# Patient Record
Sex: Male | Born: 1960 | Race: White | Hispanic: No | Marital: Married | Smoking: Current every day smoker
Health system: Southern US, Community
[De-identification: ages and names within clinical notes are randomized; demographics above are authoritative.]

## PROBLEM LIST (undated history)

## (undated) DIAGNOSIS — D649 Anemia, unspecified: Secondary | ICD-10-CM

## (undated) DIAGNOSIS — N62 Hypertrophy of breast: Secondary | ICD-10-CM

## (undated) DIAGNOSIS — R188 Other ascites: Secondary | ICD-10-CM

## (undated) DIAGNOSIS — B029 Zoster without complications: Secondary | ICD-10-CM

## (undated) DIAGNOSIS — C8198 Hodgkin lymphoma, unspecified, lymph nodes of multiple sites: Secondary | ICD-10-CM

## (undated) DIAGNOSIS — R918 Other nonspecific abnormal finding of lung field: Secondary | ICD-10-CM

## (undated) DIAGNOSIS — R945 Abnormal results of liver function studies: Secondary | ICD-10-CM

## (undated) DIAGNOSIS — I509 Heart failure, unspecified: Secondary | ICD-10-CM

## (undated) DIAGNOSIS — K920 Hematemesis: Secondary | ICD-10-CM

## (undated) DIAGNOSIS — N179 Acute kidney failure, unspecified: Secondary | ICD-10-CM

## (undated) DIAGNOSIS — R221 Localized swelling, mass and lump, neck: Secondary | ICD-10-CM

## (undated) DIAGNOSIS — R160 Hepatomegaly, not elsewhere classified: Secondary | ICD-10-CM

## (undated) DIAGNOSIS — K5730 Diverticulosis of large intestine without perforation or abscess without bleeding: Secondary | ICD-10-CM

## (undated) DIAGNOSIS — F1010 Alcohol abuse, uncomplicated: Secondary | ICD-10-CM

## (undated) DIAGNOSIS — M47812 Spondylosis without myelopathy or radiculopathy, cervical region: Secondary | ICD-10-CM

## (undated) HISTORY — PX: TONSILLECTOMY: SUR1361

---

## 2014-03-30 DIAGNOSIS — B029 Zoster without complications: Secondary | ICD-10-CM

## 2014-03-30 HISTORY — DX: Zoster without complications: B02.9

## 2014-04-20 ENCOUNTER — Ambulatory Visit (INDEPENDENT_AMBULATORY_CARE_PROVIDER_SITE_OTHER): Payer: BC Managed Care – PPO | Admitting: Physician Assistant

## 2014-04-20 VITALS — BP 128/76 | HR 93 | Temp 98.5°F | Resp 18 | Ht 73.0 in | Wt 189.8 lb

## 2014-04-20 DIAGNOSIS — B029 Zoster without complications: Secondary | ICD-10-CM

## 2014-04-20 MED ORDER — VALACYCLOVIR HCL 1 G PO TABS: 1000.0000 mg | ORAL_TABLET | Freq: Three times a day (TID) | ORAL | Status: DC

## 2014-04-20 NOTE — Progress Notes (Signed)
   Subjective:    Patient ID: Paul Morton, male    DOB: Aug 21, 1961, 53 y.o.   MRN: [id number]  HPI   Paul Morton is a very pleasant 53 yr old male here with concern for shingles.  Reports he had a "funny feeling" at the back of his neck 3 days ago - had been working outside over the weekend, thought perhaps his scraped something or had been bitten.  Two days ago he noted a splotchy red rash.  Yesterday blisters began to develop.  The area occ has burning pain but is not extremely painful.  He has not taken anything for pain.  He denies itching.  Rash does not cross the midline.  He denies fever or chills.  Feels under the weather, but no specific symptoms.     Review of Systems  Constitutional: Negative for fever and chills.  Respiratory: Negative.   Cardiovascular: Negative.   Gastrointestinal: Negative.   Skin: Positive for color change and rash.       Objective:   Physical Exam  Vitals reviewed. Constitutional: He is oriented to person, place, and time. He appears well-developed and well-nourished. No distress.  HENT:  Head: Normocephalic and atraumatic.  Eyes: Conjunctivae are normal. No scleral icterus.  Cardiovascular: Normal rate, regular rhythm and normal heart sounds.   Pulmonary/Chest: Effort normal and breath sounds normal. He has no wheezes. He has no rales.  Abdominal: Soft. Bowel sounds are normal. There is no tenderness. There is no rebound.  Neurological: He is alert and oriented to person, place, and time.  Skin: Skin is warm and dry. Rash noted. Rash is vesicular.     Noted area with numerous vesicular lesions on an erythematous base; does not cross the midline; appears c/w with zoster involving C3, C4, T1-2  Psychiatric: He has a normal mood and affect. His behavior is normal.       Assessment & Plan:  Shingles - Plan: valACYclovir (VALTREX) 1000 MG tablet   Paul Morton is a very pleasant 53 yr old male here with shingles.  Will start Valtrex 1g TID.  At this  point, he has good relief of pain with Aleve only - will continue with otc meds.  If becoming, more painful, can prescribe something stronger.  We discussed that the fluid within the vesicles is infectious - he will keep the rash covered until it is crusted over.    Pt to call or RTC if worsening or not improving  E. Natividad Brood MHS, PA-C [facility] 4/22/20159:52 PM

## 2014-04-20 NOTE — Patient Instructions (Signed)
Begin taking the Valtrex as directed.  Be sure to finish the full course.  This is an antiviral medicine that will hopefully shorten the duration of the rash  Continue using Aleve and/or Tylenol for pain relief.  If you need something stronger, please let me know  As we discussed, the fluid inside the blisters is infectious to someone who has never been exposed to chicken pox.  Once the blisters are crusted/scabbed over, they are no longer contagious  The rash will typically be crusted over in 7-10 days.  It may take a little longer than than for it to fully resolve  If you are worsening, not improving, or have other concerns, please let us know   Shingles Shingles (herpes zoster) is an infection that is caused by the same virus that causes chickenpox (varicella). The infection causes a painful skin rash and fluid-filled blisters, which eventually break open, crust over, and heal. It may occur in any area of the body, but it usually affects only one side of the body or face. The pain of shingles usually lasts about 1 month. However, some people with shingles may develop long-term (chronic) pain in the affected area of the body. Shingles often occurs many years after the person had chickenpox. It is more common:  In people older than 50 years.  In people with weakened immune systems, such as those with HIV, AIDS, or cancer.  In people taking medicines that weaken the immune system, such as transplant medicines.  In people under great stress. CAUSES  Shingles is caused by the varicella zoster virus (VZV), which also causes chickenpox. After a person is infected with the virus, it can remain in the person's body for years in an inactive state (dormant). To cause shingles, the virus reactivates and breaks out as an infection in a nerve root. The virus can be spread from person to person (contagious) through contact with open blisters of the shingles rash. It will only spread to people who have  not had chickenpox. When these people are exposed to the virus, they may develop chickenpox. They will not develop shingles. Once the blisters scab over, the person is no longer contagious and cannot spread the virus to others. SYMPTOMS  Shingles shows up in stages. The initial symptoms may be pain, itching, and tingling in an area of the skin. This pain is usually described as burning, stabbing, or throbbing.In a few days or weeks, a painful red rash will appear in the area where the pain, itching, and tingling were felt. The rash is usually on one side of the body in a band or belt-like pattern. Then, the rash usually turns into fluid-filled blisters. They will scab over and dry up in approximately 2 3 weeks. Flu-like symptoms may also occur with the initial symptoms, the rash, or the blisters. These may include:  Fever.  Chills.  Headache.  Upset stomach. DIAGNOSIS  Your caregiver will perform a skin exam to diagnose shingles. Skin scrapings or fluid samples may also be taken from the blisters. This sample will be examined under a microscope or sent to a lab for further testing. TREATMENT  There is no specific cure for shingles. Your caregiver will likely prescribe medicines to help you manage the pain, recover faster, and avoid long-term problems. This may include antiviral drugs, anti-inflammatory drugs, and pain medicines. HOME CARE INSTRUCTIONS   Take a cool bath or apply cool compresses to the area of the rash or blisters as directed. This may help  with the pain and itching.   Only take over-the-counter or prescription medicines as directed by your caregiver.   Rest as directed by your caregiver.  Keep your rash and blisters clean with mild soap and cool water or as directed by your caregiver.  Do not pick your blisters or scratch your rash. Apply an anti-itch cream or numbing creams to the affected area as directed by your caregiver.  Keep your shingles rash covered with a  loose bandage (dressing).  Avoid skin contact with:  Babies.   Pregnant women.   Children with eczema.   Elderly people with transplants.   People with chronic illnesses, such as leukemia or AIDS.   Wear loose-fitting clothing to help ease the pain of material rubbing against the rash.  Keep all follow-up appointments with your caregiver.If the area involved is on your face, you may receive a referral for follow-up to a specialist, such as an eye doctor (ophthalmologist) or an ear, nose, and throat (ENT) doctor. Keeping all follow-up appointments will help you avoid eye complications, chronic pain, or disability.  SEEK IMMEDIATE MEDICAL CARE IF:   You have facial pain, pain around the eye area, or loss of feeling on one side of your face.  You have ear pain or ringing in your ear.  You have loss of taste.  Your pain is not relieved with prescribed medicines.   Your redness or swelling spreads.   You have more pain and swelling.  Your condition is worsening or has changed.   You have a feveror persistent symptoms for more than 2 3 days.  You have a fever and your symptoms suddenly get worse. MAKE SURE YOU:  Understand these instructions.  Will watch your condition.  Will get help right away if you are not doing well or get worse. Document Released: 12/16/2005 Document Revised: 09/09/2012 Document Reviewed: 07/30/2012 [facility] Patient Information 2014 [place].

## 2015-05-31 DIAGNOSIS — R221 Localized swelling, mass and lump, neck: Secondary | ICD-10-CM

## 2015-05-31 HISTORY — DX: Localized swelling, mass and lump, neck: R22.1

## 2015-08-31 DIAGNOSIS — R7989 Other specified abnormal findings of blood chemistry: Secondary | ICD-10-CM

## 2015-08-31 DIAGNOSIS — I509 Heart failure, unspecified: Secondary | ICD-10-CM

## 2015-08-31 DIAGNOSIS — K5730 Diverticulosis of large intestine without perforation or abscess without bleeding: Secondary | ICD-10-CM

## 2015-08-31 DIAGNOSIS — N179 Acute kidney failure, unspecified: Secondary | ICD-10-CM

## 2015-08-31 DIAGNOSIS — R188 Other ascites: Secondary | ICD-10-CM

## 2015-08-31 DIAGNOSIS — D649 Anemia, unspecified: Secondary | ICD-10-CM

## 2015-08-31 DIAGNOSIS — N62 Hypertrophy of breast: Secondary | ICD-10-CM

## 2015-08-31 DIAGNOSIS — R160 Hepatomegaly, not elsewhere classified: Secondary | ICD-10-CM

## 2015-08-31 DIAGNOSIS — F1010 Alcohol abuse, uncomplicated: Secondary | ICD-10-CM

## 2015-08-31 DIAGNOSIS — M47812 Spondylosis without myelopathy or radiculopathy, cervical region: Secondary | ICD-10-CM

## 2015-08-31 DIAGNOSIS — R918 Other nonspecific abnormal finding of lung field: Secondary | ICD-10-CM

## 2015-08-31 HISTORY — DX: Acute kidney failure, unspecified: N17.9

## 2015-08-31 HISTORY — DX: Hypertrophy of breast: N62

## 2015-08-31 HISTORY — DX: Spondylosis without myelopathy or radiculopathy, cervical region: M47.812

## 2015-08-31 HISTORY — DX: Hepatomegaly, not elsewhere classified: R16.0

## 2015-08-31 HISTORY — DX: Other ascites: R18.8

## 2015-08-31 HISTORY — DX: Anemia, unspecified: D64.9

## 2015-08-31 HISTORY — DX: Alcohol abuse, uncomplicated: F10.10

## 2015-08-31 HISTORY — DX: Other nonspecific abnormal finding of lung field: R91.8

## 2015-08-31 HISTORY — DX: Other specified abnormal findings of blood chemistry: R79.89

## 2015-08-31 HISTORY — DX: Heart failure, unspecified: I50.9

## 2015-08-31 HISTORY — DX: Diverticulosis of large intestine without perforation or abscess without bleeding: K57.30

## 2015-09-08 ENCOUNTER — Other Ambulatory Visit: Payer: Self-pay | Admitting: Hematology & Oncology

## 2015-09-08 ENCOUNTER — Ambulatory Visit (INDEPENDENT_AMBULATORY_CARE_PROVIDER_SITE_OTHER): Payer: BLUE CROSS/BLUE SHIELD | Admitting: Emergency Medicine

## 2015-09-08 ENCOUNTER — Ambulatory Visit (HOSPITAL_COMMUNITY)
Admission: RE | Admit: 2015-09-08 | Discharge: 2015-09-08 | Disposition: A | Discharge status: Home / Self Care | Payer: BLUE CROSS/BLUE SHIELD | Source: Ambulatory Visit | Attending: Emergency Medicine | Admitting: Emergency Medicine

## 2015-09-08 ENCOUNTER — Encounter (HOSPITAL_COMMUNITY): Payer: Self-pay

## 2015-09-08 VITALS — BP 116/82 | HR 127 | Temp 99.1°F | Resp 18 | Ht 72.0 in | Wt 199.6 lb

## 2015-09-08 DIAGNOSIS — R221 Localized swelling, mass and lump, neck: Secondary | ICD-10-CM

## 2015-09-08 DIAGNOSIS — R222 Localized swelling, mass and lump, trunk: Secondary | ICD-10-CM

## 2015-09-08 DIAGNOSIS — S335XXA Sprain of ligaments of lumbar spine, initial encounter: Secondary | ICD-10-CM

## 2015-09-08 DIAGNOSIS — J9859 Other diseases of mediastinum, not elsewhere classified: Secondary | ICD-10-CM

## 2015-09-08 LAB — COMPREHENSIVE METABOLIC PANEL
ALBUMIN: 3.2 g/dL — AB (ref 3.6–5.1)
ALT: 337 U/L — ABNORMAL HIGH (ref 9–46)
AST: 302 U/L — ABNORMAL HIGH (ref 10–35)
Alkaline Phosphatase: 528 U/L — ABNORMAL HIGH (ref 40–115)
BUN: 17 mg/dL (ref 7–25)
CHLORIDE: 102 mmol/L (ref 98–110)
CO2: 25 mmol/L (ref 20–31)
Calcium: 9.3 mg/dL (ref 8.6–10.3)
Creat: 1 mg/dL (ref 0.70–1.33)
Glucose, Bld: 102 mg/dL — ABNORMAL HIGH (ref 65–99)
Potassium: 5.2 mmol/L (ref 3.5–5.3)
SODIUM: 136 mmol/L (ref 135–146)
Total Bilirubin: 1.3 mg/dL — ABNORMAL HIGH (ref 0.2–1.2)
Total Protein: 6.6 g/dL (ref 6.1–8.1)

## 2015-09-08 LAB — POCT CBC
GRANULOCYTE PERCENT: 83.8 % — AB (ref 37–80)
HEMATOCRIT: 32.3 % — AB (ref 43.5–53.7)
Hemoglobin: 10.1 g/dL — AB (ref 14.1–18.1)
Lymph, poc: 1.7 (ref 0.6–3.4)
MCH, POC: 24.3 pg — AB (ref 27–31.2)
MCHC: 31.2 g/dL — AB (ref 31.8–35.4)
MCV: 78 fL — AB (ref 80–97)
MID (cbc): 0.8 (ref 0–0.9)
MPV: 6.5 fL (ref 0–99.8)
POC GRANULOCYTE: 12.5 — AB (ref 2–6.9)
POC LYMPH PERCENT: 11.1 %L (ref 10–50)
POC MID %: 5.1 %M (ref 0–12)
Platelet Count, POC: 716 10*3/uL — AB (ref 142–424)
RBC: 4.14 M/uL — AB (ref 4.69–6.13)
RDW, POC: 17.5 %
WBC: 14.9 10*3/uL — AB (ref 4.6–10.2)

## 2015-09-08 LAB — TSH: TSH: 3.279 u[IU]/mL (ref 0.350–4.500)

## 2015-09-08 MED ORDER — IOHEXOL 300 MG/ML  SOLN
75.0000 mL | Freq: Once | INTRAMUSCULAR | Status: AC | PRN
  Administered 2015-09-08: 75 mL via INTRAVENOUS

## 2015-09-08 MED ORDER — CYCLOBENZAPRINE HCL 10 MG PO TABS: 10.0000 mg | ORAL_TABLET | Freq: Three times a day (TID) | ORAL | Status: AC | PRN

## 2015-09-08 MED ORDER — EPINEPHRINE 0.3 MG/0.3ML IJ SOAJ: 0.3000 mg | Freq: Once | INTRAMUSCULAR | Status: AC

## 2015-09-08 MED ORDER — TRAMADOL HCL 50 MG PO TABS: 50.0000 mg | ORAL_TABLET | Freq: Three times a day (TID) | ORAL | Status: AC | PRN

## 2015-09-08 MED ORDER — NAPROXEN SODIUM 550 MG PO TABS: 550.0000 mg | ORAL_TABLET | Freq: Two times a day (BID) | ORAL | Status: AC

## 2015-09-08 NOTE — Progress Notes (Signed)
Subjective:  Patient ID: Paul Morton, male    DOB: 03-Jul-1961  Age: 54 y.o. MRN: [id number]  CC: Back Pain and Mass   HPI [name] presents  with low back pain over the last month. He has no numbness tingling or weakness. No radiation of pain. He has no history of acute injury or overuse. No history of back pain previously. He has no incontinency or stool.  "Oh by the way "I have a neck mass. This been present for the last 3 months. Is not tender or painful. Seems to be enlarged. No fever chills no weight loss.  History [name] has a past medical history of Allergy.   He has no past surgical history on file.   His  family history is not on file.  He   reports that he has been smoking Cigarettes.  He has been smoking about 1.00 pack per day. He does not have any smokeless tobacco history on file. He reports that he drinks alcohol. He reports that he does not use illicit drugs.  Outpatient Prescriptions Prior to Visit  Medication Sig Dispense Refill  . Naproxen Sodium (ALEVE PO) Take by mouth.    . valACYclovir (VALTREX) 1000 MG tablet Take 1 tablet (1,000 mg total) by mouth 3 (three) times daily. (Patient not taking: Reported on 09/08/2015) 21 tablet 0   No facility-administered medications prior to visit.    Social History   Social History  . Marital Status: Married    Spouse Name: N/A  . Number of Children: N/A  . Years of Education: N/A   Social History Main Topics  . Smoking status: Current Every Day Smoker -- 1.00 packs/day    Types: Cigarettes  . Smokeless tobacco: None  . Alcohol Use: Yes  . Drug Use: No  . Sexual Activity: Not Asked   Other Topics Concern  . None   Social History Narrative     Review of Systems  Constitutional: Negative for fever, chills and appetite change.  HENT: Negative for congestion, ear pain, postnasal drip, sinus pressure and sore throat.   Eyes: Negative for pain and redness.  Respiratory: Negative for cough,  shortness of breath and wheezing.   Cardiovascular: Negative for leg swelling.  Gastrointestinal: Negative for nausea, vomiting, abdominal pain, diarrhea, constipation and blood in stool.  Endocrine: Negative for polyuria.  Genitourinary: Negative for dysuria, urgency, frequency and flank pain.  Musculoskeletal: Positive for back pain. Negative for gait problem.  Skin: Negative for rash.  Neurological: Negative for weakness and headaches.  Psychiatric/Behavioral: Negative for confusion and decreased concentration. The patient is not nervous/anxious.     Objective:  BP 116/82 mmHg  Pulse 127  Temp(Src) 99.1 F (37.3 C) (Oral)  Resp 18  Ht 6' (1.829 m)  Wt 199 lb 9.6 oz (90.538 kg)  BMI 27.06 kg/m2  SpO2 98%  Physical Exam  Constitutional: He is oriented to person, place, and time. He appears well-developed and well-nourished.  HENT:  Head: Normocephalic and atraumatic.  Eyes: Conjunctivae are normal. Pupils are equal, round, and reactive to light.  Neck: Normal range of motion and phonation normal. Neck supple. Carotid bruit is not present. No thyroid mass and no thyromegaly present.  He has a grapefruit size firm mass that is not fixed to the underlying tissues were fixed to the skin in his left supraclavicular fossa and lateral base of the neck. It is not pulsatile. Is not tender. Multilobular in character. Seems to arise from the supraclavicular  fossa  Pulmonary/Chest: Effort normal.  Musculoskeletal: He exhibits no edema.  Neurological: He is alert and oriented to person, place, and time.  Skin: Skin is dry.  Psychiatric: He has a normal mood and affect. His behavior is normal. Thought content normal.      Assessment & Plan:   Ryder was seen today for back pain and mass.  Diagnoses and all orders for this visit:  Neck mass -     CT Soft Tissue Neck W Contrast; Future -     POCT CBC -     Comprehensive metabolic panel -     TSH  Sprain of lumbar region, initial  encounter  Other orders -     EPINEPHrine 0.3 mg/0.3 mL IJ SOAJ injection; Inject 0.3 mLs (0.3 mg total) into the muscle once. -     naproxen sodium (ANAPROX DS) 550 MG tablet; Take 1 tablet (550 mg total) by mouth 2 (two) times daily with a meal. -     cyclobenzaprine (FLEXERIL) 10 MG tablet; Take 1 tablet (10 mg total) by mouth 3 (three) times daily as needed for muscle spasms. -     traMADol (ULTRAM) 50 MG tablet; Take 1 tablet (50 mg total) by mouth every 8 (eight) hours as needed.   I am having [name] start on EPINEPHrine, naproxen sodium, cyclobenzaprine, and traMADol. I am also having him maintain his Naproxen Sodium (ALEVE PO) and valACYclovir.  Meds ordered this encounter  Medications  . EPINEPHrine 0.3 mg/0.3 mL IJ SOAJ injection    Sig: Inject 0.3 mLs (0.3 mg total) into the muscle once.    Dispense:  2 Device    Refill:  2  . naproxen sodium (ANAPROX DS) 550 MG tablet    Sig: Take 1 tablet (550 mg total) by mouth 2 (two) times daily with a meal.    Dispense:  40 tablet    Refill:  0  . cyclobenzaprine (FLEXERIL) 10 MG tablet    Sig: Take 1 tablet (10 mg total) by mouth 3 (three) times daily as needed for muscle spasms.    Dispense:  30 tablet    Refill:  0  . traMADol (ULTRAM) 50 MG tablet    Sig: Take 1 tablet (50 mg total) by mouth every 8 (eight) hours as needed.    Dispense:  40 tablet    Refill:  0   I discussed the case with Dr. Burney Gauze who said that he would communicate with the patient directly and see him Monday in the office. I did talk with the patient prior to talking with the oncologist and informed him of the results of the testing and that we be doing a referral he was amenable but concerned about the diagnosis.  Appropriate red flag conditions were discussed with the patient as well as actions that should be taken.  Patient expressed his understanding.  Follow-up: Return if symptoms worsen or fail to improve.  Roselee Culver,  MD   Results for orders placed or performed in visit on 09/08/15  POCT CBC  Result Value Ref Range   WBC 14.9 (A) 4.6 - 10.2 K/uL   Lymph, poc 1.7 0.6 - 3.4   POC LYMPH PERCENT 11.1 10 - 50 %L   MID (cbc) 0.8 0 - 0.9   POC MID % 5.1 0 - 12 %M   POC Granulocyte 12.5 (A) 2 - 6.9   Granulocyte percent 83.8 (A) 37 - 80 %G   RBC 4.14 (A)  4.69 - 6.13 M/uL   Hemoglobin 10.1 (A) 14.1 - 18.1 g/dL   HCT, POC 32.3 (A) 43.5 - 53.7 %   MCV 78.0 (A) 80 - 97 fL   MCH, POC 24.3 (A) 27 - 31.2 pg   MCHC 31.2 (A) 31.8 - 35.4 g/dL   RDW, POC 17.5 %   Platelet Count, POC 716 (A) 142 - 424 K/uL   MPV 6.5 0 - 99.8 fL

## 2015-09-08 NOTE — Patient Instructions (Addendum)
Go for your CT of your neck today at 11:30. Arrive at 11:15am. CT is at [place] through the ER entrance and register as an outpatient.   Lumbosacral Strain Lumbosacral strain is a strain of any of the parts that make up your lumbosacral vertebrae. Your lumbosacral vertebrae are the bones that make up the lower third of your backbone. Your lumbosacral vertebrae are held together by muscles and tough, fibrous tissue (ligaments).  CAUSES  A sudden blow to your back can cause lumbosacral strain. Also, anything that causes an excessive stretch of the muscles in the low back can cause this strain. This is typically seen when people exert themselves strenuously, fall, lift heavy objects, bend, or crouch repeatedly. RISK FACTORS  Physically demanding work.  Participation in pushing or pulling sports or sports that require a sudden twist of the back (tennis, golf, baseball).  Weight lifting.  Excessive lower back curvature.  Forward-tilted pelvis.  Weak back or abdominal muscles or both.  Tight hamstrings. SIGNS AND SYMPTOMS  Lumbosacral strain may cause pain in the area of your injury or pain that moves (radiates) down your leg.  DIAGNOSIS Your health care provider can often diagnose lumbosacral strain through a physical exam. In some cases, you may need tests such as X-ray exams.  TREATMENT  Treatment for your lower back injury depends on many factors that your clinician will have to evaluate. However, most treatment will include the use of anti-inflammatory medicines. HOME CARE INSTRUCTIONS   Avoid hard physical activities (tennis, racquetball, waterskiing) if you are not in proper physical condition for it. This may aggravate or create problems.  If you have a back problem, avoid sports requiring sudden body movements. Swimming and walking are generally safer activities.  Maintain good posture.  Maintain a healthy weight.  For acute conditions, you may put ice on the  injured area.  Put ice in a plastic bag.  Place a towel between your skin and the bag.  Leave the ice on for 20 minutes, 2-3 times a day.  When the low back starts healing, stretching and strengthening exercises may be recommended. SEEK MEDICAL CARE IF:  Your back pain is getting worse.  You experience severe back pain not relieved with medicines. SEEK IMMEDIATE MEDICAL CARE IF:   You have numbness, tingling, weakness, or problems with the use of your arms or legs.  There is a change in bowel or bladder control.  You have increasing pain in any area of the body, including your belly (abdomen).  You notice shortness of breath, dizziness, or feel faint.  You feel sick to your stomach (nauseous), are throwing up (vomiting), or become sweaty.  You notice discoloration of your toes or legs, or your feet get very cold. MAKE SURE YOU:   Understand these instructions.  Will watch your condition.  Will get help right away if you are not doing well or get worse. Document Released: 09/25/2005 Document Revised: 12/21/2013 Document Reviewed: 08/04/2013 [facility] Patient Information 2015 [place], [place]. This information is not intended to replace advice given to you by your health care provider. Make sure you discuss any questions you have with your health care provider.

## 2015-09-11 ENCOUNTER — Ambulatory Visit: Payer: BLUE CROSS/BLUE SHIELD

## 2015-09-12 ENCOUNTER — Other Ambulatory Visit (HOSPITAL_BASED_OUTPATIENT_CLINIC_OR_DEPARTMENT_OTHER): Payer: BLUE CROSS/BLUE SHIELD

## 2015-09-12 ENCOUNTER — Telehealth: Payer: Self-pay | Admitting: *Deleted

## 2015-09-12 ENCOUNTER — Ambulatory Visit (HOSPITAL_BASED_OUTPATIENT_CLINIC_OR_DEPARTMENT_OTHER): Payer: BLUE CROSS/BLUE SHIELD | Admitting: Family

## 2015-09-12 ENCOUNTER — Other Ambulatory Visit: Payer: Self-pay | Admitting: *Deleted

## 2015-09-12 ENCOUNTER — Encounter: Payer: Self-pay | Admitting: Family

## 2015-09-12 ENCOUNTER — Ambulatory Visit: Payer: BLUE CROSS/BLUE SHIELD

## 2015-09-12 VITALS — BP 115/77 | HR 121 | Temp 97.9°F | Resp 18 | Ht 72.0 in | Wt 200.0 lb

## 2015-09-12 DIAGNOSIS — R221 Localized swelling, mass and lump, neck: Secondary | ICD-10-CM | POA: Diagnosis not present

## 2015-09-12 DIAGNOSIS — R7989 Other specified abnormal findings of blood chemistry: Secondary | ICD-10-CM

## 2015-09-12 DIAGNOSIS — R591 Generalized enlarged lymph nodes: Secondary | ICD-10-CM

## 2015-09-12 DIAGNOSIS — R590 Localized enlarged lymph nodes: Secondary | ICD-10-CM

## 2015-09-12 DIAGNOSIS — C8590 Non-Hodgkin lymphoma, unspecified, unspecified site: Secondary | ICD-10-CM

## 2015-09-12 LAB — CBC WITH DIFFERENTIAL (CANCER CENTER ONLY)
BASO#: 0 10*3/uL (ref 0.0–0.2)
BASO%: 0.2 % (ref 0.0–2.0)
EOS%: 0.6 % (ref 0.0–7.0)
Eosinophils Absolute: 0.1 10*3/uL (ref 0.0–0.5)
HCT: 30.6 % — ABNORMAL LOW (ref 38.7–49.9)
HEMOGLOBIN: 9.9 g/dL — AB (ref 13.0–17.1)
LYMPH#: 1 10*3/uL (ref 0.9–3.3)
LYMPH%: 7.8 % — AB (ref 14.0–48.0)
MCH: 25.4 pg — ABNORMAL LOW (ref 28.0–33.4)
MCHC: 32.4 g/dL (ref 32.0–35.9)
MCV: 79 fL — AB (ref 82–98)
MONO#: 1.4 10*3/uL — AB (ref 0.1–0.9)
MONO%: 10.9 % (ref 0.0–13.0)
NEUT%: 80.5 % — ABNORMAL HIGH (ref 40.0–80.0)
NEUTROS ABS: 10.7 10*3/uL — AB (ref 1.5–6.5)
PLATELETS: 528 10*3/uL — AB (ref 145–400)
RBC: 3.89 10*6/uL — AB (ref 4.20–5.70)
RDW: 16.6 % — ABNORMAL HIGH (ref 11.1–15.7)
WBC: 13.3 10*3/uL — AB (ref 4.0–10.0)

## 2015-09-12 LAB — CMP (CANCER CENTER ONLY)
ALBUMIN: 2.8 g/dL — AB (ref 3.3–5.5)
ALT: 382 U/L — AB (ref 10–47)
AST: 230 U/L — AB (ref 11–38)
Alkaline Phosphatase: 503 U/L — ABNORMAL HIGH (ref 26–84)
BILIRUBIN TOTAL: 1.1 mg/dL (ref 0.20–1.60)
BUN, Bld: 20 mg/dL (ref 7–22)
CO2: 25 meq/L (ref 18–33)
CREATININE: 1.2 mg/dL (ref 0.6–1.2)
Calcium: 9.8 mg/dL (ref 8.0–10.3)
Chloride: 98 mEq/L (ref 98–108)
GLUCOSE: 92 mg/dL (ref 73–118)
Potassium: 4.6 mEq/L (ref 3.3–4.7)
SODIUM: 136 meq/L (ref 128–145)
Total Protein: 7.3 g/dL (ref 6.4–8.1)

## 2015-09-12 LAB — IRON AND TIBC CHCC
%SAT: 5 % — AB (ref 20–55)
Iron: 12 ug/dL — ABNORMAL LOW (ref 42–163)
TIBC: 238 ug/dL (ref 202–409)
UIBC: 226 ug/dL (ref 117–376)

## 2015-09-12 LAB — FERRITIN CHCC: Ferritin: 350 ng/ml — ABNORMAL HIGH (ref 22–316)

## 2015-09-12 LAB — TECHNOLOGIST REVIEW CHCC SATELLITE

## 2015-09-12 NOTE — Progress Notes (Signed)
Hematology/Oncology Consultation   Name: Ronon Ferger      MRN: [id number]    Location: Room/bed info not found  Date: 09/12/2015 Time:9:17 AM   REFERRING PHYSICIAN: Janalee Dane. Ouida Sills, MD  REASON FOR CONSULT: Neck mass   DIAGNOSIS: Left neck mass with cervical adenopathy   HISTORY OF PRESENT ILLNESS: Mr. Stfort is a very pleasant 54 yo white male with an impressive left neck mass and cervical lymphadenopathy. He states that "it has been there for at least 3 months." CT scan of the neck revealed numerous soft tissue masses in the supraclavicular left side and and deep to the sternocleidomastoid musculature. He denies pain from this and denies having any problems swallowing.  He is anemic with a Hgb of 9.9 and MCV of 79. We checked iron studies on him. The results are pending.  No fever, chills, n/v, cough, rash, dizziness, chest pain, palpitations, abdominal pain, constipation or diarrhea. He has not noticed any blood in his urine or stool.  He has never had a colonoscopy. He would prefer to do a hemoccult test at home so we are send cards home with him. His wife is a Marine scientist and will help him with this.  He is a smoker and smokes 1 ppd. He has some SOB with exertion at times. His father passed away from lung cancer and was also a smoker.  He has no other family history of cancer or anemia.  He does drink 6 beers a day. His liver enzymes are quite elevated.  He does have some lower back pain. He states that he pulled a muscle in his back. He has no swelling, numbness or tingling in his extremities. No c/o "boney" pain.  His appetite is decreased. He is staying hydrated. He denies any significant weight loss or gain.  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  He has one child and they are healthy.   ROS: All other 10 point review of systems is negative.   PAST MEDICAL HISTORY:   Past Medical History  Diagnosis Date  . Allergy     ALLERGIES: Allergies   Allergen Reactions  . Peanut-Containing Drug Products       MEDICATIONS:  Current Outpatient Prescriptions on File Prior to Visit  Medication Sig Dispense Refill  . cyclobenzaprine (FLEXERIL) 10 MG tablet Take 1 tablet (10 mg total) by mouth 3 (three) times daily as needed for muscle spasms. 30 tablet 0  . EPINEPHrine 0.3 mg/0.3 mL IJ SOAJ injection Inject 0.3 mLs (0.3 mg total) into the muscle once. 2 Device 2  . Naproxen Sodium (ALEVE PO) Take by mouth.    . naproxen sodium (ANAPROX DS) 550 MG tablet Take 1 tablet (550 mg total) by mouth 2 (two) times daily with a meal. 40 tablet 0  . traMADol (ULTRAM) 50 MG tablet Take 1 tablet (50 mg total) by mouth every 8 (eight) hours as needed. 40 tablet 0  . valACYclovir (VALTREX) 1000 MG tablet Take 1 tablet (1,000 mg total) by mouth 3 (three) times daily. (Patient not taking: Reported on 09/08/2015) 21 tablet 0   No current facility-administered medications on file prior to visit.     PAST SURGICAL HISTORY No past surgical history on file.  FAMILY HISTORY: No family history on file.  SOCIAL HISTORY:  reports that he has been smoking Cigarettes.  He has been smoking about 1.00 pack per day. He does not have any smokeless tobacco history on file. He reports  that he drinks alcohol. He reports that he does not use illicit drugs.  PERFORMANCE STATUS: The patient's performance status is 1 - Symptomatic but completely ambulatory  PHYSICAL EXAM: Most Recent Vital Signs: There were no vitals taken for this visit. BP 115/77 mmHg  Pulse 121  Temp(Src) 97.9 F (36.6 C) (Oral)  Resp 18  Ht 6' (1.829 m)  Wt 200 lb (90.719 kg)  BMI 27.12 kg/m2  General Appearance:    Alert, cooperative, no distress, appears stated age  Head:    Normocephalic, without obvious abnormality, atraumatic  Eyes:    PERRL, conjunctiva/corneas clear, EOM's intact, fundi    benign, both eyes             Throat:   Lips, mucosa, and tongue normal; teeth and gums normal   Neck:   Supple, trachea midline, has cervical adenopathy and mass on left side of neck   thyroid:  No enlargement/tenderness/nodules; no carotid   bruit or JVD  Back:     Symmetric, no curvature, ROM normal, no CVA tenderness  Lungs:     Clear to auscultation bilaterally, respirations unlabored  Chest wall:    No tenderness or deformity  Heart:    Regular rate and rhythm, S1 and S2 normal, no murmur, rub   or gallop  Abdomen:     Soft, non-tender, bowel sounds active all four quadrants,    no masses, no organomegaly        Extremities:   Extremities normal, atraumatic, no cyanosis or edema  Pulses:   2+ and symmetric all extremities  Skin:   Skin color, texture, turgor normal, no rashes or lesions  Lymph nodes:   axillary nodes normal  Neurologic:   CNII-XII intact. Normal strength, sensation and reflexes      throughout   LABORATORY DATA:  No results found for this or any previous visit (from the past 48 hour(s)).    RADIOGRAPHY: No results found.     PATHOLOGY: None  ASSESSMENT/PLAN: Mr. Gittleman is a very pleasant 54 yo white male with a mobile left neck mass and cervical lymphadenopathy for over 3 months. CT scan of the neck revealed numerous soft tissue masses in the supraclavicular left side and and deep to the sternocleidomastoid musculature.  So far this has not effected his ability to swallow.  We will set him up for a CT guided biopsy of this mass next week.  We will also get him scheduled for a PET scan in the next week.  He is anemic and may need a dose of Feraheme. We will see what his iron studies show.   His LFTs are also significantly elevated at this time. He has cards to hemoccult test his stool at home (his preference).  We will schedule his follow-up once we receive his test results.  All questions were answered. Both the patient and his wife know to contact us with any problems, questions or concerns. We can certainly see him much sooner if necessary.  He was  discussed with and also seen by Dr. Marin Olp and he is in agreement with the aforementioned.   [facility]    Addendum:  I saw and examined the patient with Lucee Brissett. I am a little worried with the fact that he is anemic. His LFTs are also quite elevated. He's never had a colonoscopy. As such, I worried that there might be something else going on with him. It is certainly possible that the lymph node in the left neck  could be metastatic from an abdominal cancer.  He will be incredibly interesting to see what the biopsy shows.  We will see what the PET scan also shows.  He is in dire need of a colonoscopy. His iron studies clearly show iron deficiency. I taught him about this. I gave him some stool cards. His wife is actually a nurse so she can help [place] out with this.  One would have to suspect that we are looking at Hodgkin's or non-Hodgkin's lymphoma. However, we might have another problem.  His CEA is normal so that would make metastatic colon cancer on likely.  It would be unusual for lymphoma to metastasize to the bowel but that is not unheard of.  We spent most an hour with he and his wife. He is very nice. He is [place] so it was fun talking to him about [place] since I have lived up there in the past.  We will plan to get him back once we get the results back from our biopsies and radiologic studies.  Pete E.

## 2015-09-12 NOTE — Progress Notes (Signed)
Patient will receive flu vaccine from employer.

## 2015-09-12 NOTE — Telephone Encounter (Signed)
Critical Value AST 230  ALT 382 Dr Marin Olp aware. No orders received

## 2015-09-13 ENCOUNTER — Telehealth: Payer: Self-pay | Admitting: Hematology & Oncology

## 2015-09-13 NOTE — Telephone Encounter (Signed)
I spoke w Tre today w [employer] and she advised, NPR for [place] members.  Only [place], [place], [place] residence.    Ref: [id number]   P: [phone] - NIA

## 2015-09-14 LAB — BETA 2 MICROGLOBULIN, SERUM: BETA 2 MICROGLOBULIN: 3.5 mg/L — AB (ref <=2.51)

## 2015-09-14 LAB — LACTATE DEHYDROGENASE: LDH: 299 U/L — AB (ref 94–250)

## 2015-09-14 LAB — SEDIMENTATION RATE: Sed Rate: 38 mm/hr — ABNORMAL HIGH (ref 0–20)

## 2015-09-14 LAB — CEA: CEA: 1.6 ng/mL (ref 0.0–5.0)

## 2015-09-15 ENCOUNTER — Other Ambulatory Visit: Payer: Self-pay | Admitting: Radiology

## 2015-09-18 ENCOUNTER — Ambulatory Visit (HOSPITAL_COMMUNITY): Admission: RE | Admit: 2015-09-18 | Payer: BLUE CROSS/BLUE SHIELD | Source: Ambulatory Visit

## 2015-09-21 ENCOUNTER — Ambulatory Visit (HOSPITAL_COMMUNITY)
Admission: RE | Admit: 2015-09-21 | Discharge: 2015-09-21 | Disposition: A | Discharge status: Home / Self Care | Payer: BLUE CROSS/BLUE SHIELD | Source: Ambulatory Visit | Attending: Family | Admitting: Family

## 2015-09-21 DIAGNOSIS — J90 Pleural effusion, not elsewhere classified: Secondary | ICD-10-CM | POA: Insufficient documentation

## 2015-09-21 DIAGNOSIS — I517 Cardiomegaly: Secondary | ICD-10-CM | POA: Insufficient documentation

## 2015-09-21 DIAGNOSIS — K4090 Unilateral inguinal hernia, without obstruction or gangrene, not specified as recurrent: Secondary | ICD-10-CM | POA: Insufficient documentation

## 2015-09-21 DIAGNOSIS — K5730 Diverticulosis of large intestine without perforation or abscess without bleeding: Secondary | ICD-10-CM | POA: Insufficient documentation

## 2015-09-21 DIAGNOSIS — R160 Hepatomegaly, not elsewhere classified: Secondary | ICD-10-CM | POA: Diagnosis not present

## 2015-09-21 DIAGNOSIS — I700 Atherosclerosis of aorta: Secondary | ICD-10-CM | POA: Diagnosis not present

## 2015-09-21 DIAGNOSIS — R590 Localized enlarged lymph nodes: Secondary | ICD-10-CM | POA: Insufficient documentation

## 2015-09-21 DIAGNOSIS — N62 Hypertrophy of breast: Secondary | ICD-10-CM | POA: Diagnosis not present

## 2015-09-21 DIAGNOSIS — J432 Centrilobular emphysema: Secondary | ICD-10-CM | POA: Diagnosis not present

## 2015-09-21 DIAGNOSIS — K429 Umbilical hernia without obstruction or gangrene: Secondary | ICD-10-CM | POA: Insufficient documentation

## 2015-09-21 DIAGNOSIS — R601 Generalized edema: Secondary | ICD-10-CM | POA: Insufficient documentation

## 2015-09-21 DIAGNOSIS — K7689 Other specified diseases of liver: Secondary | ICD-10-CM | POA: Diagnosis not present

## 2015-09-21 LAB — GLUCOSE, CAPILLARY: GLUCOSE-CAPILLARY: 95 mg/dL (ref 65–99)

## 2015-09-21 MED ORDER — FLUDEOXYGLUCOSE F - 18 (FDG) INJECTION
9.1000 | Freq: Once | INTRAVENOUS | Status: DC | PRN
Start: 2015-09-21 — End: 2015-09-27
  Administered 2015-09-21: 9.1 via INTRAVENOUS
  Filled 2015-09-21: qty 9.1

## 2015-09-22 ENCOUNTER — Ambulatory Visit (INDEPENDENT_AMBULATORY_CARE_PROVIDER_SITE_OTHER): Payer: BLUE CROSS/BLUE SHIELD | Admitting: Physician Assistant

## 2015-09-22 ENCOUNTER — Ambulatory Visit (HOSPITAL_COMMUNITY)
Admission: RE | Admit: 2015-09-22 | Discharge: 2015-09-22 | Disposition: A | Discharge status: Home / Self Care | Payer: BLUE CROSS/BLUE SHIELD | Source: Ambulatory Visit | Attending: Family | Admitting: Family

## 2015-09-22 VITALS — BP 103/72 | HR 122 | Resp 18 | Ht 72.0 in | Wt 193.0 lb

## 2015-09-22 DIAGNOSIS — R590 Localized enlarged lymph nodes: Secondary | ICD-10-CM

## 2015-09-22 DIAGNOSIS — M545 Low back pain, unspecified: Secondary | ICD-10-CM

## 2015-09-22 MED ORDER — HYDROCODONE-IBUPROFEN 5-200 MG PO TABS: 1.0000 | ORAL_TABLET | Freq: Three times a day (TID) | ORAL | Status: DC | PRN

## 2015-09-22 MED ORDER — PREDNISONE 20 MG PO TABS: ORAL_TABLET | ORAL | Status: AC

## 2015-09-22 MED ORDER — LIDOCAINE HCL (PF) 1 % IJ SOLN
INTRAMUSCULAR | Status: AC
  Filled 2015-09-22: qty 10

## 2015-09-22 NOTE — Progress Notes (Addendum)
09/22/2015 at 5:16 PM  Paul Morton / DOB: [birth year] / MRN: [id number]  The patient  does not have a problem list on file.  SUBJECTIVE  Paul Morton is a 54 y.o. cachetic male presenting for the chief complaint of bilateral lower back pain x one month.  Was initially seen roughly 1 month ago and was prescribed tramadol, Naprosyn, and flexeril and now feels that his back is worse. He is unable to work due to the pain. He has recently been worked up for a left sided neck mass and there is concern for lymphoma.  He is currently awaiting biopsy.       He  has a past medical history of Allergy.    Medications reviewed and updated by myself where necessary, and exist elsewhere in the encounter.   [name] is allergic to peanut-containing drug products. He  reports that he has been smoking Cigarettes.  He has been smoking about 1.00 pack per day. He does not have any smokeless tobacco history on file. He reports that he drinks alcohol. He reports that he does not use illicit drugs. He  has no sexual activity history on file. The patient  has no past surgical history on file.  His family history is not on file.  Review of Systems  Constitutional: Negative for fever.  Respiratory: Negative for cough.   Cardiovascular: Negative for chest pain.  Gastrointestinal: Negative for nausea.  Genitourinary: Negative for dysuria.  Musculoskeletal: Positive for back pain.  Skin: Negative for rash.  Neurological: Negative for dizziness.    OBJECTIVE  His  height is 6' (1.829 m) and weight is 193 lb (87.544 kg). His blood pressure is 103/72 and his pulse is 122. His respiration is 18.  The patient's body mass index is 26.17 kg/(m^2).  Physical Exam  Constitutional: He is oriented to person, place, and time. He appears cachectic.  Cardiovascular: Normal heart sounds.   No extrasystoles are present. Tachycardia present.   Musculoskeletal:       Lumbar back: He exhibits pain and spasm. He exhibits  no tenderness, no bony tenderness, no swelling and no edema.  Neurological: He is alert and oriented to person, place, and time. He displays no tremor. No cranial nerve deficit or sensory deficit. He exhibits normal muscle tone. Coordination and gait normal.  Reflex Scores:      Patellar reflexes are 2+ on the right side and 2+ on the left side.      Achilles reflexes are 2+ on the right side and 2+ on the left side.   No results found for this or any previous visit (from the past 24 hour(s)).  ASSESSMENT & PLAN  Garlen was seen today for back pain.  Diagnoses and all orders for this visit:  Bilateral low back pain without sciatica: Pet scan negative for mets to the spine per radiology. Spoke with Dr. Marin Olp via phone and he advised that prednisone would fine.  Left message for the patient on VM.  Called pharmacy and advised that they destroy the opioid prescription and fill the prednisone.   -     Discontinue: hydrocodone-ibuprofen (VICOPROFEN) 5-200 MG per tablet; Take 1 tablet by mouth every 8 (eight) hours as needed for pain. -     predniSONE (DELTASONE) 20 MG tablet; Take 3 PO QAM x3days, 2 PO QAM x3days, 1 PO QAM x3days    The patient was advised to call or come back to clinic if he does not see an  improvement in symptoms, or worsens with the above plan.   Philis Fendt, MHS, PA-C [facility] 09/22/2015 5:16 PM

## 2015-09-22 NOTE — Addendum Note (Signed)
Addended by: Tereasa Coop on: 09/22/2015 05:19 PM   Modules accepted: Level of Service

## 2015-09-24 ENCOUNTER — Inpatient Hospital Stay (HOSPITAL_COMMUNITY)
Admission: EM | Admit: 2015-09-24 | Discharge: 2015-10-31 | DRG: 853 | Disposition: E | Payer: BLUE CROSS/BLUE SHIELD | Attending: Internal Medicine | Admitting: Internal Medicine

## 2015-09-24 ENCOUNTER — Emergency Department (HOSPITAL_COMMUNITY): Payer: BLUE CROSS/BLUE SHIELD

## 2015-09-24 ENCOUNTER — Encounter (HOSPITAL_COMMUNITY): Payer: Self-pay | Admitting: Emergency Medicine

## 2015-09-24 ENCOUNTER — Other Ambulatory Visit: Payer: Self-pay

## 2015-09-24 DIAGNOSIS — D689 Coagulation defect, unspecified: Secondary | ICD-10-CM | POA: Diagnosis not present

## 2015-09-24 DIAGNOSIS — A047 Enterocolitis due to Clostridium difficile: Secondary | ICD-10-CM | POA: Diagnosis not present

## 2015-09-24 DIAGNOSIS — R109 Unspecified abdominal pain: Secondary | ICD-10-CM

## 2015-09-24 DIAGNOSIS — N171 Acute kidney failure with acute cortical necrosis: Secondary | ICD-10-CM | POA: Diagnosis not present

## 2015-09-24 DIAGNOSIS — R1909 Other intra-abdominal and pelvic swelling, mass and lump: Secondary | ICD-10-CM | POA: Diagnosis not present

## 2015-09-24 DIAGNOSIS — Z79899 Other long term (current) drug therapy: Secondary | ICD-10-CM | POA: Diagnosis not present

## 2015-09-24 DIAGNOSIS — R627 Adult failure to thrive: Secondary | ICD-10-CM | POA: Diagnosis not present

## 2015-09-24 DIAGNOSIS — C8191 Hodgkin lymphoma, unspecified, lymph nodes of head, face, and neck: Secondary | ICD-10-CM | POA: Diagnosis present

## 2015-09-24 DIAGNOSIS — Z7189 Other specified counseling: Secondary | ICD-10-CM

## 2015-09-24 DIAGNOSIS — E46 Unspecified protein-calorie malnutrition: Secondary | ICD-10-CM | POA: Diagnosis present

## 2015-09-24 DIAGNOSIS — A419 Sepsis, unspecified organism: Secondary | ICD-10-CM | POA: Diagnosis present

## 2015-09-24 DIAGNOSIS — Z452 Encounter for adjustment and management of vascular access device: Secondary | ICD-10-CM

## 2015-09-24 DIAGNOSIS — E8809 Other disorders of plasma-protein metabolism, not elsewhere classified: Secondary | ICD-10-CM | POA: Diagnosis present

## 2015-09-24 DIAGNOSIS — M545 Low back pain, unspecified: Secondary | ICD-10-CM

## 2015-09-24 DIAGNOSIS — E872 Acidosis, unspecified: Secondary | ICD-10-CM

## 2015-09-24 DIAGNOSIS — Z515 Encounter for palliative care: Secondary | ICD-10-CM | POA: Diagnosis not present

## 2015-09-24 DIAGNOSIS — J80 Acute respiratory distress syndrome: Secondary | ICD-10-CM | POA: Insufficient documentation

## 2015-09-24 DIAGNOSIS — K625 Hemorrhage of anus and rectum: Secondary | ICD-10-CM | POA: Diagnosis not present

## 2015-09-24 DIAGNOSIS — C8198 Hodgkin lymphoma, unspecified, lymph nodes of multiple sites: Secondary | ICD-10-CM

## 2015-09-24 DIAGNOSIS — R7989 Other specified abnormal findings of blood chemistry: Secondary | ICD-10-CM | POA: Diagnosis not present

## 2015-09-24 DIAGNOSIS — R6521 Severe sepsis with septic shock: Secondary | ICD-10-CM | POA: Diagnosis present

## 2015-09-24 DIAGNOSIS — E871 Hypo-osmolality and hyponatremia: Secondary | ICD-10-CM | POA: Diagnosis present

## 2015-09-24 DIAGNOSIS — E875 Hyperkalemia: Secondary | ICD-10-CM | POA: Diagnosis present

## 2015-09-24 DIAGNOSIS — K922 Gastrointestinal hemorrhage, unspecified: Secondary | ICD-10-CM | POA: Diagnosis not present

## 2015-09-24 DIAGNOSIS — J9690 Respiratory failure, unspecified, unspecified whether with hypoxia or hypercapnia: Secondary | ICD-10-CM | POA: Insufficient documentation

## 2015-09-24 DIAGNOSIS — J189 Pneumonia, unspecified organism: Secondary | ICD-10-CM | POA: Diagnosis present

## 2015-09-24 DIAGNOSIS — Z6828 Body mass index (BMI) 28.0-28.9, adult: Secondary | ICD-10-CM

## 2015-09-24 DIAGNOSIS — N19 Unspecified kidney failure: Secondary | ICD-10-CM

## 2015-09-24 DIAGNOSIS — M549 Dorsalgia, unspecified: Secondary | ICD-10-CM

## 2015-09-24 DIAGNOSIS — J9600 Acute respiratory failure, unspecified whether with hypoxia or hypercapnia: Secondary | ICD-10-CM | POA: Insufficient documentation

## 2015-09-24 DIAGNOSIS — R34 Anuria and oliguria: Secondary | ICD-10-CM | POA: Diagnosis present

## 2015-09-24 DIAGNOSIS — G92 Toxic encephalopathy: Secondary | ICD-10-CM | POA: Diagnosis present

## 2015-09-24 DIAGNOSIS — J9601 Acute respiratory failure with hypoxia: Secondary | ICD-10-CM | POA: Diagnosis present

## 2015-09-24 DIAGNOSIS — E1165 Type 2 diabetes mellitus with hyperglycemia: Secondary | ICD-10-CM | POA: Diagnosis not present

## 2015-09-24 DIAGNOSIS — R570 Cardiogenic shock: Secondary | ICD-10-CM | POA: Diagnosis present

## 2015-09-24 DIAGNOSIS — K920 Hematemesis: Secondary | ICD-10-CM | POA: Diagnosis not present

## 2015-09-24 DIAGNOSIS — R630 Anorexia: Secondary | ICD-10-CM | POA: Diagnosis not present

## 2015-09-24 DIAGNOSIS — F1010 Alcohol abuse, uncomplicated: Secondary | ICD-10-CM | POA: Diagnosis present

## 2015-09-24 DIAGNOSIS — R591 Generalized enlarged lymph nodes: Secondary | ICD-10-CM | POA: Insufficient documentation

## 2015-09-24 DIAGNOSIS — A0472 Enterocolitis due to Clostridium difficile, not specified as recurrent: Secondary | ICD-10-CM | POA: Insufficient documentation

## 2015-09-24 DIAGNOSIS — E876 Hypokalemia: Secondary | ICD-10-CM | POA: Diagnosis not present

## 2015-09-24 DIAGNOSIS — F17210 Nicotine dependence, cigarettes, uncomplicated: Secondary | ICD-10-CM | POA: Diagnosis present

## 2015-09-24 DIAGNOSIS — I429 Cardiomyopathy, unspecified: Secondary | ICD-10-CM | POA: Diagnosis not present

## 2015-09-24 DIAGNOSIS — I428 Other cardiomyopathies: Secondary | ICD-10-CM | POA: Diagnosis present

## 2015-09-24 DIAGNOSIS — R1110 Vomiting, unspecified: Secondary | ICD-10-CM

## 2015-09-24 DIAGNOSIS — I509 Heart failure, unspecified: Secondary | ICD-10-CM | POA: Diagnosis not present

## 2015-09-24 DIAGNOSIS — Z791 Long term (current) use of non-steroidal anti-inflammatories (NSAID): Secondary | ICD-10-CM | POA: Diagnosis not present

## 2015-09-24 DIAGNOSIS — K449 Diaphragmatic hernia without obstruction or gangrene: Secondary | ICD-10-CM | POA: Diagnosis present

## 2015-09-24 DIAGNOSIS — R339 Retention of urine, unspecified: Secondary | ICD-10-CM | POA: Diagnosis present

## 2015-09-24 DIAGNOSIS — B370 Candidal stomatitis: Secondary | ICD-10-CM | POA: Diagnosis not present

## 2015-09-24 DIAGNOSIS — Z66 Do not resuscitate: Secondary | ICD-10-CM | POA: Diagnosis not present

## 2015-09-24 DIAGNOSIS — E869 Volume depletion, unspecified: Secondary | ICD-10-CM | POA: Diagnosis present

## 2015-09-24 DIAGNOSIS — K567 Ileus, unspecified: Secondary | ICD-10-CM | POA: Diagnosis not present

## 2015-09-24 DIAGNOSIS — E790 Hyperuricemia without signs of inflammatory arthritis and tophaceous disease: Secondary | ICD-10-CM | POA: Diagnosis present

## 2015-09-24 DIAGNOSIS — E11649 Type 2 diabetes mellitus with hypoglycemia without coma: Secondary | ICD-10-CM | POA: Diagnosis present

## 2015-09-24 DIAGNOSIS — C8190 Hodgkin lymphoma, unspecified, unspecified site: Secondary | ICD-10-CM | POA: Diagnosis not present

## 2015-09-24 DIAGNOSIS — N170 Acute kidney failure with tubular necrosis: Secondary | ICD-10-CM | POA: Diagnosis present

## 2015-09-24 DIAGNOSIS — R29898 Other symptoms and signs involving the musculoskeletal system: Secondary | ICD-10-CM

## 2015-09-24 DIAGNOSIS — R945 Abnormal results of liver function studies: Secondary | ICD-10-CM

## 2015-09-24 DIAGNOSIS — K7200 Acute and subacute hepatic failure without coma: Secondary | ICD-10-CM | POA: Diagnosis present

## 2015-09-24 DIAGNOSIS — R52 Pain, unspecified: Secondary | ICD-10-CM

## 2015-09-24 DIAGNOSIS — M6282 Rhabdomyolysis: Secondary | ICD-10-CM | POA: Diagnosis present

## 2015-09-24 DIAGNOSIS — K210 Gastro-esophageal reflux disease with esophagitis: Secondary | ICD-10-CM | POA: Diagnosis not present

## 2015-09-24 DIAGNOSIS — N289 Disorder of kidney and ureter, unspecified: Secondary | ICD-10-CM | POA: Diagnosis not present

## 2015-09-24 DIAGNOSIS — D62 Acute posthemorrhagic anemia: Secondary | ICD-10-CM | POA: Diagnosis not present

## 2015-09-24 DIAGNOSIS — K208 Other esophagitis: Secondary | ICD-10-CM | POA: Diagnosis present

## 2015-09-24 DIAGNOSIS — N179 Acute kidney failure, unspecified: Secondary | ICD-10-CM

## 2015-09-24 DIAGNOSIS — R0600 Dyspnea, unspecified: Secondary | ICD-10-CM

## 2015-09-24 DIAGNOSIS — IMO0001 Reserved for inherently not codable concepts without codable children: Secondary | ICD-10-CM

## 2015-09-24 DIAGNOSIS — I5021 Acute systolic (congestive) heart failure: Secondary | ICD-10-CM

## 2015-09-24 DIAGNOSIS — R0902 Hypoxemia: Secondary | ICD-10-CM

## 2015-09-24 DIAGNOSIS — C8199 Hodgkin lymphoma, unspecified, extranodal and solid organ sites: Secondary | ICD-10-CM | POA: Insufficient documentation

## 2015-09-24 DIAGNOSIS — Z978 Presence of other specified devices: Secondary | ICD-10-CM

## 2015-09-24 DIAGNOSIS — C801 Malignant (primary) neoplasm, unspecified: Secondary | ICD-10-CM | POA: Diagnosis not present

## 2015-09-24 DIAGNOSIS — R590 Localized enlarged lymph nodes: Secondary | ICD-10-CM | POA: Diagnosis not present

## 2015-09-24 DIAGNOSIS — R17 Unspecified jaundice: Secondary | ICD-10-CM | POA: Diagnosis not present

## 2015-09-24 DIAGNOSIS — N178 Other acute kidney failure: Secondary | ICD-10-CM | POA: Diagnosis not present

## 2015-09-24 DIAGNOSIS — N888 Other specified noninflammatory disorders of cervix uteri: Secondary | ICD-10-CM

## 2015-09-24 DIAGNOSIS — I472 Ventricular tachycardia: Secondary | ICD-10-CM | POA: Diagnosis not present

## 2015-09-24 DIAGNOSIS — Z4659 Encounter for fitting and adjustment of other gastrointestinal appliance and device: Secondary | ICD-10-CM

## 2015-09-24 DIAGNOSIS — K759 Inflammatory liver disease, unspecified: Secondary | ICD-10-CM | POA: Diagnosis not present

## 2015-09-24 HISTORY — DX: Spondylosis without myelopathy or radiculopathy, cervical region: M47.812

## 2015-09-24 HISTORY — DX: Diverticulosis of large intestine without perforation or abscess without bleeding: K57.30

## 2015-09-24 HISTORY — DX: Localized swelling, mass and lump, neck: R22.1

## 2015-09-24 HISTORY — DX: Zoster without complications: B02.9

## 2015-09-24 HISTORY — DX: Other ascites: R18.8

## 2015-09-24 HISTORY — DX: Other nonspecific abnormal finding of lung field: R91.8

## 2015-09-24 HISTORY — DX: Hypertrophy of breast: N62

## 2015-09-24 HISTORY — DX: Alcohol abuse, uncomplicated: F10.10

## 2015-09-24 HISTORY — DX: Hepatomegaly, not elsewhere classified: R16.0

## 2015-09-24 HISTORY — DX: Hodgkin lymphoma, unspecified, lymph nodes of multiple sites: C81.98

## 2015-09-24 HISTORY — DX: Abnormal results of liver function studies: R94.5

## 2015-09-24 HISTORY — DX: Heart failure, unspecified: I50.9

## 2015-09-24 HISTORY — DX: Hematemesis: K92.0

## 2015-09-24 HISTORY — DX: Acute kidney failure, unspecified: N17.9

## 2015-09-24 HISTORY — DX: Anemia, unspecified: D64.9

## 2015-09-24 LAB — PROTIME-INR
INR: 2.49 — AB (ref 0.00–1.49)
PROTHROMBIN TIME: 26.6 s — AB (ref 11.6–15.2)

## 2015-09-24 LAB — URINE MICROSCOPIC-ADD ON

## 2015-09-24 LAB — URINALYSIS, ROUTINE W REFLEX MICROSCOPIC
GLUCOSE, UA: NEGATIVE mg/dL
Ketones, ur: NEGATIVE mg/dL
Leukocytes, UA: NEGATIVE
Nitrite: NEGATIVE
PH: 5 (ref 5.0–8.0)
Protein, ur: >300 mg/dL — AB
Specific Gravity, Urine: 1.021 (ref 1.005–1.030)
Urobilinogen, UA: 1 mg/dL (ref 0.0–1.0)

## 2015-09-24 LAB — COMPREHENSIVE METABOLIC PANEL
ALBUMIN: 2.6 g/dL — AB (ref 3.5–5.0)
ALT: 330 U/L — ABNORMAL HIGH (ref 17–63)
ANION GAP: 29 — AB (ref 5–15)
AST: 389 U/L — ABNORMAL HIGH (ref 15–41)
Alkaline Phosphatase: 266 U/L — ABNORMAL HIGH (ref 38–126)
BILIRUBIN TOTAL: 4.1 mg/dL — AB (ref 0.3–1.2)
BUN: 89 mg/dL — ABNORMAL HIGH (ref 6–20)
CALCIUM: 8.5 mg/dL — AB (ref 8.9–10.3)
CO2: 10 mmol/L — ABNORMAL LOW (ref 22–32)
Chloride: 97 mmol/L — ABNORMAL LOW (ref 101–111)
Creatinine, Ser: 3.43 mg/dL — ABNORMAL HIGH (ref 0.61–1.24)
GFR, EST AFRICAN AMERICAN: 22 mL/min — AB (ref >60)
GFR, EST NON AFRICAN AMERICAN: 19 mL/min — AB (ref >60)
GLUCOSE: 65 mg/dL (ref 65–99)
POTASSIUM: 6.4 mmol/L — AB (ref 3.5–5.1)
Sodium: 136 mmol/L (ref 135–145)
TOTAL PROTEIN: 6.8 g/dL (ref 6.5–8.1)

## 2015-09-24 LAB — CBG MONITORING, ED
GLUCOSE-CAPILLARY: 105 mg/dL — AB (ref 65–99)
Glucose-Capillary: 44 mg/dL — CL (ref 65–99)

## 2015-09-24 LAB — LACTATE DEHYDROGENASE: LDH: 574 U/L — AB (ref 98–192)

## 2015-09-24 LAB — I-STAT CHEM 8, ED
BUN: 80 mg/dL — ABNORMAL HIGH (ref 6–20)
CREATININE: 3.5 mg/dL — AB (ref 0.61–1.24)
Calcium, Ion: 0.93 mmol/L — ABNORMAL LOW (ref 1.12–1.23)
Chloride: 102 mmol/L (ref 101–111)
GLUCOSE: 58 mg/dL — AB (ref 65–99)
HEMATOCRIT: 44 % (ref 39.0–52.0)
HEMOGLOBIN: 15 g/dL (ref 13.0–17.0)
Potassium: 6 mmol/L — ABNORMAL HIGH (ref 3.5–5.1)
Sodium: 132 mmol/L — ABNORMAL LOW (ref 135–145)
TCO2: 11 mmol/L (ref 0–100)

## 2015-09-24 LAB — D-DIMER, QUANTITATIVE: D-Dimer, Quant: 13.39 ug/mL-FEU — ABNORMAL HIGH (ref 0.00–0.48)

## 2015-09-24 LAB — URIC ACID: Uric Acid, Serum: 11.2 mg/dL — ABNORMAL HIGH (ref 4.4–7.6)

## 2015-09-24 LAB — I-STAT TROPONIN, ED: Troponin i, poc: 0.04 ng/mL (ref 0.00–0.08)

## 2015-09-24 LAB — BASIC METABOLIC PANEL
ANION GAP: 21 — AB (ref 5–15)
BUN: 92 mg/dL — ABNORMAL HIGH (ref 6–20)
CALCIUM: 7.6 mg/dL — AB (ref 8.9–10.3)
CO2: 13 mmol/L — ABNORMAL LOW (ref 22–32)
Chloride: 103 mmol/L (ref 101–111)
Creatinine, Ser: 3.19 mg/dL — ABNORMAL HIGH (ref 0.61–1.24)
GFR, EST AFRICAN AMERICAN: 24 mL/min — AB (ref >60)
GFR, EST NON AFRICAN AMERICAN: 20 mL/min — AB (ref >60)
GLUCOSE: 114 mg/dL — AB (ref 65–99)
POTASSIUM: 5.8 mmol/L — AB (ref 3.5–5.1)
Sodium: 137 mmol/L (ref 135–145)

## 2015-09-24 LAB — APTT: APTT: 34 s (ref 24–37)

## 2015-09-24 LAB — CBC WITH DIFFERENTIAL/PLATELET
BASOS PCT: 0 %
Basophils Absolute: 0 10*3/uL (ref 0.0–0.1)
Eosinophils Absolute: 0 10*3/uL (ref 0.0–0.7)
Eosinophils Relative: 0 %
HEMATOCRIT: 38.9 % — AB (ref 39.0–52.0)
Hemoglobin: 11.8 g/dL — ABNORMAL LOW (ref 13.0–17.0)
LYMPHS ABS: 0.7 10*3/uL (ref 0.7–4.0)
Lymphocytes Relative: 4 %
MCH: 24 pg — AB (ref 26.0–34.0)
MCHC: 30.3 g/dL (ref 30.0–36.0)
MCV: 79.2 fL (ref 78.0–100.0)
MONO ABS: 1.3 10*3/uL — AB (ref 0.1–1.0)
MONOS PCT: 7 %
NEUTROS ABS: 16.5 10*3/uL — AB (ref 1.7–7.7)
Neutrophils Relative %: 89 %
Platelets: 383 10*3/uL (ref 150–400)
RBC: 4.91 MIL/uL (ref 4.22–5.81)
RDW: 18 % — AB (ref 11.5–15.5)
WBC: 18.6 10*3/uL — ABNORMAL HIGH (ref 4.0–10.5)

## 2015-09-24 LAB — I-STAT CG4 LACTIC ACID, ED: Lactic Acid, Venous: 14.88 mmol/L (ref 0.5–2.0)

## 2015-09-24 LAB — CK: CK TOTAL: 1533 U/L — AB (ref 49–397)

## 2015-09-24 LAB — PROCALCITONIN: PROCALCITONIN: 5.88 ng/mL

## 2015-09-24 LAB — MAGNESIUM: Magnesium: 2.9 mg/dL — ABNORMAL HIGH (ref 1.7–2.4)

## 2015-09-24 LAB — PHOSPHORUS: Phosphorus: 11 mg/dL — ABNORMAL HIGH (ref 2.5–4.6)

## 2015-09-24 LAB — LACTIC ACID, PLASMA: LACTIC ACID, VENOUS: 8.6 mmol/L — AB (ref 0.5–2.0)

## 2015-09-24 LAB — FIBRINOGEN: Fibrinogen: 401 mg/dL (ref 204–475)

## 2015-09-24 LAB — GLUCOSE, CAPILLARY: GLUCOSE-CAPILLARY: 109 mg/dL — AB (ref 65–99)

## 2015-09-24 MED ORDER — FUROSEMIDE 10 MG/ML IJ SOLN
80.0000 mg | Freq: Once | INTRAMUSCULAR | Status: AC
  Administered 2015-09-25: 80 mg via INTRAVENOUS
  Filled 2015-09-24: qty 8

## 2015-09-24 MED ORDER — HYDROMORPHONE HCL 2 MG/ML IJ SOLN
2.0000 mg | Freq: Once | INTRAMUSCULAR | Status: AC
  Administered 2015-09-24: 2 mg via INTRAVENOUS
  Filled 2015-09-24: qty 1

## 2015-09-24 MED ORDER — SODIUM CHLORIDE 0.9 % IV BOLUS (SEPSIS)
1000.0000 mL | Freq: Once | INTRAVENOUS | Status: AC
  Administered 2015-09-24: 1000 mL via INTRAVENOUS

## 2015-09-24 MED ORDER — SODIUM CHLORIDE 0.9 % IV SOLN
6.0000 mg | Freq: Once | INTRAVENOUS | Status: AC
  Administered 2015-09-24: 6 mg via INTRAVENOUS
  Filled 2015-09-24: qty 4

## 2015-09-24 MED ORDER — VANCOMYCIN HCL IN DEXTROSE 1-5 GM/200ML-% IV SOLN: 1000.0000 mg | INTRAVENOUS | Status: DC

## 2015-09-24 MED ORDER — PANTOPRAZOLE SODIUM 40 MG IV SOLR
40.0000 mg | Freq: Every day | INTRAVENOUS | Status: DC
  Administered 2015-09-24: 40 mg via INTRAVENOUS
  Filled 2015-09-24: qty 40

## 2015-09-24 MED ORDER — PIPERACILLIN-TAZOBACTAM 3.375 G IVPB 30 MIN
3.3750 g | INTRAVENOUS | Status: AC
  Administered 2015-09-24: 3.375 g via INTRAVENOUS
  Filled 2015-09-24: qty 50

## 2015-09-24 MED ORDER — ONDANSETRON HCL 4 MG/2ML IJ SOLN
4.0000 mg | Freq: Four times a day (QID) | INTRAMUSCULAR | Status: DC | PRN
  Administered 2015-09-29 – 2015-10-09 (×3): 4 mg via INTRAVENOUS
  Filled 2015-09-24 (×3): qty 2

## 2015-09-24 MED ORDER — ALBUTEROL SULFATE (2.5 MG/3ML) 0.083% IN NEBU: 2.5000 mg | INHALATION_SOLUTION | RESPIRATORY_TRACT | Status: DC | PRN

## 2015-09-24 MED ORDER — VANCOMYCIN HCL 10 G IV SOLR
1250.0000 mg | INTRAVENOUS | Status: AC
  Administered 2015-09-24: 1250 mg via INTRAVENOUS
  Filled 2015-09-24: qty 1250

## 2015-09-24 MED ORDER — SODIUM CHLORIDE 0.9 % IV SOLN
1.0000 g | Freq: Once | INTRAVENOUS | Status: AC
  Administered 2015-09-24: 1 g via INTRAVENOUS
  Filled 2015-09-24: qty 10

## 2015-09-24 MED ORDER — SODIUM CHLORIDE 0.9 % IV BOLUS (SEPSIS)
1000.0000 mL | INTRAVENOUS | Status: AC
  Administered 2015-09-24: 1000 mL via INTRAVENOUS

## 2015-09-24 MED ORDER — SODIUM BICARBONATE 8.4 % IV SOLN
50.0000 meq | Freq: Once | INTRAVENOUS | Status: AC
  Administered 2015-09-24: 50 meq via INTRAVENOUS
  Filled 2015-09-24: qty 50

## 2015-09-24 MED ORDER — DEXTROSE-NACL 5-0.9 % IV SOLN
INTRAVENOUS | Status: DC
  Administered 2015-09-24: 22:00:00 via INTRAVENOUS

## 2015-09-24 MED ORDER — PIPERACILLIN-TAZOBACTAM 3.375 G IVPB
3.3750 g | Freq: Three times a day (TID) | INTRAVENOUS | Status: DC
  Administered 2015-09-25 – 2015-09-28 (×10): 3.375 g via INTRAVENOUS
  Filled 2015-09-24 (×11): qty 50

## 2015-09-24 MED ORDER — FENTANYL CITRATE (PF) 100 MCG/2ML IJ SOLN
50.0000 ug | Freq: Once | INTRAMUSCULAR | Status: AC
  Administered 2015-09-24: 50 ug via INTRAVENOUS
  Filled 2015-09-24: qty 2

## 2015-09-24 MED ORDER — ONDANSETRON HCL 4 MG/2ML IJ SOLN
4.0000 mg | Freq: Once | INTRAMUSCULAR | Status: DC
  Filled 2015-09-24: qty 2

## 2015-09-24 MED ORDER — HYDROMORPHONE HCL 2 MG/ML IJ SOLN
2.0000 mg | Freq: Once | INTRAMUSCULAR | Status: AC
  Administered 2015-09-24: 2 mg via INTRAMUSCULAR
  Filled 2015-09-24: qty 1

## 2015-09-24 MED ORDER — DEXTROSE 50 % IV SOLN
1.0000 | Freq: Once | INTRAVENOUS | Status: AC
  Administered 2015-09-24: 50 mL via INTRAVENOUS
  Filled 2015-09-24: qty 50

## 2015-09-24 MED ORDER — HYDROMORPHONE HCL 1 MG/ML IJ SOLN: 1.0000 mg | Freq: Once | INTRAMUSCULAR | Status: DC

## 2015-09-24 NOTE — ED Notes (Signed)
We receive him into resus. B having just returned from a curtailed MRI.  He is dusky with his skin being diaphoretic and cool.  Monitor shows nsr.  Dr. Kathrynn Humble here and we immediately prep him for central line insertion after lengthy discussion of need for central line along with the risks thereof.

## 2015-09-24 NOTE — ED Notes (Addendum)
Total IV Fluid infused this shift: 1700 mL

## 2015-09-24 NOTE — ED Provider Notes (Signed)
CSN: [id number]     Arrival date & time 09/28/2015  1405 History   First MD Initiated Contact with Patient 09/04/2015 1516     Chief Complaint  Patient presents with  . Back Pain  . Abdominal Pain     (Consider location/radiation/quality/duration/timing/severity/associated sxs/prior Treatment) HPI Comments: Patient presents to the ED with a chief complaint of low back pain.  Patient is in the middle of a complex workup for a neck mass, which he has had for the past 3 months.  He was seen by his PCP 2 weeks, who ordered a CT scan of the mass in his neck.  It was found that he also had supraclavicular lymph nodes.  A PET scan was ordered and has findings consistent with lymphoma, but metastatic carcinoma is not ruled out.  He has not yet received the PET scan results, but they are accessible in EPIC today.  Regarding today's visit specifically, he states that he has been having worsening low back pain for the past 3 weeks, but it became severe today.  He states that his legs feel heavy and that he has the urge to urinate, but has been unable to urinate since this morning.  He states that the pain is mostly located in his low back and radiates to his abdomen, however he denies any reproducible abdominal pain.  Patient is taking 50 mg of Tramadol and prednisone with no relief.    The history is provided by the patient. No language interpreter was used.    Past Medical History  Diagnosis Date  . Allergy    History reviewed. No pertinent past surgical history. History reviewed. No pertinent family history. Social History  Substance Use Topics  . Smoking status: Current Every Day Smoker -- 1.00 packs/day    Types: Cigarettes  . Smokeless tobacco: None  . Alcohol Use: 0.0 oz/week    0 Standard drinks or equivalent per week    Review of Systems  Constitutional: Negative for fever and chills.  Gastrointestinal:       No bowel incontinence  Genitourinary:       No urinary incontinence, but does  have sensation of urinary retention  Musculoskeletal: Positive for myalgias, back pain and arthralgias.  Neurological: Positive for weakness.       No saddle anesthesia, heavy sensation in legs  All other systems reviewed and are negative.     Allergies  Peanut-containing drug products  Home Medications   Prior to Admission medications   Medication Sig Start Date End Date Taking? Authorizing Provider  cyclobenzaprine (FLEXERIL) 10 MG tablet Take 1 tablet (10 mg total) by mouth 3 (three) times daily as needed for muscle spasms. 09/08/15  Yes Roselee Culver, MD  EPINEPHrine 0.3 mg/0.3 mL IJ SOAJ injection Inject 0.3 mLs (0.3 mg total) into the muscle once. 09/08/15  Yes Roselee Culver, MD  esomeprazole (NEXIUM) 20 MG capsule Take 20 mg by mouth daily at 12 noon.   Yes Historical Provider, MD  Multiple Vitamin (MULTIVITAMIN WITH MINERALS) TABS tablet Take 1 tablet by mouth daily.   Yes Historical Provider, MD  naproxen sodium (ANAPROX DS) 550 MG tablet Take 1 tablet (550 mg total) by mouth 2 (two) times daily with a meal. 09/08/15 09/07/16 Yes Roselee Culver, MD  predniSONE (DELTASONE) 20 MG tablet Take 3 PO QAM x3days, 2 PO QAM x3days, 1 PO QAM x3days 09/22/15  Yes Tereasa Coop, PA-C  traMADol (ULTRAM) 50 MG tablet Take 1 tablet (50 mg  total) by mouth every 8 (eight) hours as needed. Patient taking differently: Take 50 mg by mouth every 8 (eight) hours as needed for moderate pain.  09/08/15  Yes Roselee Culver, MD   BP 106/47 mmHg  Pulse 109  Resp 22  SpO2 100% Physical Exam  Constitutional: He is oriented to person, place, and time. He appears well-developed and well-nourished. No distress.  HENT:  Head: Normocephalic and atraumatic.  Eyes: Conjunctivae and EOM are normal. Pupils are equal, round, and reactive to light. Right eye exhibits no discharge. Left eye exhibits no discharge. No scleral icterus.  Neck: Normal range of motion. Neck supple. No JVD present. No tracheal  deviation present.  Cardiovascular: Regular rhythm and normal heart sounds.  Exam reveals no gallop and no friction rub.   No murmur heard. Mildly tachycardic  Bilateral dorsal pedis pulses and posterior tibs are dopplerable only, not able to palpate  Pulmonary/Chest: Effort normal and breath sounds normal. No respiratory distress. He has no wheezes. He has no rales. He exhibits no tenderness.  Abdominal: Soft. He exhibits no distension and no mass. There is no tenderness. There is no rebound and no guarding.  No focal abdominal tenderness, no RLQ tenderness or pain at McBurney's point, no RUQ tenderness or Murphy's sign, no left-sided abdominal tenderness, no fluid wave, or signs of peritonitis   Musculoskeletal: Normal range of motion. He exhibits no edema or tenderness.  No CTLS paraspinal muscles tender to palpation, no bony tenderness, step-offs, or gross abnormality or deformity of spine, patient is able to ambulate, moves all extremities  Bilateral great toe extension intact Bilateral plantar/dorsiflexion intact  Neurological: He is alert and oriented to person, place, and time. He has normal reflexes.  Sensation and strength intact bilaterally Symmetrical reflexes  Skin: Skin is warm and dry. He is not diaphoretic.  Bilateral lower extremities cool to touch and mottled below mid shin  Psychiatric: He has a normal mood and affect. His behavior is normal. Judgment and thought content normal.  Nursing note and vitals reviewed.   ED Course  Procedures (including critical care time) Results for orders placed or performed during the hospital encounter of 09/14/2015  CBC with Differential/Platelet  Result Value Ref Range   WBC 18.6 (H) 4.0 - 10.5 K/uL   RBC 4.91 4.22 - 5.81 MIL/uL   Hemoglobin 11.8 (L) 13.0 - 17.0 g/dL   HCT 38.9 (L) 39.0 - 52.0 %   MCV 79.2 78.0 - 100.0 fL   MCH 24.0 (L) 26.0 - 34.0 pg   MCHC 30.3 30.0 - 36.0 g/dL   RDW 18.0 (H) 11.5 - 15.5 %   Platelets 383 150  - 400 K/uL   Neutrophils Relative % 89 %   Neutro Abs 16.5 (H) 1.7 - 7.7 K/uL   Lymphocytes Relative 4 %   Lymphs Abs 0.7 0.7 - 4.0 K/uL   Monocytes Relative 7 %   Monocytes Absolute 1.3 (H) 0.1 - 1.0 K/uL   Eosinophils Relative 0 %   Eosinophils Absolute 0.0 0.0 - 0.7 K/uL   Basophils Relative 0 %   Basophils Absolute 0.0 0.0 - 0.1 K/uL  Comprehensive metabolic panel  Result Value Ref Range   Sodium 136 135 - 145 mmol/L   Potassium 6.4 (HH) 3.5 - 5.1 mmol/L   Chloride 97 (L) 101 - 111 mmol/L   CO2 10 (L) 22 - 32 mmol/L   Glucose, Bld 65 65 - 99 mg/dL   BUN 89 (H) 6 - 20 mg/dL  Creatinine, Ser 3.43 (H) 0.61 - 1.24 mg/dL   Calcium 8.5 (L) 8.9 - 10.3 mg/dL   Total Protein 6.8 6.5 - 8.1 g/dL   Albumin 2.6 (L) 3.5 - 5.0 g/dL   AST 389 (H) 15 - 41 U/L   ALT 330 (H) 17 - 63 U/L   Alkaline Phosphatase 266 (H) 38 - 126 U/L   Total Bilirubin 4.1 (H) 0.3 - 1.2 mg/dL   GFR calc non Af Amer 19 (L) >60 mL/min   GFR calc Af Amer 22 (L) >60 mL/min   Anion gap 29 (H) 5 - 15  Protime-INR  Result Value Ref Range   Prothrombin Time 26.6 (H) 11.6 - 15.2 seconds   INR 2.49 (H) 0.00 - 1.49  APTT  Result Value Ref Range   aPTT 34 24 - 37 seconds  CK  Result Value Ref Range   Total CK 1533 (H) 49 - 397 U/L  Fibrinogen  Result Value Ref Range   Fibrinogen 401 204 - 475 mg/dL  D-dimer, quantitative (not at [facility])  Result Value Ref Range   D-Dimer, Quant 13.39 (H) 0.00 - 0.48 ug/mL-FEU  I-Stat CG4 Lactic Acid, ED  Result Value Ref Range   Lactic Acid, Venous 14.88 (HH) 0.5 - 2.0 mmol/L   Comment NOTIFIED PHYSICIAN   I-Stat Troponin, ED (not at [facility])  Result Value Ref Range   Troponin i, poc 0.04 0.00 - 0.08 ng/mL   Comment 3          I-stat chem 8, ed  Result Value Ref Range   Sodium 132 (L) 135 - 145 mmol/L   Potassium 6.0 (H) 3.5 - 5.1 mmol/L   Chloride 102 101 - 111 mmol/L   BUN 80 (H) 6 - 20 mg/dL   Creatinine, Ser 3.50 (H) 0.61 - 1.24 mg/dL   Glucose, Bld 58 (L) 65 - 99 mg/dL    Calcium, Ion 0.93 (L) 1.12 - 1.23 mmol/L   TCO2 11 0 - 100 mmol/L   Hemoglobin 15.0 13.0 - 17.0 g/dL   HCT 44.0 39.0 - 52.0 %  CBG monitoring, ED  Result Value Ref Range   Glucose-Capillary 44 (LL) 65 - 99 mg/dL   Comment 1 Notify RN    Comment 2 Document in Chart   CBG monitoring, ED  Result Value Ref Range   Glucose-Capillary 105 (H) 65 - 99 mg/dL   Dg Chest 1 View  09/16/2015   CLINICAL DATA:  Shortness of breath today. Cervical lymphadenopathy. Central line placement. Initial encounter.  EXAM: CHEST 1 VIEW  COMPARISON:  PET-CT 09/21/2015  FINDINGS: 1831 hours. Right IJ central venous catheter tip is at the lower SVC level. No evidence of pneumothorax. Cardiomegaly appears unchanged. There is worsening aeration of the lungs with probable mild pulmonary edema. There is probable layering of the pleural effusions demonstrated on recent PET-CT. The bones appear unchanged.  IMPRESSION: Central line placement as described, tip at the lower SVC level. No pneumothorax. Cardiomegaly with edema and bilateral pleural effusions.   Electronically Signed   By: Richardean Sale M.D.   On: 09/26/2015 18:38   Ct Soft Tissue Neck W Contrast  09/08/2015   CLINICAL DATA:  Left-sided neck swelling and mass for 2 months  EXAM: CT NECK WITH CONTRAST  TECHNIQUE: Multidetector CT imaging of the neck was performed using the standard protocol following the bolus administration of intravenous contrast.  CONTRAST:  62mL OMNIPAQUE IOHEXOL 300 MG/ML  SOLN  COMPARISON:  None.  FINDINGS: Pharynx and  larynx: Normal  Salivary glands: Normal  Thyroid: Normal  Lymph nodes: Numerous large isodense soft tissue masses in the supraclavicular left side deep to sternocleidomastoid musculature consistent with massive adenopathy. The largest single mass measures 3 x 4 cm. In total the complex of presumed adenopathy measures about 9 cm craniocaudal by 4 x 6.9 cm transverse. Adenopathy begins at the level of the carotid bulb and extends  inferiorly to behind the left clavicle.  Vascular: Bilateral carotid bulb calcification. There are numerous varices in the dorsal paraspinous soft tissues of the upper thoracic and cervical spine region.  Limited intracranial: Negative  Visualized orbits: Orbits not visualized  Mastoids and visualized paranasal sinuses: Clear  Skeleton: No acute abnormalities. Reversed lordosis cervical spine with moderate central cervical spine degenerative disc disease at multiple levels.  Upper chest: Is numerous small mediastinal lymph nodes visualized only partially.  IMPRESSION: Large burden of numerous supraclavicular soft tissue masses consistent with adenopathy. Findings are concerning for possibility of lymphoma or metastasis.   Electronically Signed   By: Skipper Cliche M.D.   On: 09/08/2015 12:28   Mr Thoracic Spine Wo Contrast  09/03/2015   CLINICAL DATA:  Leg heaviness. Back injury 3 weeks ago. Worsening pain today. Unable to urinate. Pain radiates from the back all the way around to the abdomen.  EXAM: MRI THORACIC SPINE WITHOUT CONTRAST; MRI CERVICAL SPINE WITHOUT CONTRAST  TECHNIQUE: Multiplanar and multiecho pulse sequences of the thoracic spine were obtained without intravenous contrast.; Multiplanar and multiecho pulse sequences of the cervical spine, to include the craniocervical junction and cervicothoracic junction, were obtained according to standard protocol without intravenous contrast.  COMPARISON:  PET-CT 09/21/2015.  Soft tissue neck CT 09/08/2015.  FINDINGS: The study was ordered as a metastasis screening protocol of the total spine. However, after sagittal T1 and sagittal STIR images were obtained of the cervical and upper thoracic spine, the examination was discontinued due to decreased oxygen saturation and critical lab values and the patient was returned to the emergency department.  Obtained sagittal images extend from the craniocervical junction to the T9 level. Mild motion artifact is present  through the cervical spine.  There is reversal of the normal cervical lordosis. Visualized portion of the thoracic spine demonstrates normal alignment. Mild-to-moderate multilevel disc degeneration is present in the mid and lower cervical spine with predominantly type 2 degenerative endplate marrow changes present, most notably at C6-7. Shallow disc protrusions/disc bulging and endplate spurring are present in the cervical spine, however there is no evidence of significant cervical spinal canal stenosis. A tiny disc protrusion is present at T5-6 without spinal stenosis seen in the visualized portion of the thoracic spine.  The visualized spinal cord is normal in caliber without definite signal abnormality identified, although motion limits evaluation in the cervical spine. There is no cord compression. Bulky left-sided cervical lymphadenopathy is more fully evaluated on prior neck CT.  IMPRESSION: Incomplete examination as above. Mild-to-moderate cervical disc degeneration. No evidence of significant spinal stenosis or cord compression in the cervical or visualized thoracic spine.   Electronically Signed   By: Logan Bores M.D.   On: 09/18/2015 18:27   Nm Pet Image Initial (pi) Skull Base To Thigh  09/21/2015   CLINICAL DATA:  Initial treatment strategy for extensive left cervical lymphadenopathy.  EXAM: NUCLEAR MEDICINE PET SKULL BASE TO THIGH  TECHNIQUE: 9.1 mCi F-18 FDG was injected intravenously. Full-ring PET imaging was performed from the skull base to thigh after the radiotracer. CT data was obtained and  used for attenuation correction and anatomic localization.  FASTING BLOOD GLUCOSE:  Value: 95 mg/dl  COMPARISON:  09/08/2015 neck CT.  FINDINGS: NECK  There is bulky confluent hypermetabolic cervical lymphadenopathy involving the level 2, 3, 4 and 5 left neck lymph node chains. A representative 2.4 cm left level 2 node (series 4/ image 37) demonstrates max SUV 16.6. A representative 2.6 cm left level 3  node (4/51) demonstrates max SUV 16.6. A representative 1.5 cm left level 4 node (4/57) demonstrates max SUV 5.7. A representative 3.6 cm left level 5 node (4/46) demonstrates max SUV 20.9. No hypermetabolic right cervical nodes. No hypermetabolic mucosal surface mass is seen in the head or neck.  CHEST  No hypermetabolic axillary, mediastinal or hilar nodes.  Small right and trace left layering pleural effusions. Cardiomegaly. Mild mitral annular calcification. There is hypermetabolic patchy ground-glass opacity and consolidation in the anterior apical right upper lobe, posterior lingula and posterior basilar right lower lobe with max SUV 7.0 in the right lower lobe. There is a mildly hypermetabolic 1.8 x 1.7 cm subsolid left upper lobe pulmonary nodule (series 6/image 25) with max SUV 4.0, which appears new compared to the 09/08/2015 neck CT study. There is mild centrilobular emphysema. Symmetric gynecomastia.  ABDOMEN/PELVIS  There is hepatomegaly with diffuse hypermetabolism in the liver. No focal hypermetabolic liver mass. Simple 1.6 cm left liver lobe cyst. Normal size spleen. Diffuse colonic diverticulosis. Small volume ascites. Atherosclerotic nonaneurysmal abdominal aorta. Mild anasarca. Small fat containing right inguinal hernia. Small fat containing umbilical hernia.  No abnormal hypermetabolic activity within the pancreas, adrenal glands, or spleen. No hypermetabolic lymph nodes in the abdomen or pelvis.  SKELETON  No focal hypermetabolic activity to suggest skeletal metastasis.  IMPRESSION: 1. Bulky confluent hypermetabolic left cervical lymphadenopathy involving levels II-V. Given the presence of hepatomegaly with diffuse liver hypermetabolism, a diagnosis of lymphoma is favored, however metastatic carcinoma remains on the differential. No hypermetabolic mucosal surface mass is seen in the head or neck. 2. Hypermetabolic patchy ground-glass opacity and consolidation in both upper lobes and right lower  lobe, favor an infectious or inflammatory etiology. These opacities should be reassessed on a chest CT in 2-3 months. 3. Cardiomegaly. 4. Evidence of third-spacing, including mild anasarca, small right and trace left pleural effusions and small volume ascites. The presence of gynecomastia and hepatomegaly indicates liver disease, and the third-spacing could be on the basis of hypoalbuminemia. Recommend correlation with serum albumin levels. 5. Mild centrilobular emphysema.   Electronically Signed   By: Ilona Sorrel M.D.   On: 09/21/2015 10:05   Mr C Spine Ltd W/o Cm  09/28/2015   CLINICAL DATA:  Leg heaviness. Back injury 3 weeks ago. Worsening pain today. Unable to urinate. Pain radiates from the back all the way around to the abdomen.  EXAM: MRI THORACIC SPINE WITHOUT CONTRAST; MRI CERVICAL SPINE WITHOUT CONTRAST  TECHNIQUE: Multiplanar and multiecho pulse sequences of the thoracic spine were obtained without intravenous contrast.; Multiplanar and multiecho pulse sequences of the cervical spine, to include the craniocervical junction and cervicothoracic junction, were obtained according to standard protocol without intravenous contrast.  COMPARISON:  PET-CT 09/21/2015.  Soft tissue neck CT 09/08/2015.  FINDINGS: The study was ordered as a metastasis screening protocol of the total spine. However, after sagittal T1 and sagittal STIR images were obtained of the cervical and upper thoracic spine, the examination was discontinued due to decreased oxygen saturation and critical lab values and the patient was returned to the emergency department.  Obtained sagittal images extend from the craniocervical junction to the T9 level. Mild motion artifact is present through the cervical spine.  There is reversal of the normal cervical lordosis. Visualized portion of the thoracic spine demonstrates normal alignment. Mild-to-moderate multilevel disc degeneration is present in the mid and lower cervical spine with  predominantly type 2 degenerative endplate marrow changes present, most notably at C6-7. Shallow disc protrusions/disc bulging and endplate spurring are present in the cervical spine, however there is no evidence of significant cervical spinal canal stenosis. A tiny disc protrusion is present at T5-6 without spinal stenosis seen in the visualized portion of the thoracic spine.  The visualized spinal cord is normal in caliber without definite signal abnormality identified, although motion limits evaluation in the cervical spine. There is no cord compression. Bulky left-sided cervical lymphadenopathy is more fully evaluated on prior neck CT.  IMPRESSION: Incomplete examination as above. Mild-to-moderate cervical disc degeneration. No evidence of significant spinal stenosis or cord compression in the cervical or visualized thoracic spine.   Electronically Signed   By: Logan Bores M.D.   On: 09/05/2015 18:27       EKG Interpretation None      MDM   Final diagnoses:  Back pain  Leg heaviness  Septic shock  Renal failure  Shock liver    Patient with back pain.  Concern for malignant etiology given that patient is in the middle of a new workup for neck mass.  PET scan as above.  Patient seen by and discussed with Dr. Kathrynn Humble, who agrees with plan for MRI of entire spine.  Will check labs and treat pain.  Will reassess.  She noted to have some mottling of lower extremities. Lower extremities are cool to the touch. Distal pedal and posterior tibialis pulses are not palpable, however they are obtainable with Doppler ultrasound. They do sound faint but are consistent. Patient has normal palpable equal femoral pulses. Dr. Kathrynn Humble bedside during this exam.  Rectal temp is 95.2.  Will apply bair hugger.  4:38 PM Order fibrinogen and d-dimer for possible DIC.  Spoke with Dr. Kathrynn Humble about MRI vs CT now.  Dr. Kathrynn Humble advises to stick with plan of going to MRI now and CT when he gets back.  Will  insert central line when patient returns for great access.  Critical labs reviewed with Dr. Kathrynn Humble.  Will start sepsis order set.  RN to accompany patient to MRI.  K is 6.  Patient will be treated for hyperkalemia.  Patient became hypoxic while an MRI. Patient return to emergency department. MRI not completed. Patient started on 3 L of nasal cannula oxygen.  Central line inserted by Dr. none body.  Appreciate consultation over telephone with Dr. Burr Medico from heme/onc, who recommends checking uric acid, phosphorus, magnesium and LDH to assess for tumor lysis syndrome.  Dr. Burr Medico recommends admission and will consult in the morning.  Dr. Burr Medico remains available tonight if needed.  Appreciate critical care team for admitting the patient.  Patient admitted for septic shock with shock liver, renal failure.  Medications  ondansetron (ZOFRAN) injection 4 mg (4 mg Intravenous Not Given 09/04/2015 1517)  sodium chloride 0.9 % bolus 1,000 mL (1,000 mLs Intravenous New Bag/Given 09/22/2015 1832)  vancomycin (VANCOCIN) IVPB 1000 mg/200 mL premix (not administered)  piperacillin-tazobactam (ZOSYN) IVPB 3.375 g (not administered)  fentaNYL (SUBLIMAZE) injection 50 mcg (50 mcg Intravenous Given 09/23/2015 1516)  sodium chloride 0.9 % bolus 1,000 mL (1,000 mLs Intravenous New Bag/Given 09/03/2015 1646)  HYDROmorphone (DILAUDID) injection 2 mg (2 mg Intramuscular Given 09/08/2015 1552)  HYDROmorphone (DILAUDID) injection 2 mg (2 mg Intravenous Given 09/12/2015 1648)  piperacillin-tazobactam (ZOSYN) IVPB 3.375 g (0 g Intravenous Stopped 09/07/2015 1859)  vancomycin (VANCOCIN) 1,250 mg in sodium chloride 0.9 % 250 mL IVPB (1,250 mg Intravenous New Bag/Given 09/27/2015 1740)  dextrose 50 % solution 50 mL (50 mLs Intravenous Given 09/17/2015 1743)  sodium bicarbonate injection 50 mEq (50 mEq Intravenous Given 09/08/2015 1835)  calcium gluconate 1 g in sodium chloride 0.9 % 100 mL IVPB (0 g Intravenous Stopped 09/27/2015 1900)   CRITICAL  CARE Performed by: Montine Circle   Total critical care time: 45 Critical care time was exclusive of separately billable procedures and treating other patients.  Critical care was necessary to treat or prevent imminent or life-threatening deterioration.  Critical care was time spent personally by me on the following activities: development of treatment plan with patient and/or surrogate as well as nursing, discussions with consultants, evaluation of patient's response to treatment, examination of patient, obtaining history from patient or surrogate, ordering and performing treatments and interventions, ordering and review of laboratory studies, ordering and review of radiographic studies, pulse oximetry and re-evaluation of patient's condition.      Montine Circle, PA-C 08/31/2015 2145  Varney Biles, MD 09/18/2015 [phone]

## 2015-09-24 NOTE — ED Notes (Signed)
Pt pulled back 3 weeks ago, has been to his PCP since then. Reports pain is worse today and has not urinated in 8 hours. Has tried home pain medications with no relief. Pain radiates from back all the way around abd.

## 2015-09-24 NOTE — ED Notes (Signed)
Patient still very lethargic and difficult to arouse.  Katie from [facility] states patient should recieve 2600 mL.

## 2015-09-24 NOTE — ED Notes (Signed)
Now that central line has been inserted; we have applied B.A.I.R. Hugger.  His respirations are sonorous and he remains drowsy; so I insert a #7 nasopharyngeal airway in right nare with resultant clearing of respirations.

## 2015-09-24 NOTE — ED Notes (Signed)
Critical care at bedside  

## 2015-09-24 NOTE — H&P (Signed)
PULMONARY / CRITICAL CARE MEDICINE   Name: Paul Morton MRN: [id number] DOB: [birth year]    ADMISSION DATE:  09/17/2015  REFERRING MD :  EDP  CHIEF COMPLAINT:  Sepsis   INITIAL PRESENTATION: 54yo male smoker with recent diagnosis lymphoma (still in early stages of workup) who presented 9/25 with progressive back pain.  In ER found to be tachycardic, hypotensive with lactate 14, AKI and coagulopathy.  PCCM called to admit.    STUDIES:  PET 9/22>>> Bulky confluent hypermetabolic left cervical lymphadenopathy involving levels II-V. Given the presence of hepatomegaly with diffuse liver hypermetabolism, a diagnosis of lymphoma is favored, however metastatic carcinoma remains on the differential.  Hypermetabolic patchy ground-glass opacity and consolidation in both upper lobes and right lower lobe, favor an infectious or inflammatory etiology. MRI spine 9/25>>> incomplete exam, No evidence of significant spinal stenosis or cord compression in the cervical or visualized thoracic spine.   SIGNIFICANT EVENTS:    HISTORY OF PRESENT ILLNESS:  54yo male with recent diagnosis lymphoma (still in early stages of workup) who presented 9/25 with progressive back pain.  In ER found to be tachycardic, hypotensive with lactate 14, AKI and coagulopathy.  PCCM called to admit.   C/o back pain, heavy feeling in his legs and feeling that he needed to urinate but has been unable to do so.  Denies fevers, cough, hemoptysis, chest pain, bowel or bladder incontinence.   PAST MEDICAL HISTORY :   has a past medical history of Allergy.  has no past surgical history on file. Prior to Admission medications   Medication Sig Start Date End Date Taking? Authorizing Provider  cyclobenzaprine (FLEXERIL) 10 MG tablet Take 1 tablet (10 mg total) by mouth 3 (three) times daily as needed for muscle spasms. 09/08/15  Yes Roselee Culver, MD  EPINEPHrine 0.3 mg/0.3 mL IJ SOAJ injection Inject 0.3 mLs (0.3 mg total) into the  muscle once. 09/08/15  Yes Roselee Culver, MD  esomeprazole (NEXIUM) 20 MG capsule Take 20 mg by mouth daily at 12 noon.   Yes Historical Provider, MD  Multiple Vitamin (MULTIVITAMIN WITH MINERALS) TABS tablet Take 1 tablet by mouth daily.   Yes Historical Provider, MD  naproxen sodium (ANAPROX DS) 550 MG tablet Take 1 tablet (550 mg total) by mouth 2 (two) times daily with a meal. 09/08/15 09/07/16 Yes Roselee Culver, MD  predniSONE (DELTASONE) 20 MG tablet Take 3 PO QAM x3days, 2 PO QAM x3days, 1 PO QAM x3days 09/22/15  Yes Tereasa Coop, PA-C  traMADol (ULTRAM) 50 MG tablet Take 1 tablet (50 mg total) by mouth every 8 (eight) hours as needed. Patient taking differently: Take 50 mg by mouth every 8 (eight) hours as needed for moderate pain.  09/08/15  Yes Roselee Culver, MD   Allergies  Allergen Reactions  . Peanut-Containing Drug Products Anaphylaxis, Shortness Of Breath and Nausea And Vomiting    FAMILY HISTORY:  indicated that his mother is alive. He indicated that his father is alive. He indicated that both of his brothers are alive.  SOCIAL HISTORY:  reports that he has been smoking Cigarettes.  He has been smoking about 1.00 pack per day. He does not have any smokeless tobacco history on file. He reports that he drinks alcohol. He reports that he does not use illicit drugs.  REVIEW OF SYSTEMS:  As per HPI - All other systems reviewed and were neg.   SUBJECTIVE:   VITAL SIGNS: Temp:  [95.2 F (35.1 C)] 95.2  F (35.1 C) (09/25 1635) Pulse Rate:  [102-109] 102 (09/25 1915) Resp:  [8-24] 9 (09/25 1915) BP: (101-143)/(47-92) 126/67 mmHg (09/25 1915) SpO2:  [95 %-100 %] 98 % (09/25 1915) HEMODYNAMICS:   VENTILATOR SETTINGS:   INTAKE / OUTPUT: No intake or output data in the 24 hours ending 09/05/2015 1948  PHYSICAL EXAMINATION: General:  wdwn male, NAD  Neuro:  Somnolent post dilaudid for significant back pain, answers questions appropriately, MAE  HEENT:  Mm dry, no JVD   Cardiovascular:  s1s2 mild tachy Lungs:  resps even non labored on [place], few scattered crackles  Abdomen:  Round, soft, non tender Musculoskeletal:  Warm and dry, scant BLE edema   LABS:  CBC  Recent Labs Lab 09/12/2015 1632 09/26/2015 1656  WBC 18.6*  --   HGB 11.8* 15.0  HCT 38.9* 44.0  PLT 383  --    Coag's  Recent Labs Lab 09/11/2015 1630  APTT 34  INR 2.49*   BMET  Recent Labs Lab 09/20/2015 1632 09/23/2015 1656  NA 136 132*  K 6.4* 6.0*  CL 97* 102  CO2 10*  --   BUN 89* 80*  CREATININE 3.43* 3.50*  GLUCOSE 65 58*   Electrolytes  Recent Labs Lab 09/05/2015 1632  CALCIUM 8.5*   Sepsis Markers  Recent Labs Lab 09/23/2015 1648  LATICACIDVEN 14.88*   ABG No results for input(s): PHART, PCO2ART, PO2ART in the last 168 hours. Liver Enzymes  Recent Labs Lab 09/01/2015 1632  AST 389*  ALT 330*  ALKPHOS 266*  BILITOT 4.1*  ALBUMIN 2.6*   Cardiac Enzymes No results for input(s): TROPONINI, PROBNP in the last 168 hours. Glucose  Recent Labs Lab 09/21/15 0748 09/12/2015 1737 09/11/2015 1828  GLUCAP 95 44* 105*    Imaging Dg Chest 1 View  09/02/2015   CLINICAL DATA:  Shortness of breath today. Cervical lymphadenopathy. Central line placement. Initial encounter.  EXAM: CHEST 1 VIEW  COMPARISON:  PET-CT 09/21/2015  FINDINGS: 1831 hours. Right IJ central venous catheter tip is at the lower SVC level. No evidence of pneumothorax. Cardiomegaly appears unchanged. There is worsening aeration of the lungs with probable mild pulmonary edema. There is probable layering of the pleural effusions demonstrated on recent PET-CT. The bones appear unchanged.  IMPRESSION: Central line placement as described, tip at the lower SVC level. No pneumothorax. Cardiomegaly with edema and bilateral pleural effusions.   Electronically Signed   By: Richardean Sale M.D.   On: 09/25/2015 18:38   Mr Thoracic Spine Wo Contrast  09/20/2015   CLINICAL DATA:  Leg heaviness. Back injury 3 weeks  ago. Worsening pain today. Unable to urinate. Pain radiates from the back all the way around to the abdomen.  EXAM: MRI THORACIC SPINE WITHOUT CONTRAST; MRI CERVICAL SPINE WITHOUT CONTRAST  TECHNIQUE: Multiplanar and multiecho pulse sequences of the thoracic spine were obtained without intravenous contrast.; Multiplanar and multiecho pulse sequences of the cervical spine, to include the craniocervical junction and cervicothoracic junction, were obtained according to standard protocol without intravenous contrast.  COMPARISON:  PET-CT 09/21/2015.  Soft tissue neck CT 09/08/2015.  FINDINGS: The study was ordered as a metastasis screening protocol of the total spine. However, after sagittal T1 and sagittal STIR images were obtained of the cervical and upper thoracic spine, the examination was discontinued due to decreased oxygen saturation and critical lab values and the patient was returned to the emergency department.  Obtained sagittal images extend from the craniocervical junction to the T9 level. Mild motion artifact  is present through the cervical spine.  There is reversal of the normal cervical lordosis. Visualized portion of the thoracic spine demonstrates normal alignment. Mild-to-moderate multilevel disc degeneration is present in the mid and lower cervical spine with predominantly type 2 degenerative endplate marrow changes present, most notably at C6-7. Shallow disc protrusions/disc bulging and endplate spurring are present in the cervical spine, however there is no evidence of significant cervical spinal canal stenosis. A tiny disc protrusion is present at T5-6 without spinal stenosis seen in the visualized portion of the thoracic spine.  The visualized spinal cord is normal in caliber without definite signal abnormality identified, although motion limits evaluation in the cervical spine. There is no cord compression. Bulky left-sided cervical lymphadenopathy is more fully evaluated on prior neck CT.   IMPRESSION: Incomplete examination as above. Mild-to-moderate cervical disc degeneration. No evidence of significant spinal stenosis or cord compression in the cervical or visualized thoracic spine.   Electronically Signed   By: Logan Bores M.D.   On: 09/17/2015 18:27   Mr [facility] W/o Cm  09/16/2015   CLINICAL DATA:  Leg heaviness. Back injury 3 weeks ago. Worsening pain today. Unable to urinate. Pain radiates from the back all the way around to the abdomen.  EXAM: MRI THORACIC SPINE WITHOUT CONTRAST; MRI CERVICAL SPINE WITHOUT CONTRAST  TECHNIQUE: Multiplanar and multiecho pulse sequences of the thoracic spine were obtained without intravenous contrast.; Multiplanar and multiecho pulse sequences of the cervical spine, to include the craniocervical junction and cervicothoracic junction, were obtained according to standard protocol without intravenous contrast.  COMPARISON:  PET-CT 09/21/2015.  Soft tissue neck CT 09/08/2015.  FINDINGS: The study was ordered as a metastasis screening protocol of the total spine. However, after sagittal T1 and sagittal STIR images were obtained of the cervical and upper thoracic spine, the examination was discontinued due to decreased oxygen saturation and critical lab values and the patient was returned to the emergency department.  Obtained sagittal images extend from the craniocervical junction to the T9 level. Mild motion artifact is present through the cervical spine.  There is reversal of the normal cervical lordosis. Visualized portion of the thoracic spine demonstrates normal alignment. Mild-to-moderate multilevel disc degeneration is present in the mid and lower cervical spine with predominantly type 2 degenerative endplate marrow changes present, most notably at C6-7. Shallow disc protrusions/disc bulging and endplate spurring are present in the cervical spine, however there is no evidence of significant cervical spinal canal stenosis. A tiny disc protrusion is  present at T5-6 without spinal stenosis seen in the visualized portion of the thoracic spine.  The visualized spinal cord is normal in caliber without definite signal abnormality identified, although motion limits evaluation in the cervical spine. There is no cord compression. Bulky left-sided cervical lymphadenopathy is more fully evaluated on prior neck CT.  IMPRESSION: Incomplete examination as above. Mild-to-moderate cervical disc degeneration. No evidence of significant spinal stenosis or cord compression in the cervical or visualized thoracic spine.   Electronically Signed   By: Logan Bores M.D.   On: 09/26/2015 18:27     ASSESSMENT / PLAN:  PULMONARY Acute hypoxic respiratory failure  ?CAP  P:   Supplemental O2 as needed  F/u CXR  abx as below   CARDIOVASCULAR CVL R IJ CVL (EDP) 9/25>>> Sepsis  Hypotension  P:  abx as below  F/u lactate, pct   RENAL AKI  Hyperkalemia  Hyponatremia - mild  ?rhabdo  Lactic acidosis  ?urinary retention  P:  Volume -- 30cc/kg =2.6L F/u chem, lactate this pm  Place foley  U/a now  Renal u/s if u/a dirty  F/u CK  GASTROINTESTINAL Shock liver  P:   F/u LFT's  NPO for now   HEMATOLOGIC Coagulopathy  Leukocytosis  Anemia - mild  ??lymphoma - in early stages of w/u  P:  Oncology to see in am  F/u cbc   INFECTIOUS Sepsis -- CAP v UTI.  Suspicious for UTI/pyelo given sig back pain, urinary symptoms and ess neg MRI (although incomplete)  P:   BCx2 9/25>>> UC 9/25>>>  Vancomycin 9/25>>> Zosyn 9/25>>>  Cont broad spectrum abx for now  ENDOCRINE Hypoglycemia  P:   CBG q2 hr   NEUROLOGIC AMS - r/t sepsis, dilaudid  Back pain  P:   Hold further sedating meds  Narcan if needed - hold for now  F/u MRI when more stable to complete - r/o mets, epidural abscess  Was prescribed pred taper for back pain - will hold for now   FAMILY  - Updates:  Wife updated at bedside 9/25    Nickolas Madrid, NP 09/19/2015  7:48  PM Pager: [phone] or [phone]

## 2015-09-24 NOTE — ED Notes (Signed)
Pt began to drop O2 level, registering 84-94.  RN had MRI team stop evaluation of Pt and begin O2 at 3L.  Pt improved to a more steady 90-92%.  RN contacted ED charge RN to notify Pt status.  Decision to stop MRI evaluation and return Pt to ED.  Providers notified.  Pt O2 sat on portable ED telemetry stayed a constant 97% on 3L via [place] on return to ED.  BP 117/71

## 2015-09-24 NOTE — ED Notes (Signed)
IV team unable to start IV.

## 2015-09-24 NOTE — Progress Notes (Signed)
Pt was brought down to MRI for screening mets protocol. Pt was in the middle of the scan when RN came down and stopped exam due to low SATS and critical lab values that came back while pt was in the scanner. Spoke with radiologist dr Jeralyn Ruths and ED physician Dr Mariane Masters in which the decision was made to complete out what images were obtained and pt could return at a later date for lower section and axial images if they are warranted.

## 2015-09-24 NOTE — Progress Notes (Signed)
eLink Physician-Brief Progress Note Patient Name: Paul Morton DOB: 1961-11-04 MRN: [id number]   Date of Service  09/25/2015  HPI/Events of Note  Needs serial lactic acid, uric acid and cvp monitoring.   eICU Interventions  Will order: 1. Monitor CVP. 2. Uric Acid level in AM 3. Lactic Acid level at 2 AM and Q 3 hours X 3.     Intervention Category Minor Interventions: Clinical assessment - ordering diagnostic tests  Lysle Dingwall 09/11/2015, 11:35 PM

## 2015-09-24 NOTE — Progress Notes (Signed)
ANTIBIOTIC CONSULT NOTE - INITIAL  Pharmacy Consult for Vancomycin and Zosyn Indication: rule out sepsis  Allergies  Allergen Reactions  . Peanut-Containing Drug Products Anaphylaxis, Shortness Of Breath and Nausea And Vomiting    Patient Measurements:   As of 09/22/15: Height: 72 inches Weight: 87.5 kg  Vital Signs: Temp: 95.2 F (35.1 C) (09/25 1635) Temp Source: Rectal (09/25 1635) BP: 113/78 mmHg (09/25 1635) Pulse Rate: 107 (09/25 1635) Intake/Output from previous day:   Intake/Output from this shift:    Labs:  Recent Labs  09/18/2015 1632 09/20/2015 1656  WBC 18.6*  --   HGB 11.8* 15.0  PLT 383  --   CREATININE  --  3.50*   Estimated Creatinine Clearance: 26.2 mL/min (by C-G formula based on Cr of 3.5). No results for input(s): VANCOTROUGH, VANCOPEAK, VANCORANDOM, GENTTROUGH, GENTPEAK, GENTRANDOM, TOBRATROUGH, TOBRAPEAK, TOBRARND, AMIKACINPEAK, AMIKACINTROU, AMIKACIN in the last 72 hours.   Microbiology: No results found for this or any previous visit (from the past 720 hour(s)).  Medical History: Past Medical History  Diagnosis Date  . Allergy     Medications:  Scheduled:  . dextrose  1 ampule Intravenous Once  . ondansetron (ZOFRAN) IV  4 mg Intravenous Once   Infusions:  . piperacillin-tazobactam    . sodium chloride    . vancomycin     PRN:   Assessment: 54 yo male presents undergoing work up by Dr. Marin Olp for neck mass presents with worsening low back pain despite steroids and tramadol. Recent PET scan consistent with lymphoma, but metastatic carcinoma is not ruled out.  In ED, found to be hypothermic, tachycardic with elevated lactic acid and mottling of lower extremities noted. Pharmacy is consulted to dose vancomycin and Zosyn for presumed sepsis.  Goal of Therapy:  Vancomycin trough level 15-20 mcg/ml  Zosyn dose appropriate for renal function  Plan:   Vancomycin 1250mg  IV x 1, then 1g IV q24h Check trough at steady state Zosyn  3.375gm IV q8h (4hr extended infusions)  Follow renal function closely and adjust doses as needed Follow up cultures, clinical course  Peggyann Juba, PharmD, BCPS Pager: [phone] 09/13/2015,5:08 PM

## 2015-09-24 NOTE — Progress Notes (Signed)
eLink Physician-Brief Progress Note Patient Name: Paul Morton DOB: 12-08-1961 MRN: [id number]   Date of Service  09/23/2015  HPI/Events of Note  Labs reviewed. Hyperkalemia, hyperuricemia and high phos. 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  eICU Interventions  Fluid hydration. 2 more litres of NS. Continue D5 NS at 100cc/hr. One dose of rasburicase.  Difficult situation as he needs hydration, but has pulm edema and cardiomegaly. No echo on record. Will order stat echo. Monitor closely.     Praveen Mannam 09/22/2015, 9:58 PM

## 2015-09-24 NOTE — Progress Notes (Addendum)
[facility] Progress Note Patient Name: Paul Morton DOB: 26-Sep-1961 MRN: [id number]   Date of Service  09/06/2015  HPI/Events of Note  Multiple issues: 1. K+ = 6.4 >> 6.0 >> 5.8. CVP = 27 and 2. Oliguria.   eICU Interventions  Will order: 1. Lasix 80 mg IV X 1 now.      Intervention Category Major Interventions: Electrolyte abnormality - evaluation and management  Sommer,Steven Eugene 09/17/2015, 11:59 PM

## 2015-09-24 NOTE — ED Notes (Signed)
Patient transported to MRI 

## 2015-09-24 NOTE — ED Notes (Signed)
MD at bedside. 

## 2015-09-24 NOTE — ED Notes (Signed)
Patient returned from interrupted MRI due to O2 saturation.  Patient very diaphoretic and very lethargic.  CBG 44.

## 2015-09-24 NOTE — ED Notes (Signed)
Patient transported to CT 

## 2015-09-24 NOTE — ED Notes (Signed)
Pt found to be mottling on all 4 extremities.  RN unable to palpate pedal pulses.  Doppler used.  RN able to find pedal pulse on R with doppler.  Unable to find pedal pulse on L.  R [place] aware and was able to doppler + pedal bilaterally

## 2015-09-25 ENCOUNTER — Inpatient Hospital Stay (HOSPITAL_COMMUNITY): Payer: BLUE CROSS/BLUE SHIELD

## 2015-09-25 ENCOUNTER — Encounter (HOSPITAL_COMMUNITY): Payer: Self-pay

## 2015-09-25 DIAGNOSIS — M545 Low back pain, unspecified: Secondary | ICD-10-CM

## 2015-09-25 DIAGNOSIS — D689 Coagulation defect, unspecified: Secondary | ICD-10-CM

## 2015-09-25 DIAGNOSIS — R7989 Other specified abnormal findings of blood chemistry: Secondary | ICD-10-CM

## 2015-09-25 DIAGNOSIS — I5021 Acute systolic (congestive) heart failure: Secondary | ICD-10-CM

## 2015-09-25 DIAGNOSIS — R1909 Other intra-abdominal and pelvic swelling, mass and lump: Secondary | ICD-10-CM

## 2015-09-25 DIAGNOSIS — N179 Acute kidney failure, unspecified: Secondary | ICD-10-CM

## 2015-09-25 DIAGNOSIS — N888 Other specified noninflammatory disorders of cervix uteri: Secondary | ICD-10-CM

## 2015-09-25 DIAGNOSIS — J9600 Acute respiratory failure, unspecified whether with hypoxia or hypercapnia: Secondary | ICD-10-CM | POA: Insufficient documentation

## 2015-09-25 DIAGNOSIS — N170 Acute kidney failure with tubular necrosis: Secondary | ICD-10-CM

## 2015-09-25 DIAGNOSIS — R945 Abnormal results of liver function studies: Secondary | ICD-10-CM

## 2015-09-25 DIAGNOSIS — C801 Malignant (primary) neoplasm, unspecified: Secondary | ICD-10-CM

## 2015-09-25 DIAGNOSIS — R591 Generalized enlarged lymph nodes: Secondary | ICD-10-CM | POA: Insufficient documentation

## 2015-09-25 DIAGNOSIS — J80 Acute respiratory distress syndrome: Secondary | ICD-10-CM

## 2015-09-25 DIAGNOSIS — E872 Acidosis, unspecified: Secondary | ICD-10-CM

## 2015-09-25 DIAGNOSIS — R52 Pain, unspecified: Secondary | ICD-10-CM

## 2015-09-25 DIAGNOSIS — R570 Cardiogenic shock: Secondary | ICD-10-CM

## 2015-09-25 DIAGNOSIS — K759 Inflammatory liver disease, unspecified: Secondary | ICD-10-CM

## 2015-09-25 DIAGNOSIS — J9601 Acute respiratory failure with hypoxia: Secondary | ICD-10-CM

## 2015-09-25 DIAGNOSIS — K920 Hematemesis: Secondary | ICD-10-CM | POA: Insufficient documentation

## 2015-09-25 DIAGNOSIS — N289 Disorder of kidney and ureter, unspecified: Secondary | ICD-10-CM

## 2015-09-25 DIAGNOSIS — K7200 Acute and subacute hepatic failure without coma: Secondary | ICD-10-CM

## 2015-09-25 LAB — CK
CK TOTAL: 938 U/L — AB (ref 49–397)
Total CK: 1111 U/L — ABNORMAL HIGH (ref 49–397)

## 2015-09-25 LAB — BLOOD GAS, ARTERIAL
ACID-BASE DEFICIT: 15.5 mmol/L — AB (ref 0.0–2.0)
ACID-BASE DEFICIT: 8 mmol/L — AB (ref 0.0–2.0)
BICARBONATE: 10.4 meq/L — AB (ref 20.0–24.0)
BICARBONATE: 17.1 meq/L — AB (ref 20.0–24.0)
DRAWN BY: 257701
DRAWN BY: 31996
FIO2: 0.4
O2 CONTENT: 2 L/min
O2 SAT: 92.9 %
O2 Saturation: 92.3 %
PATIENT TEMPERATURE: 37
PEEP: 5 cmH2O
PH ART: 7.311 — AB (ref 7.350–7.450)
Patient temperature: 98.6
RATE: 26 resp/min
TCO2: 9.9 mmol/L (ref 0–100)
TCO2: 18.1 mmol/L (ref 0–100)
VT: 600 mL
pCO2 arterial: 25.4 mmHg — ABNORMAL LOW (ref 35.0–45.0)
pCO2 arterial: 34.8 mmHg — ABNORMAL LOW (ref 35.0–45.0)
pH, Arterial: 7.237 — ABNORMAL LOW (ref 7.350–7.450)
pO2, Arterial: 83.6 mmHg (ref 80.0–100.0)
pO2, Arterial: 78.1 mmHg — ABNORMAL LOW (ref 80.0–100.0)

## 2015-09-25 LAB — CARBOXYHEMOGLOBIN
Carboxyhemoglobin: 1.5 % (ref 0.5–1.5)
Carboxyhemoglobin: 1.9 % — ABNORMAL HIGH (ref 0.5–1.5)
METHEMOGLOBIN: 0.7 % (ref 0.0–1.5)
Methemoglobin: 0.9 % (ref 0.0–1.5)
O2 SAT: 55.1 %
O2 SAT: 74.5 %
TOTAL HEMOGLOBIN: 10.7 g/dL — AB (ref 13.5–18.0)
TOTAL HEMOGLOBIN: 9.6 g/dL — AB (ref 13.5–18.0)

## 2015-09-25 LAB — CBC
HCT: 35.9 % — ABNORMAL LOW (ref 39.0–52.0)
HEMOGLOBIN: 10.9 g/dL — AB (ref 13.0–17.0)
MCH: 24.2 pg — ABNORMAL LOW (ref 26.0–34.0)
MCHC: 30.4 g/dL (ref 30.0–36.0)
MCV: 79.6 fL (ref 78.0–100.0)
PLATELETS: 335 10*3/uL (ref 150–400)
RBC: 4.51 MIL/uL (ref 4.22–5.81)
RDW: 17.8 % — ABNORMAL HIGH (ref 11.5–15.5)
WBC: 24.8 10*3/uL — AB (ref 4.0–10.5)

## 2015-09-25 LAB — PROTIME-INR
INR: 2.61 — ABNORMAL HIGH (ref 0.00–1.49)
INR: 2.75 — AB (ref 0.00–1.49)
Prothrombin Time: 27.6 seconds — ABNORMAL HIGH (ref 11.6–15.2)
Prothrombin Time: 28.6 seconds — ABNORMAL HIGH (ref 11.6–15.2)

## 2015-09-25 LAB — CBC WITH DIFFERENTIAL/PLATELET
BASOS ABS: 0 10*3/uL (ref 0.0–0.1)
BLASTS: 0 %
Band Neutrophils: 0 %
Basophils Relative: 0 %
EOS ABS: 0 10*3/uL (ref 0.0–0.7)
Eosinophils Relative: 0 %
HCT: 34.9 % — ABNORMAL LOW (ref 39.0–52.0)
HEMOGLOBIN: 11 g/dL — AB (ref 13.0–17.0)
LYMPHS PCT: 3 %
Lymphs Abs: 0.5 10*3/uL — ABNORMAL LOW (ref 0.7–4.0)
MCH: 24.7 pg — ABNORMAL LOW (ref 26.0–34.0)
MCHC: 31.5 g/dL (ref 30.0–36.0)
MCV: 78.3 fL (ref 78.0–100.0)
METAMYELOCYTES PCT: 0 %
Monocytes Absolute: 0.8 10*3/uL (ref 0.1–1.0)
Monocytes Relative: 5 %
Myelocytes: 0 %
Neutro Abs: 13.7 10*3/uL — ABNORMAL HIGH (ref 1.7–7.7)
Neutrophils Relative %: 92 %
Other: 0 %
PROMYELOCYTES ABS: 0 %
Platelets: 327 10*3/uL (ref 150–400)
RBC: 4.46 MIL/uL (ref 4.22–5.81)
RDW: 17.8 % — ABNORMAL HIGH (ref 11.5–15.5)
WBC MORPHOLOGY: INCREASED
WBC: 15 10*3/uL — ABNORMAL HIGH (ref 4.0–10.5)
nRBC: 0 /100 WBC

## 2015-09-25 LAB — COMPREHENSIVE METABOLIC PANEL
ALT: 347 U/L — AB (ref 17–63)
ANION GAP: 19 — AB (ref 5–15)
AST: 649 U/L — AB (ref 15–41)
Albumin: 2 g/dL — ABNORMAL LOW (ref 3.5–5.0)
Alkaline Phosphatase: 215 U/L — ABNORMAL HIGH (ref 38–126)
BUN: 106 mg/dL — AB (ref 6–20)
CALCIUM: 7 mg/dL — AB (ref 8.9–10.3)
CO2: 18 mmol/L — ABNORMAL LOW (ref 22–32)
Chloride: 103 mmol/L (ref 101–111)
Creatinine, Ser: 4.23 mg/dL — ABNORMAL HIGH (ref 0.61–1.24)
GFR calc Af Amer: 17 mL/min — ABNORMAL LOW (ref >60)
GFR calc non Af Amer: 14 mL/min — ABNORMAL LOW (ref >60)
GLUCOSE: 148 mg/dL — AB (ref 65–99)
Potassium: 5.5 mmol/L — ABNORMAL HIGH (ref 3.5–5.1)
SODIUM: 140 mmol/L (ref 135–145)
TOTAL PROTEIN: 5.2 g/dL — AB (ref 6.5–8.1)
Total Bilirubin: 5.1 mg/dL — ABNORMAL HIGH (ref 0.3–1.2)

## 2015-09-25 LAB — GLUCOSE, CAPILLARY
GLUCOSE-CAPILLARY: 110 mg/dL — AB (ref 65–99)
GLUCOSE-CAPILLARY: 113 mg/dL — AB (ref 65–99)
GLUCOSE-CAPILLARY: 116 mg/dL — AB (ref 65–99)
GLUCOSE-CAPILLARY: 117 mg/dL — AB (ref 65–99)
GLUCOSE-CAPILLARY: 122 mg/dL — AB (ref 65–99)
GLUCOSE-CAPILLARY: 151 mg/dL — AB (ref 65–99)
Glucose-Capillary: 118 mg/dL — ABNORMAL HIGH (ref 65–99)

## 2015-09-25 LAB — BASIC METABOLIC PANEL
ANION GAP: 22 — AB (ref 5–15)
BUN: 96 mg/dL — ABNORMAL HIGH (ref 6–20)
CALCIUM: 7.3 mg/dL — AB (ref 8.9–10.3)
CHLORIDE: 103 mmol/L (ref 101–111)
CO2: 14 mmol/L — AB (ref 22–32)
Creatinine, Ser: 3.47 mg/dL — ABNORMAL HIGH (ref 0.61–1.24)
GFR calc non Af Amer: 18 mL/min — ABNORMAL LOW (ref >60)
GFR, EST AFRICAN AMERICAN: 21 mL/min — AB (ref >60)
Glucose, Bld: 145 mg/dL — ABNORMAL HIGH (ref 65–99)
POTASSIUM: 6.2 mmol/L — AB (ref 3.5–5.1)
Sodium: 139 mmol/L (ref 135–145)

## 2015-09-25 LAB — CK TOTAL AND CKMB (NOT AT ARMC)
CK, MB: 73.5 ng/mL — ABNORMAL HIGH (ref 0.5–5.0)
RELATIVE INDEX: 7 — AB (ref 0.0–2.5)
Total CK: 1052 U/L — ABNORMAL HIGH (ref 49–397)

## 2015-09-25 LAB — HEPATIC FUNCTION PANEL
ALBUMIN: 2.3 g/dL — AB (ref 3.5–5.0)
ALT: 345 U/L — ABNORMAL HIGH (ref 17–63)
AST: 701 U/L — AB (ref 15–41)
Alkaline Phosphatase: 230 U/L — ABNORMAL HIGH (ref 38–126)
BILIRUBIN TOTAL: 4.3 mg/dL — AB (ref 0.3–1.2)
Bilirubin, Direct: 3.1 mg/dL — ABNORMAL HIGH (ref 0.1–0.5)
Indirect Bilirubin: 1.2 mg/dL — ABNORMAL HIGH (ref 0.3–0.9)
TOTAL PROTEIN: 5.8 g/dL — AB (ref 6.5–8.1)

## 2015-09-25 LAB — TYPE AND SCREEN
ABO/RH(D): O POS
ABO/RH(D): O POS
ANTIBODY SCREEN: NEGATIVE
Antibody Screen: NEGATIVE

## 2015-09-25 LAB — ABO/RH
ABO/RH(D): O POS
ABO/RH(D): O POS

## 2015-09-25 LAB — URIC ACID: URIC ACID, SERUM: 9.1 mg/dL — AB (ref 4.4–7.6)

## 2015-09-25 LAB — MRSA PCR SCREENING: MRSA BY PCR: NEGATIVE

## 2015-09-25 LAB — MAGNESIUM: MAGNESIUM: 2.8 mg/dL — AB (ref 1.7–2.4)

## 2015-09-25 LAB — CORTISOL: Cortisol, Plasma: 51.4 ug/dL

## 2015-09-25 LAB — AMMONIA: AMMONIA: 37 umol/L — AB (ref 9–35)

## 2015-09-25 LAB — LACTIC ACID, PLASMA
LACTIC ACID, VENOUS: 7.9 mmol/L — AB (ref 0.5–2.0)
LACTIC ACID, VENOUS: 8.4 mmol/L — AB (ref 0.5–2.0)

## 2015-09-25 LAB — PHOSPHORUS: PHOSPHORUS: 11.1 mg/dL — AB (ref 2.5–4.6)

## 2015-09-25 LAB — ACETAMINOPHEN LEVEL

## 2015-09-25 LAB — SALICYLATE LEVEL

## 2015-09-25 MED ORDER — DEXTROSE 5 % IV SOLN
INTRAVENOUS | Status: DC
  Administered 2015-09-25 – 2015-09-27 (×4): via INTRAVENOUS
  Filled 2015-09-25 (×6): qty 150

## 2015-09-25 MED ORDER — FENTANYL CITRATE (PF) 100 MCG/2ML IJ SOLN: 50.0000 ug | Freq: Once | INTRAMUSCULAR | Status: DC

## 2015-09-25 MED ORDER — FENTANYL CITRATE (PF) 100 MCG/2ML IJ SOLN
INTRAMUSCULAR | Status: AC
  Filled 2015-09-25: qty 2

## 2015-09-25 MED ORDER — FUROSEMIDE 10 MG/ML IJ SOLN
160.0000 mg | Freq: Three times a day (TID) | INTRAVENOUS | Status: DC
  Administered 2015-09-25 – 2015-09-28 (×9): 160 mg via INTRAVENOUS
  Filled 2015-09-25 (×5): qty 16
  Filled 2015-09-25: qty 10
  Filled 2015-09-25 (×7): qty 16

## 2015-09-25 MED ORDER — SODIUM CHLORIDE 0.9 % IJ SOLN: 9.0000 mL | INTRAMUSCULAR | Status: DC | PRN

## 2015-09-25 MED ORDER — MIDAZOLAM HCL 2 MG/2ML IJ SOLN
2.0000 mg | INTRAMUSCULAR | Status: DC | PRN
  Administered 2015-09-25 – 2015-09-27 (×2): 2 mg via INTRAVENOUS
  Filled 2015-09-25 (×5): qty 2

## 2015-09-25 MED ORDER — DEXAMETHASONE SODIUM PHOSPHATE 100 MG/10ML IJ SOLN
40.0000 mg | INTRAMUSCULAR | Status: AC
  Administered 2015-09-25 – 2015-09-27 (×3): 40 mg via INTRAVENOUS
  Filled 2015-09-25 (×5): qty 4

## 2015-09-25 MED ORDER — SODIUM CHLORIDE 0.9 % IV SOLN
INTRAVENOUS | Status: DC
  Administered 2015-09-25: 08:00:00 via INTRAVENOUS
  Administered 2015-10-02 – 2015-10-04 (×2): 10 mL/h via INTRAVENOUS
  Administered 2015-10-08 – 2015-10-09 (×2): via INTRAVENOUS

## 2015-09-25 MED ORDER — SODIUM CHLORIDE 0.9 % IV SOLN: Freq: Once | INTRAVENOUS | Status: DC

## 2015-09-25 MED ORDER — SUCCINYLCHOLINE CHLORIDE 20 MG/ML IJ SOLN
INTRAMUSCULAR | Status: AC
  Filled 2015-09-25: qty 1

## 2015-09-25 MED ORDER — BISACODYL 10 MG RE SUPP: 10.0000 mg | Freq: Every day | RECTAL | Status: DC | PRN

## 2015-09-25 MED ORDER — SODIUM CHLORIDE 0.9 % IV SOLN
8.0000 mg/h | INTRAVENOUS | Status: AC
  Administered 2015-09-25 – 2015-09-28 (×6): 8 mg/h via INTRAVENOUS
  Filled 2015-09-25 (×13): qty 80

## 2015-09-25 MED ORDER — DIPHENHYDRAMINE HCL 12.5 MG/5ML PO ELIX: 12.5000 mg | ORAL_SOLUTION | Freq: Four times a day (QID) | ORAL | Status: DC | PRN

## 2015-09-25 MED ORDER — SODIUM POLYSTYRENE SULFONATE 15 GM/60ML PO SUSP
30.0000 g | Freq: Once | ORAL | Status: AC
Start: 2015-09-25 — End: 2015-09-25
  Administered 2015-09-25: 30 g via ORAL
  Filled 2015-09-25: qty 120

## 2015-09-25 MED ORDER — VITAMIN K1 10 MG/ML IJ SOLN
10.0000 mg | Freq: Once | INTRAVENOUS | Status: AC
  Administered 2015-09-25: 10 mg via INTRAVENOUS
  Filled 2015-09-25: qty 1

## 2015-09-25 MED ORDER — SODIUM CHLORIDE 0.9 % IV SOLN
Freq: Once | INTRAVENOUS | Status: AC
  Administered 2015-09-25: 18:00:00 via INTRAVENOUS

## 2015-09-25 MED ORDER — DIPHENHYDRAMINE HCL 50 MG/ML IJ SOLN: 12.5000 mg | Freq: Four times a day (QID) | INTRAMUSCULAR | Status: DC | PRN

## 2015-09-25 MED ORDER — ONDANSETRON HCL 4 MG/2ML IJ SOLN: 4.0000 mg | Freq: Four times a day (QID) | INTRAMUSCULAR | Status: DC | PRN

## 2015-09-25 MED ORDER — DEXAMETHASONE SODIUM PHOSPHATE 4 MG/ML IJ SOLN: 40.0000 mg | INTRAMUSCULAR | Status: DC

## 2015-09-25 MED ORDER — ETOMIDATE 2 MG/ML IV SOLN
INTRAVENOUS | Status: AC
  Administered 2015-09-25: 20 mg
  Filled 2015-09-25: qty 20

## 2015-09-25 MED ORDER — FENTANYL BOLUS VIA INFUSION
50.0000 ug | INTRAVENOUS | Status: DC | PRN
  Administered 2015-09-27 (×2): 50 ug via INTRAVENOUS
  Filled 2015-09-25: qty 50

## 2015-09-25 MED ORDER — MIDAZOLAM HCL 2 MG/2ML IJ SOLN
2.0000 mg | INTRAMUSCULAR | Status: DC | PRN
  Administered 2015-09-26: 2 mg via INTRAVENOUS

## 2015-09-25 MED ORDER — FENTANYL CITRATE (PF) 100 MCG/2ML IJ SOLN
50.0000 ug | INTRAMUSCULAR | Status: DC | PRN
Start: 2015-09-25 — End: 2015-09-25
  Administered 2015-09-25: 50 ug via INTRAVENOUS

## 2015-09-25 MED ORDER — SODIUM CHLORIDE 0.9 % IV SOLN
25.0000 ug/h | INTRAVENOUS | Status: DC
  Administered 2015-09-25: 150 ug/h via INTRAVENOUS
  Administered 2015-09-25: 50 ug/h via INTRAVENOUS
  Administered 2015-09-26: 300 ug/h via INTRAVENOUS
  Administered 2015-09-26: 100 ug/h via INTRAVENOUS
  Administered 2015-09-27: 300 ug/h via INTRAVENOUS
  Administered 2015-09-27: 250 ug/h via INTRAVENOUS
  Administered 2015-09-28: 300 ug/h via INTRAVENOUS
  Administered 2015-09-28 – 2015-09-29 (×3): 400 ug/h via INTRAVENOUS
  Filled 2015-09-25 (×11): qty 50

## 2015-09-25 MED ORDER — FENTANYL CITRATE (PF) 100 MCG/2ML IJ SOLN
50.0000 ug | INTRAMUSCULAR | Status: DC | PRN
  Administered 2015-09-25: 100 ug via INTRAVENOUS
  Administered 2015-09-25: 50 ug via INTRAVENOUS
  Administered 2015-09-25 (×2): 100 ug via INTRAVENOUS
  Filled 2015-09-25 (×2): qty 2

## 2015-09-25 MED ORDER — LIDOCAINE HCL (CARDIAC) 20 MG/ML IV SOLN
INTRAVENOUS | Status: AC
  Filled 2015-09-25: qty 5

## 2015-09-25 MED ORDER — MILRINONE IN DEXTROSE 20 MG/100ML IV SOLN
0.2500 ug/kg/min | INTRAVENOUS | Status: DC
  Administered 2015-09-25 – 2015-09-28 (×6): 0.25 ug/kg/min via INTRAVENOUS
  Filled 2015-09-25 (×7): qty 100

## 2015-09-25 MED ORDER — ROCURONIUM BROMIDE 50 MG/5ML IV SOLN
INTRAVENOUS | Status: AC
  Administered 2015-09-25: 40 mg
  Filled 2015-09-25: qty 2

## 2015-09-25 MED ORDER — NOREPINEPHRINE BITARTRATE 1 MG/ML IV SOLN
0.0000 ug/min | INTRAVENOUS | Status: DC
  Administered 2015-09-25: 2 ug/min via INTRAVENOUS
  Administered 2015-09-26: 10 ug/min via INTRAVENOUS
  Filled 2015-09-25 (×2): qty 4

## 2015-09-25 MED ORDER — MIDAZOLAM HCL 2 MG/2ML IJ SOLN
INTRAMUSCULAR | Status: AC
  Administered 2015-09-25: 2 mg
  Filled 2015-09-25: qty 4

## 2015-09-25 MED ORDER — FENTANYL 10 MCG/ML IV SOLN
INTRAVENOUS | Status: DC
  Administered 2015-09-25: 08:00:00 via INTRAVENOUS
  Filled 2015-09-25: qty 50

## 2015-09-25 MED ORDER — NALOXONE HCL 0.4 MG/ML IJ SOLN: 0.4000 mg | INTRAMUSCULAR | Status: DC | PRN

## 2015-09-25 MED ORDER — FENTANYL CITRATE (PF) 100 MCG/2ML IJ SOLN
INTRAMUSCULAR | Status: AC
  Administered 2015-09-25: 100 ug
  Filled 2015-09-25: qty 4

## 2015-09-25 MED ORDER — SENNOSIDES 8.8 MG/5ML PO SYRP
5.0000 mL | ORAL_SOLUTION | Freq: Two times a day (BID) | ORAL | Status: DC | PRN
  Filled 2015-09-25: qty 5

## 2015-09-25 NOTE — Progress Notes (Signed)
*  Preliminary Results* Bilateral lower extremity venous duplex completed. Bilateral lower extremities are negative for deep vein thrombosis. There is no evidence of Baker's cyst bilaterally.  09/25/2015  Maudry Mayhew, RVT, RDCS, RDMS

## 2015-09-25 NOTE — Consult Note (Signed)
Referral MD  Reason for Referral: Sepsis, likely lymphoma  Chief Complaint  Patient presents with  . Back Pain  . Abdominal Pain  : My back is killing me.  HPI: [name] is well-known to me. We actually saw him for the first time in her office back on September 13. He presented to [place] with cervical lymphadenopathy.  With his workup, he had a PET scan done. He has extensive cervical with adenopathy. He has hepatomegaly with extensive metabolic involvement.  When saw him, he was iron deficient. I thought that he probably had colon cancer. However, the PET scan was negative.  He now is admitted with severe back pain. He did have MRI of the back. The result was not optimal. He had chest x-ray done.  His labs are quite abnormal. He has markedly elevated LFTs. His CK is 1111. He had elevated lactic acid. His uric acid was somewhat elevated. Has renal insufficiency. 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  I think it is clear that his cancer is "driving" all of this. He was scheduled for a biopsy last week but this was canceled. He definitely needs a biopsy now.  His LDH is elevated. Again, I have to believe that he has some form of lymphoma versus Hodgkin's disease.  His appetite is down. He's had some nausea but no vomiting. He's had no obvious bleeding. He's had no change in bowel or bladder habits.     Past Medical History  Diagnosis Date  . Allergy   :  History reviewed. No pertinent past surgical history.:   Current facility-administered medications:  .  albuterol (PROVENTIL) (2.5 MG/3ML) 0.083% nebulizer solution 2.5 mg, 2.5 mg, Nebulization, Q2H PRN, Marijean Heath, NP .  dextrose 5 %-0.9 % sodium chloride infusion, , Intravenous, Continuous, Praveen Mannam, MD, Last Rate: 100 mL/hr at 09/19/2015 2215 .  fentaNYL (SUBLIMAZE) injection 50-100 mcg, 50-100 mcg, Intravenous, Q1H PRN, Anders Simmonds, MD, 100 mcg at 09/25/15 0618 .  ondansetron (ZOFRAN) injection 4 mg, 4  mg, Intravenous, Q6H PRN, Marijean Heath, NP .  pantoprazole (PROTONIX) injection 40 mg, 40 mg, Intravenous, QHS, Marijean Heath, NP, 40 mg at 09/17/2015 2240 .  piperacillin-tazobactam (ZOSYN) IVPB 3.375 g, 3.375 g, Intravenous, Q8H, Emiliano Dyer, RPH, 3.375 g at 09/25/15 0400 .  vancomycin (VANCOCIN) IVPB 1000 mg/200 mL premix, 1,000 mg, Intravenous, Q24H, Emiliano Dyer, RPH  Facility-Administered Medications Ordered in Other Encounters:  .  fludeoxyglucose F - 18 (FDG) injection 9.1 milli Curie, 9.1 milli Curie, Intravenous, Once PRN, Medication Radiologist, MD, 9.1 milli Curie at 09/21/15 0752:  . pantoprazole (PROTONIX) IV  40 mg Intravenous QHS  . piperacillin-tazobactam (ZOSYN)  IV  3.375 g Intravenous Q8H  . vancomycin  1,000 mg Intravenous Q24H  :  Allergies  Allergen Reactions  . Peanut-Containing Drug Products Anaphylaxis, Shortness Of Breath and Nausea And Vomiting  :  History reviewed. No pertinent family history.:  Social History   Social History  . Marital Status: Married    Spouse Name: N/A  . Number of Children: N/A  . Years of Education: N/A   Occupational History  . Not on file.   Social History Main Topics  . Smoking status: Current Every Day Smoker -- 1.00 packs/day    Types: Cigarettes  . Smokeless tobacco: Not on file  . Alcohol Use: 0.0 oz/week    0 Standard drinks or equivalent per week  . Drug Use: No  . Sexual Activity: Not on  file   Other Topics Concern  . Not on file   Social History Narrative  :  Pertinent items are noted in HPI.  Exam: Patient Vitals for the past 24 hrs:  BP Temp Temp src Pulse Resp SpO2 Height Weight  09/25/15 0600 100/75 mmHg 97.2 F (36.2 C) - (!) 108 (!) 22 93 % - -  09/25/15 0500 114/89 mmHg 97.3 F (36.3 C) - (!) 108 14 97 % - -  09/25/15 0426 - - - - - - - 195 lb 8.8 oz (88.7 kg)  09/25/15 0400 119/90 mmHg 97.5 F (36.4 C) Core (!) 109 13 100 % - -  09/25/15 0300 126/78 mmHg 97.5 F  (36.4 C) - (!) 117 (!) 34 92 % - -  09/25/15 0200 114/79 mmHg 97.7 F (36.5 C) - (!) 109 13 97 % - -  09/25/15 0100 116/81 mmHg 97.7 F (36.5 C) - (!) 108 (!) 9 97 % - -  09/25/15 0000 113/73 mmHg 97.5 F (36.4 C) - (!) 106 (!) 9 95 % - -  09/06/2015 2300 109/79 mmHg 97.2 F (36.2 C) - (!) 106 (!) 8 95 % - -  09/05/2015 2200 - 97 F (36.1 C) - (!) 104 (!) 8 97 % - -  09/06/2015 2100 - - - - - - 6' (1.829 m) 195 lb 8.8 oz (88.7 kg)  09/15/2015 2015 110/72 mmHg (!) 95.3 F (35.2 C) - 107 13 99 % - -  08/31/2015 2000 106/62 mmHg - - 105 (!) 8 99 % - -  09/29/2015 1945 109/69 mmHg - - 103 (!) 9 97 % - -  09/18/2015 1930 110/64 mmHg - - 104 (!) 9 97 % - -  09/02/2015 1915 126/67 mmHg - - 102 (!) 9 98 % - -  09/12/2015 1900 118/92 mmHg - - 103 (!) 8 99 % - -  09/15/2015 1845 131/76 mmHg - - 104 (!) 9 98 % - -  09/28/2015 1830 137/62 mmHg - - 106 17 95 % - -  09/08/2015 1815 108/78 mmHg - - 108 24 100 % - -  09/16/2015 1800 119/56 mmHg - - 106 13 99 % - -  09/19/2015 1749 101/57 mmHg - - 106 14 98 % - -  09/25/2015 1735 143/72 mmHg - - - 15 - - -  09/18/2015 1635 - (!) 95.2 F (35.1 C) Rectal - - - - -  09/01/2015 1635 113/78 mmHg (!) 95.2 F (35.1 C) Rectal 107 16 97 % - -  09/03/2015 1412 (!) 106/47 mmHg - - 109 22 100 % - -    somewhat ill-appearing white male. He is alert. He is oriented. Head and neck exam shows the adenopathy in the left neck. There are no oral lesions. Oral mucosa is dry. Lungs are slightly decreased in the bases. Cardiac exam regular rate and rhythm with no murmurs, rubs or bruits. Abdomen is soft. His liver edge is just below the right costal margin. There is no splenomegaly. He has no obvious fluid wave. Extremities shows compression boots on his lower legs. Skin exam shows no rashes. Neurological exam is nonfocal.    Recent Labs  09/14/2015 1632 09/23/2015 1656 09/25/15 0400  WBC 18.6*  --  24.8*  HGB 11.8* 15.0 10.9*  HCT 38.9* 44.0 35.9*  PLT 383  --  335    Recent Labs  09/19/2015 2237  09/25/15 0400  NA 137 139  K 5.8* 6.2*  CL 103 103  CO2 13* 14*  GLUCOSE 114* 145*  BUN 92* 96*  CREATININE 3.19* 3.47*  CALCIUM 7.6* 7.3*    Blood smear review:  None  Pathology: None     Assessment and Plan:  Paul Morton is a 54 year old white male. Again, I have to believe that everything is being "ringing" by this malignancy.  We must get biopsies. I have to believe that this is a lymphoproliferative process. I suspect that if we get him on some steroids, this will help. I want to try to avoid until we can get a biopsy. He is relatively hemodynamically stable right now.  He did get rasburicase for some elevated uric acid. Per he has renal insufficiency. He may have rhabdomyolysis, given the elevated CK.  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 I suppose it could be a Burkitt's type of lymphoma.  He will need to have a colonoscopy while in the hospital. He is iron deficient when we checked him. He has never had a colonoscopy. Even though the PET scan was negative, I still feel that a colonoscopy when he is more stable will help come "complete the picture".  His father-in-law was with him.  Brief talk about what needs to be done.  He has a central line in right now. He will need to have a Port-A-Cath placed at some point.  I anticipate that he will need inpatient treatment for this malignancy. We will plan accordingly depending on the results from his biopsies.  He will need to have an echocardiogram done. I will set this up.  I totally appreciated everybody's care of him in the ICU. I know that he is the best hands possible!!  Pete E.  Ephesians 6:16

## 2015-09-25 NOTE — Progress Notes (Signed)
eLink Physician-Brief Progress Note Patient Name: Paul Morton DOB: December 30, 1961 MRN: [id number]   Date of Service  09/25/2015  HPI/Events of Note  CHF acute systolic HF on Echo  EF 7%  eICU Interventions  See problem list addition     Intervention Category Major Interventions: Other: CDI documentation  Asencion Noble 09/25/2015, 5:40 PM

## 2015-09-25 NOTE — Progress Notes (Signed)
Approximately 900cc of brown/dark red fluid suctioned through OG tube during intubation.

## 2015-09-25 NOTE — Clinical Documentation Improvement (Signed)
Critical Care  Can the diagnosis of CHF be further specified?    Acuity - Acute, Chronic, Acute on Chronic   Type - Systolic, Diastolic, Systolic and Diastolic  Other  Clinically Undetermined  Document any associated diagnoses/conditions  Supporting Information: (As per notes) Pulmonary Edema  CHF - preliminary EF on ECHO 7%   Please exercise your independent, professional judgment when responding. A specific answer is not anticipated or expected.  Thank You, Alessandra Grout, RN, BSN, CCDS,Clinical Documentation Specialist:  [phone]  [phone]=[facility]- Health Information Management

## 2015-09-25 NOTE — Progress Notes (Signed)
Intubation Procedure Note Paul Morton [id number] March 12, 1961  Procedure: Intubation Indications: Respiratory insufficiency  Procedure Details Consent: Risks of procedure as well as the alternatives and risks of each were explained to the (patient/caregiver).  Consent for procedure obtained. Time Out: Verified patient identification, verified procedure, site/side was marked, verified correct patient position, special equipment/implants available, medications/allergies/relevent history reviewed, required imaging and test results available.  Performed  3  Evaluation Hemodynamic Status: BP stable throughout; O2 sats: transiently fell during during procedure Patient's Current Condition: stable Complications: No apparent complications Patient did tolerate procedure well. Chest X-ray ordered to verify placement.  CXR: pending.   Paul Morton 09/25/2015

## 2015-09-25 NOTE — Progress Notes (Signed)
  Echocardiogram 2D Echocardiogram has been performed.  Paul Morton 09/25/2015, 8:47 AM

## 2015-09-25 NOTE — Progress Notes (Signed)
eLink Physician-Brief Progress Note Patient Name: Paul Morton DOB: [birth year] MRN: [id number]   Date of Service  09/25/2015  HPI/Events of Note  Pain not improved with Fentanyl 50 mcg IV Q 2 hours.   eICU Interventions  Will increase Fentanyl dose to 50 - 100 mcg IV Q 1 hour PRN.     Intervention Category Intermediate Interventions: Pain - evaluation and management  Sommer,Steven Eugene 09/25/2015, 3:54 AM

## 2015-09-25 NOTE — Consult Note (Addendum)
Referring Provider: Critical care Primary Care Physician:  No PCP Per Patient Primary Gastroenterologist:  unassigned  Reason for Consultation:    Hematemesis, UGI bleed  HPI: Paul Morton is a 54 y.o. male  who presented to his primary care provider in early September with complaints of low back pain of one month's duration and neck mass of 3 months duration. CT scan of neck revealed soft tissue masses in the supraclavicular left side and deep to the sternocleidomastoid musculature. He was also noted to have microcytic anemia with a hemoglobin of 9.9 and MCV of 79. Patient is noted to have been a smoker and smokes pack a day. He also drinks 6 beers a day per chart history and has been noted to have elevated liver enzymes. He presented to the emergency room on the 9/25 with worsening low back pain of 3 weeks duration that became quite severe yesterday. He reported that his legs felt heavy and he had the urge to urinate but have been unable to urinate. He was found to be in renal failure with lactic acidosis with a CK=1533 and uric acid=11.2. He is also found to have elevated transaminases and elevated INR. He had an echo performed this morning his EF was approximately 10%. Chest x-ray revealed worsening of alveolar infiltrates. Patient required intubation earlier this morning time of intubation was noted to have coffee ground hematemesis. NG tube was placed and patient had approximately 750 mL of dark fluid that drained. Nurse reports that patient had been noting epigastric discomfort with belching and burping on admission.   Past Medical History  Diagnosis Date  . Allergy   . Alcohol abuse 08/2015    admits to 6 pack beer daily.   . Mass of neck 05/2015    multiple soft tissue masses, palpable and seen on CT.  present since at leat 05/2015.   Marland Kitchen Anemia 08/2015    Past Surgical History  Procedure Laterality Date  . Tonsillectomy      Prior to Admission medications   Medication Sig Start  Date End Date Taking? Authorizing Provider  cyclobenzaprine (FLEXERIL) 10 MG tablet Take 1 tablet (10 mg total) by mouth 3 (three) times daily as needed for muscle spasms. 09/08/15  Yes Roselee Culver, MD  EPINEPHrine 0.3 mg/0.3 mL IJ SOAJ injection Inject 0.3 mLs (0.3 mg total) into the muscle once. 09/08/15  Yes Roselee Culver, MD  esomeprazole (NEXIUM) 20 MG capsule Take 20 mg by mouth daily at 12 noon.   Yes Historical Provider, MD  Multiple Vitamin (MULTIVITAMIN WITH MINERALS) TABS tablet Take 1 tablet by mouth daily.   Yes Historical Provider, MD  naproxen sodium (ANAPROX DS) 550 MG tablet Take 1 tablet (550 mg total) by mouth 2 (two) times daily with a meal. 09/08/15 09/07/16 Yes Roselee Culver, MD  predniSONE (DELTASONE) 20 MG tablet Take 3 PO QAM x3days, 2 PO QAM x3days, 1 PO QAM x3days 09/22/15  Yes Tereasa Coop, PA-C  traMADol (ULTRAM) 50 MG tablet Take 1 tablet (50 mg total) by mouth every 8 (eight) hours as needed. Patient taking differently: Take 50 mg by mouth every 8 (eight) hours as needed for moderate pain.  09/08/15  Yes Roselee Culver, MD    Current Facility-Administered Medications  Medication Dose Route Frequency Provider Last Rate Last Dose  . 0.9 %  sodium chloride infusion   Intravenous Continuous Donita Brooks, NP 10 mL/hr at 09/25/15 0949    . 0.9 %  sodium chloride infusion   Intravenous Once Donita Brooks, NP      . albuterol (PROVENTIL) (2.5 MG/3ML) 0.083% nebulizer solution 2.5 mg  2.5 mg Nebulization Q2H PRN Marijean Heath, NP      . bisacodyl (DULCOLAX) suppository 10 mg  10 mg Rectal Daily PRN Donita Brooks, NP      . dexamethasone (DECADRON) 40 mg in sodium chloride 0.9 % 50 mL IVPB  40 mg Intravenous Q24H Volanda Napoleon, MD      . fentaNYL (SUBLIMAZE) 100 MCG/2ML injection           . fentaNYL (SUBLIMAZE) 2,500 mcg in sodium chloride 0.9 % 250 mL (10 mcg/mL) infusion  25-400 mcg/hr Intravenous Continuous Donita Brooks, NP      . fentaNYL  (SUBLIMAZE) bolus via infusion 50 mcg  50 mcg Intravenous Q1H PRN Donita Brooks, NP      . fentaNYL (SUBLIMAZE) injection 50 mcg  50 mcg Intravenous Once Donita Brooks, NP      . fentaNYL (SUBLIMAZE) injection 50-100 mcg  50-100 mcg Intravenous Q1H PRN Anders Simmonds, MD   50 mcg at 09/25/15 1203  . furosemide (LASIX) 160 mg in dextrose 5 % 50 mL IVPB  160 mg Intravenous 3 times per day Fleet Contras, MD   160 mg at 09/25/15 1240  . lidocaine (cardiac) 100 mg/9ml (XYLOCAINE) 20 MG/ML injection 2%           . midazolam (VERSED) injection 2 mg  2 mg Intravenous Q15 min PRN Donita Brooks, NP      . midazolam (VERSED) injection 2 mg  2 mg Intravenous Q2H PRN Donita Brooks, NP      . milrinone (PRIMACOR) 20 MG/100ML (0.2 mg/mL) infusion  0.25 mcg/kg/min Intravenous Continuous Donita Brooks, NP 6.7 mL/hr at 09/25/15 1128 0.25 mcg/kg/min at 09/25/15 1128  . norepinephrine (LEVOPHED) 4 mg in dextrose 5 % 250 mL (0.016 mg/mL) infusion  0-40 mcg/min Intravenous Titrated Donita Brooks, NP 7.5 mL/hr at 09/25/15 1222 2 mcg/min at 09/25/15 1222  . ondansetron (ZOFRAN) injection 4 mg  4 mg Intravenous Q6H PRN Marijean Heath, NP      . pantoprazole (PROTONIX) injection 40 mg  40 mg Intravenous QHS Marijean Heath, NP   40 mg at 09/06/2015 2240  . phytonadione (VITAMIN K) 10 mg in dextrose 5 % 50 mL IVPB  10 mg Intravenous Once Donita Brooks, NP      . piperacillin-tazobactam (ZOSYN) IVPB 3.375 g  3.375 g Intravenous Q8H Emiliano Dyer, RPH   3.375 g at 09/25/15 0400  . sennosides (SENOKOT) 8.8 MG/5ML syrup 5 mL  5 mL Per Tube BID PRN Donita Brooks, NP      . sodium bicarbonate 150 mEq in dextrose 5 % 1,000 mL infusion   Intravenous Continuous Brand Males, MD 50 mL/hr at 09/25/15 0809    . succinylcholine (ANECTINE) 20 MG/ML injection            Facility-Administered Medications Ordered in Other Encounters  Medication Dose Route Frequency Provider Last Rate Last Dose  .  fludeoxyglucose F - 18 (FDG) injection 9.1 milli Curie  9.1 milli Curie Intravenous Once PRN Medication Radiologist, MD   9.1 milli Curie at 09/21/15 7062    Allergies as of 09/11/2015 - Review Complete 09/06/2015  Allergen Reaction Noted  . Peanut-containing drug products Anaphylaxis, Shortness Of Breath, and Nausea And Vomiting 04/20/2014    History reviewed.  No pertinent family history.  Social History   Social History  . Marital Status: Married    Spouse Name: N/A  . Number of Children: N/A  . Years of Education: N/A   Occupational History  . Not on file.   Social History Main Topics  . Smoking status: Current Every Day Smoker -- 1.00 packs/day    Types: Cigarettes  . Smokeless tobacco: Never Used  . Alcohol Use: 0.0 oz/week    0 Standard drinks or equivalent per week  . Drug Use: No  . Sexual Activity: Not Currently   Other Topics Concern  . Not on file   Social History Narrative    Review of Systems: Unable to obtain, patient intubated.  Physical Exam: Vital signs in last 24 hours: Temp:  [95.2 F (35.1 C)-97.7 F (36.5 C)] 97.3 F (36.3 C) (09/26 1100) Pulse Rate:  [102-117] 110 (09/26 1100) Resp:  [8-34] 18 (09/26 1100) BP: (100-143)/(47-92) 114/76 mmHg (09/26 1100) SpO2:  [92 %-100 %] 99 % (09/26 1100) FiO2 (%):  [100 %] 100 % (09/26 1237) Weight:  [195 lb 8.8 oz (88.7 kg)] 195 lb 8.8 oz (88.7 kg) (09/26 0426)   General:   Sedated, intubated Head:  Normocephalic and atraumatic. Eyes:  Sclera icteric  Conjunctiva pink. Ears:  Normal auditory acuity. Nose:  No deformity, discharge,  or lesions. Mouth:  No deformity or lesions.   Neck: cervical adenopathy left side of neck Lungs:  Bibasilar crackles  Heart:  Regular rate and rhythm. Abdomen:  Soft,nontender, BS active,nonpalp mass or hsm.   Rectal:  Deferred  Msk:  Symmetrical without gross deformities. . Pulses:  Normal pulses noted. Extremities: mottling of LE  noted Neurologic:sedated  Intake/Output from previous day: 09/25 0701 - 09/26 0700 In: 2031.3 [I.V.:931.3; IV Piggyback:1100] Out: 150 [Urine:150] Intake/Output this shift: Total I/O In: 106.3 [I.V.:106.3] Out: 75 [Urine:75]  Lab Results:  Recent Labs  09/06/2015 1632 09/22/2015 1656 09/25/15 0400  WBC 18.6*  --  24.8*  HGB 11.8* 15.0 10.9*  HCT 38.9* 44.0 35.9*  PLT 383  --  335   BMET  Recent Labs  09/09/2015 1632 09/23/2015 1656 09/22/2015 2237 09/25/15 0400  NA 136 132* 137 139  K 6.4* 6.0* 5.8* 6.2*  CL 97* 102 103 103  CO2 10*  --  13* 14*  GLUCOSE 65 58* 114* 145*  BUN 89* 80* 92* 96*  CREATININE 3.43* 3.50* 3.19* 3.47*  CALCIUM 8.5*  --  7.6* 7.3*   LFT  Recent Labs  09/25/15 0400  PROT 5.8*  ALBUMIN 2.3*  AST 701*  ALT 345*  ALKPHOS 230*  BILITOT 4.3*  BILIDIR 3.1*  IBILI 1.2*   PT/INR  Recent Labs  09/10/2015 1630 09/25/15 0800  LABPROT 26.6* 27.6*  INR 2.49* 2.61*    Studies/Results: Ct Abdomen Pelvis Wo Contrast  09/03/2015   CLINICAL DATA:  Recent diagnosis of lymphoma with acute renal failure and sepsis and back pain  EXAM: CT ABDOMEN AND PELVIS WITHOUT CONTRAST  TECHNIQUE: Multidetector CT imaging of the abdomen and pelvis was performed following the standard protocol without IV contrast.  COMPARISON:  PET-CT from 09/21/2015  FINDINGS: Lung bases again demonstrate a moderate right-sided pleural effusion. Mild infiltrative changes are noted in the lower lobes bilaterally greater on the right than the left. These changes have increased in the interval from the recent PET-CT.  The liver is homogeneous in attenuation with the exception of a left hepatic cysts. The spleen is within normal limits. The  adrenal glands, pancreas and gallbladder are unremarkable. The kidneys demonstrate no obstructive changes. No renal calculi are seen. Mild vascular calcifications are noted in the left kidney.  Diverticular change of the colon is seen. No diverticulitis is  noted. A small amount of free pelvic fluid is noted as well as a small amount of fluid surrounding the liver these are similar to that seen on recent PET-CT. The appendix is within normal limits.  The bladder is decompressed by Foley catheter. The osseous structures show no acute abnormality. Degenerative changes are seen. Some mild changes of anasarca are noted.  IMPRESSION: Mild free fluid within the peritoneal cavity similar to that seen on recent PET-CT.  Increase in the degree of bibasilar infiltrate right greater than left with associated right-sided pleural effusion when compared with the prior PET-CT.  Diverticulosis without diverticulitis.   Electronically Signed   By: Inez Catalina M.D.   On: 09/08/2015 21:19   Dg Chest 1 View  09/02/2015   CLINICAL DATA:  Shortness of breath today. Cervical lymphadenopathy. Central line placement. Initial encounter.  EXAM: CHEST 1 VIEW  COMPARISON:  PET-CT 09/21/2015  FINDINGS: 1831 hours. Right IJ central venous catheter tip is at the lower SVC level. No evidence of pneumothorax. Cardiomegaly appears unchanged. There is worsening aeration of the lungs with probable mild pulmonary edema. There is probable layering of the pleural effusions demonstrated on recent PET-CT. The bones appear unchanged.  IMPRESSION: Central line placement as described, tip at the lower SVC level. No pneumothorax. Cardiomegaly with edema and bilateral pleural effusions.   Electronically Signed   By: Richardean Sale M.D.   On: 09/17/2015 18:38   Mr Thoracic Spine Wo Contrast  09/10/2015   CLINICAL DATA:  Leg heaviness. Back injury 3 weeks ago. Worsening pain today. Unable to urinate. Pain radiates from the back all the way around to the abdomen.  EXAM: MRI THORACIC SPINE WITHOUT CONTRAST; MRI CERVICAL SPINE WITHOUT CONTRAST  TECHNIQUE: Multiplanar and multiecho pulse sequences of the thoracic spine were obtained without intravenous contrast.; Multiplanar and multiecho pulse sequences of the  cervical spine, to include the craniocervical junction and cervicothoracic junction, were obtained according to standard protocol without intravenous contrast.  COMPARISON:  PET-CT 09/21/2015.  Soft tissue neck CT 09/08/2015.  FINDINGS: The study was ordered as a metastasis screening protocol of the total spine. However, after sagittal T1 and sagittal STIR images were obtained of the cervical and upper thoracic spine, the examination was discontinued due to decreased oxygen saturation and critical lab values and the patient was returned to the emergency department.  Obtained sagittal images extend from the craniocervical junction to the T9 level. Mild motion artifact is present through the cervical spine.  There is reversal of the normal cervical lordosis. Visualized portion of the thoracic spine demonstrates normal alignment. Mild-to-moderate multilevel disc degeneration is present in the mid and lower cervical spine with predominantly type 2 degenerative endplate marrow changes present, most notably at C6-7. Shallow disc protrusions/disc bulging and endplate spurring are present in the cervical spine, however there is no evidence of significant cervical spinal canal stenosis. A tiny disc protrusion is present at T5-6 without spinal stenosis seen in the visualized portion of the thoracic spine.  The visualized spinal cord is normal in caliber without definite signal abnormality identified, although motion limits evaluation in the cervical spine. There is no cord compression. Bulky left-sided cervical lymphadenopathy is more fully evaluated on prior neck CT.  IMPRESSION: Incomplete examination as above. Mild-to-moderate cervical disc  degeneration. No evidence of significant spinal stenosis or cord compression in the cervical or visualized thoracic spine.   Electronically Signed   By: Logan Bores M.D.   On: 09/15/2015 18:27   Dg Chest Port 1 View  09/25/2015   CLINICAL DATA:  Respiratory failure, sepsis, current  smoker.  EXAM: PORTABLE CHEST 1 VIEW  COMPARISON:  Portable chest x-ray of September 24, 2015  FINDINGS: The lungs are adequately inflated. Confluent alveolar opacities have progressed in the upper lobes and in the right infrahilar region. The cardiopericardial silhouette remains enlarged. The central pulmonary vascularity is prominent. The right internal jugular venous catheter tip projects over the midportion of the SVC. There is no significant pleural effusion. The bony thorax exhibits no acute abnormality.  IMPRESSION: Worsening of alveolar infiltrates bilaterally consistent with pneumonia or pulmonary edema.   Electronically Signed   By: David  Martinique M.D.   On: 09/25/2015 07:36   Mr C Spine Ltd W/o Cm  09/11/2015   CLINICAL DATA:  Leg heaviness. Back injury 3 weeks ago. Worsening pain today. Unable to urinate. Pain radiates from the back all the way around to the abdomen.  EXAM: MRI THORACIC SPINE WITHOUT CONTRAST; MRI CERVICAL SPINE WITHOUT CONTRAST  TECHNIQUE: Multiplanar and multiecho pulse sequences of the thoracic spine were obtained without intravenous contrast.; Multiplanar and multiecho pulse sequences of the cervical spine, to include the craniocervical junction and cervicothoracic junction, were obtained according to standard protocol without intravenous contrast.  COMPARISON:  PET-CT 09/21/2015.  Soft tissue neck CT 09/08/2015.  FINDINGS: The study was ordered as a metastasis screening protocol of the total spine. However, after sagittal T1 and sagittal STIR images were obtained of the cervical and upper thoracic spine, the examination was discontinued due to decreased oxygen saturation and critical lab values and the patient was returned to the emergency department.  Obtained sagittal images extend from the craniocervical junction to the T9 level. Mild motion artifact is present through the cervical spine.  There is reversal of the normal cervical lordosis. Visualized portion of the thoracic  spine demonstrates normal alignment. Mild-to-moderate multilevel disc degeneration is present in the mid and lower cervical spine with predominantly type 2 degenerative endplate marrow changes present, most notably at C6-7. Shallow disc protrusions/disc bulging and endplate spurring are present in the cervical spine, however there is no evidence of significant cervical spinal canal stenosis. A tiny disc protrusion is present at T5-6 without spinal stenosis seen in the visualized portion of the thoracic spine.  The visualized spinal cord is normal in caliber without definite signal abnormality identified, although motion limits evaluation in the cervical spine. There is no cord compression. Bulky left-sided cervical lymphadenopathy is more fully evaluated on prior neck CT.  IMPRESSION: Incomplete examination as above. Mild-to-moderate cervical disc degeneration. No evidence of significant spinal stenosis or cord compression in the cervical or visualized thoracic spine.   Electronically Signed   By: Logan Bores M.D.   On: 09/04/2015 18:27    IMPRESSION/PLAN:   54 year old gentleman with lymphoma admitted yesterday with lactic acidosis, AKI, shock, and cardiomyopathy. Renal failure likely multifactorial. Patient to be transferred to [facility] for a CVVHD, as well as for management of his cardiomyopathy on the advanced heart failure service. Patient has been intubated due to acidosis and respiratory distress. Patient was noted to have large volume hematemesis this morning. INR 2.61. Has received FFP and vitamin K. Will keep on PPI infusion. Will tentatively plan on EGD at bedside tomorrow to evaluate for esophagitis,  gastritis, ulcer etc (providing cardiopulmonary status stabilized). Abnormal LFTs likely multifactorial as well but may be exacerbated by possible ischemia. Will follow.   Hvozdovic, Deloris Ping 09/25/2015,  Pager [phone]     Attending physician's note   I have taken a history, examined the  patient and reviewed the chart. I agree with the Advanced Practitioner's note, impression and recommendations. Shock, acidosis, AKI cardiomyopathy and lymphoma. Developed UGI bleeding today in setting of coagulopathy. R/O ulcer, gastritis, MW tear. EGD after coags corrected. Received Vit K and FFP today. IV PPI infusion, NPO, NG suction for now. Elevated LFTs likely due to shock, hepatic congestion. Transferred from [facility] to [facility] today for CVVHD and heart failure service assistance. Monitor coags, CBC. Tentatively scheduled for EGD tomorrow morning in ICU.   Ladene Artist, MD FACG [phone] Mon-Fri 8a-5p [phone] Mon-Fri 5p-8a, weekends, holidays or per [facility]

## 2015-09-25 NOTE — Consult Note (Signed)
Paul Morton is an 54 y.o. male referred by Dr Chase Caller   Chief Complaint: ARF. Hyperkalemia, met acidosis HPI: 54yo WM with recent findings of large cervical adenopathy and back pain x 1 month admitted worsening back pain and found to have ARF and severe acidosis.  No known hx renal dysfunction and Scr was 1.2 on 09/12/15 but 3.5 yeast and 3.4 today.  PO intake has been poor and has been taking aleve 2 q d for 2 years.  Echo shows EF 10-15%.  Ck 1500 with high PO4 and low Ca.   K 6.4 yest and 6.2 today.  Past Medical History  Diagnosis Date  . Allergy     Past Surgical History  Procedure Laterality Date  . Tonsillectomy      History reviewed. No pertinent family history.  No FH renal failure Social History:  reports that he has been smoking Cigarettes.  He has been smoking about 1.00 pack per day. He has never used smokeless tobacco. He reports that he drinks alcohol. He reports that he does not use illicit drugs. Married, wife is a Marine scientist.    Allergies:  Allergies  Allergen Reactions  . Peanut-Containing Drug Products Anaphylaxis, Shortness Of Breath and Nausea And Vomiting    Medications Prior to Admission  Medication Sig Dispense Refill  . cyclobenzaprine (FLEXERIL) 10 MG tablet Take 1 tablet (10 mg total) by mouth 3 (three) times daily as needed for muscle spasms. 30 tablet 0  . EPINEPHrine 0.3 mg/0.3 mL IJ SOAJ injection Inject 0.3 mLs (0.3 mg total) into the muscle once. 2 Device 2  . esomeprazole (NEXIUM) 20 MG capsule Take 20 mg by mouth daily at 12 noon.    . Multiple Vitamin (MULTIVITAMIN WITH MINERALS) TABS tablet Take 1 tablet by mouth daily.    . naproxen sodium (ANAPROX DS) 550 MG tablet Take 1 tablet (550 mg total) by mouth 2 (two) times daily with a meal. 40 tablet 0  . predniSONE (DELTASONE) 20 MG tablet Take 3 PO QAM x3days, 2 PO QAM x3days, 1 PO QAM x3days 18 tablet 0  . traMADol (ULTRAM) 50 MG tablet Take 1 tablet (50 mg total) by mouth every 8 (eight) hours as  needed. (Patient taking differently: Take 50 mg by mouth every 8 (eight) hours as needed for moderate pain. ) 40 tablet 0     Lab Results: UA: >300 protein, 3-6 wbc, 3-6 rbc, + Hg   Recent Labs  09/27/2015 1632 09/21/2015 1656 09/25/15 0400  WBC 18.6*  --  24.8*  HGB 11.8* 15.0 10.9*  HCT 38.9* 44.0 35.9*  PLT 383  --  335   BMET  Recent Labs  09/29/2015 1632 09/02/2015 1656 09/08/2015 1935 09/16/2015 2237 09/25/15 0400  NA 136 132*  --  137 139  K 6.4* 6.0*  --  5.8* 6.2*  CL 97* 102  --  103 103  CO2 10*  --   --  13* 14*  GLUCOSE 65 58*  --  114* 145*  BUN 89* 80*  --  92* 96*  CREATININE 3.43* 3.50*  --  3.19* 3.47*  CALCIUM 8.5*  --   --  7.6* 7.3*  PHOS  --   --  11.0*  --  11.1*   LFT  Recent Labs  09/25/15 0400  PROT 5.8*  ALBUMIN 2.3*  AST 701*  ALT 345*  ALKPHOS 230*  BILITOT 4.3*  BILIDIR 3.1*  IBILI 1.2*   Ct Abdomen Pelvis Wo Contrast  09/22/2015  CLINICAL DATA:  Recent diagnosis of lymphoma with acute renal failure and sepsis and back pain  EXAM: CT ABDOMEN AND PELVIS WITHOUT CONTRAST  TECHNIQUE: Multidetector CT imaging of the abdomen and pelvis was performed following the standard protocol without IV contrast.  COMPARISON:  PET-CT from 09/21/2015  FINDINGS: Lung bases again demonstrate a moderate right-sided pleural effusion. Mild infiltrative changes are noted in the lower lobes bilaterally greater on the right than the left. These changes have increased in the interval from the recent PET-CT.  The liver is homogeneous in attenuation with the exception of a left hepatic cysts. The spleen is within normal limits. The adrenal glands, pancreas and gallbladder are unremarkable. The kidneys demonstrate no obstructive changes. No renal calculi are seen. Mild vascular calcifications are noted in the left kidney.  Diverticular change of the colon is seen. No diverticulitis is noted. A small amount of free pelvic fluid is noted as well as a small amount of fluid  surrounding the liver these are similar to that seen on recent PET-CT. The appendix is within normal limits.  The bladder is decompressed by Foley catheter. The osseous structures show no acute abnormality. Degenerative changes are seen. Some mild changes of anasarca are noted.  IMPRESSION: Mild free fluid within the peritoneal cavity similar to that seen on recent PET-CT.  Increase in the degree of bibasilar infiltrate right greater than left with associated right-sided pleural effusion when compared with the prior PET-CT.  Diverticulosis without diverticulitis.   Electronically Signed   By: Inez Catalina M.D.   On: 09/05/2015 21:19   Dg Chest 1 View  09/22/2015   CLINICAL DATA:  Shortness of breath today. Cervical lymphadenopathy. Central line placement. Initial encounter.  EXAM: CHEST 1 VIEW  COMPARISON:  PET-CT 09/21/2015  FINDINGS: 1831 hours. Right IJ central venous catheter tip is at the lower SVC level. No evidence of pneumothorax. Cardiomegaly appears unchanged. There is worsening aeration of the lungs with probable mild pulmonary edema. There is probable layering of the pleural effusions demonstrated on recent PET-CT. The bones appear unchanged.  IMPRESSION: Central line placement as described, tip at the lower SVC level. No pneumothorax. Cardiomegaly with edema and bilateral pleural effusions.   Electronically Signed   By: Richardean Sale M.D.   On: 09/11/2015 18:38   Mr Thoracic Spine Wo Contrast  09/13/2015   CLINICAL DATA:  Leg heaviness. Back injury 3 weeks ago. Worsening pain today. Unable to urinate. Pain radiates from the back all the way around to the abdomen.  EXAM: MRI THORACIC SPINE WITHOUT CONTRAST; MRI CERVICAL SPINE WITHOUT CONTRAST  TECHNIQUE: Multiplanar and multiecho pulse sequences of the thoracic spine were obtained without intravenous contrast.; Multiplanar and multiecho pulse sequences of the cervical spine, to include the craniocervical junction and cervicothoracic junction,  were obtained according to standard protocol without intravenous contrast.  COMPARISON:  PET-CT 09/21/2015.  Soft tissue neck CT 09/08/2015.  FINDINGS: The study was ordered as a metastasis screening protocol of the total spine. However, after sagittal T1 and sagittal STIR images were obtained of the cervical and upper thoracic spine, the examination was discontinued due to decreased oxygen saturation and critical lab values and the patient was returned to the emergency department.  Obtained sagittal images extend from the craniocervical junction to the T9 level. Mild motion artifact is present through the cervical spine.  There is reversal of the normal cervical lordosis. Visualized portion of the thoracic spine demonstrates normal alignment. Mild-to-moderate multilevel disc degeneration is present in the  mid and lower cervical spine with predominantly type 2 degenerative endplate marrow changes present, most notably at C6-7. Shallow disc protrusions/disc bulging and endplate spurring are present in the cervical spine, however there is no evidence of significant cervical spinal canal stenosis. A tiny disc protrusion is present at T5-6 without spinal stenosis seen in the visualized portion of the thoracic spine.  The visualized spinal cord is normal in caliber without definite signal abnormality identified, although motion limits evaluation in the cervical spine. There is no cord compression. Bulky left-sided cervical lymphadenopathy is more fully evaluated on prior neck CT.  IMPRESSION: Incomplete examination as above. Mild-to-moderate cervical disc degeneration. No evidence of significant spinal stenosis or cord compression in the cervical or visualized thoracic spine.   Electronically Signed   By: Logan Bores M.D.   On: 09/06/2015 18:27   Dg Chest Port 1 View  09/25/2015   CLINICAL DATA:  Respiratory failure, sepsis, current smoker.  EXAM: PORTABLE CHEST 1 VIEW  COMPARISON:  Portable chest x-ray of September 24, 2015  FINDINGS: The lungs are adequately inflated. Confluent alveolar opacities have progressed in the upper lobes and in the right infrahilar region. The cardiopericardial silhouette remains enlarged. The central pulmonary vascularity is prominent. The right internal jugular venous catheter tip projects over the midportion of the SVC. There is no significant pleural effusion. The bony thorax exhibits no acute abnormality.  IMPRESSION: Worsening of alveolar infiltrates bilaterally consistent with pneumonia or pulmonary edema.   Electronically Signed   By: David  Martinique M.D.   On: 09/25/2015 07:36   Mr C Spine Ltd W/o Cm  09/13/2015   CLINICAL DATA:  Leg heaviness. Back injury 3 weeks ago. Worsening pain today. Unable to urinate. Pain radiates from the back all the way around to the abdomen.  EXAM: MRI THORACIC SPINE WITHOUT CONTRAST; MRI CERVICAL SPINE WITHOUT CONTRAST  TECHNIQUE: Multiplanar and multiecho pulse sequences of the thoracic spine were obtained without intravenous contrast.; Multiplanar and multiecho pulse sequences of the cervical spine, to include the craniocervical junction and cervicothoracic junction, were obtained according to standard protocol without intravenous contrast.  COMPARISON:  PET-CT 09/21/2015.  Soft tissue neck CT 09/08/2015.  FINDINGS: The study was ordered as a metastasis screening protocol of the total spine. However, after sagittal T1 and sagittal STIR images were obtained of the cervical and upper thoracic spine, the examination was discontinued due to decreased oxygen saturation and critical lab values and the patient was returned to the emergency department.  Obtained sagittal images extend from the craniocervical junction to the T9 level. Mild motion artifact is present through the cervical spine.  There is reversal of the normal cervical lordosis. Visualized portion of the thoracic spine demonstrates normal alignment. Mild-to-moderate multilevel disc degeneration is  present in the mid and lower cervical spine with predominantly type 2 degenerative endplate marrow changes present, most notably at C6-7. Shallow disc protrusions/disc bulging and endplate spurring are present in the cervical spine, however there is no evidence of significant cervical spinal canal stenosis. A tiny disc protrusion is present at T5-6 without spinal stenosis seen in the visualized portion of the thoracic spine.  The visualized spinal cord is normal in caliber without definite signal abnormality identified, although motion limits evaluation in the cervical spine. There is no cord compression. Bulky left-sided cervical lymphadenopathy is more fully evaluated on prior neck CT.  IMPRESSION: Incomplete examination as above. Mild-to-moderate cervical disc degeneration. No evidence of significant spinal stenosis or cord compression in the cervical or  visualized thoracic spine.   Electronically Signed   By: Logan Bores M.D.   On: 09/17/2015 18:27    ROS: No change in vision No CP + Abd pain + back pain + edema Chronic pain in knees  PHYSICAL EXAM: Blood pressure 113/76, pulse 108, temperature 97.2 F (36.2 C), temperature source Core (Comment), resp. rate 14, height 6' (1.829 m), weight 88.7 kg (195 lb 8.8 oz), SpO2 98 %. HEENT: PERRLA EOMI NECK:Rt triple lumen cath.  Large LN lt submandibular/cervical region LUNGS: Decreased BS bases with few crackles CARDIAC: Tachy, reg No MRG ABD:+ bs + tenderness RUQ, No HSM Soft, no rebound, + SQ edema of flanks and presacral region EXT: 1+ edema, toes mildly cyanotic NEURO:CNI, Ox3, No asterixis  Assessment: 1. ARF that is most likely multi factorial in origin and related to volume depletion, NSAID's, poor CO +/- rhabdo 2. Met acidosis 3. Hyperkalemia 4. 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 CM EF 10-50% PLAN: 1. Cont IV bicarb 2. Try to stimulate UO with IV lasix and milrinone 3. Correction of acidosis should help with hyperkalemia as will  increasing UO.  If UO does not improve or if K increases then will plan CVVHD sooner. Otherwise will give 24hr to see if renal fx can turn around.  All this was discussed with pt and wife.  There are reversible factors contributing to his ARF (except maybe the poor EF) and thus good chance renal fx could recover. 4. Recheck K later today 5. Daily labs 6. Note plan for intubation and transfer to [facility]   MATTINGLY,MICHAEL T 09/25/2015, 11:08 AM

## 2015-09-25 NOTE — Consult Note (Addendum)
Advanced Heart Failure Team Consult Note  Referring Physician: Dr Vaughan Browner Primary Physician: Primary Cardiologist:  None Oncologist: Dr Marin Olp  Reason for Consultation: Acute Systolic Heart Failure   HPI:    Paul Morton is a 54 year old male with h/o tobacco and ETOH abuse who recently developed large cervical adenopathy and intractable back pain. Underwent PET scan earlier this month which showed hypermetabolic left cervical lymphadenopathy involving levels II-IV and presence of hepatomegaly with diffuse liver hypermetabolism suggesting probably lymphoma.   Over the past 24 hours he developed profound fatigue and SOB. He presented to [facility] today with with tachycardia, lactate 14 and multisystem organ failure. Echo performed and EF 10-15% with moderate RV dysfunction. Initial co-ox drawn was 55%. Started on milrinone. Intubated and transferred to [facility]. He is now anuric. Renal has seen and planning CVVHD in am.  He does not have any h/o known heart disease but has a 48 y/o brother with severe HF and systolic dysfunction which has been attributed to viral cardiomyopathy. He smokes daily and drinks 4-5 beers per day according to his wife.    Review of Systems: [y] = yes, [ ]  = no  - per wife  General: Weight gain [ ] ; Weight loss [ ] ; Anorexia [ ] ; Fatigue Blue.Reese ]; Fever [ ] ; Chills [ ] ; Weakness [ ]   Cardiac: Chest pain/pressure [ ] ; Resting SOB [ y]; Exertional SOB [ y]; Orthopnea [ ] ; Pedal Edema [ ] ; Palpitations [ ] ; Syncope [ ] ; Presyncope [ ] ; Paroxysmal nocturnal dyspnea[ ]   Pulmonary: Cough [ ] ; Wheezing[ ] ; Hemoptysis[ ] ; Sputum [ ] ; Snoring [ ]   GI: Vomiting[ ] ; Dysphagia[ ] ; Melena[ ] ; Hematochezia [ ] ; Heartburn[ ] ; Abdominal pain [ ] ; Constipation [ ] ; Diarrhea [ ] ; BRBPR [ ]   GU: Hematuria[ ] ; Dysuria [ ] ; Nocturia[ ]   Vascular: Pain in legs with walking [ ] ; Pain in feet with lying flat [ ] ; Non-healing sores [ ] ; Stroke [ ] ; TIA [ ] ; Slurred speech [ ] ;  Neuro: Headaches[ ] ; Vertigo[  ]; Seizures[ ] ; Paresthesias[ ] ;Blurred vision [ ] ; Diplopia [ ] ; Vision changes [ ]   Ortho/Skin: Arthritis Blue.Reese ]; Joint pain [ ] ; Muscle pain [ ] ; Joint swelling [ ] ; Back Pain Blue.Reese ]; Rash [ ]   Psych: Depression[ ] ; Anxiety[ ]   Heme: Bleeding problems [ ] ; Clotting disorders [ ] ; Anemia [ ]   Endocrine: Diabetes [ ] ; Thyroid dysfunction[ ]   Home Medications Prior to Admission medications   Medication Sig Start Date End Date Taking? Authorizing Provider  cyclobenzaprine (FLEXERIL) 10 MG tablet Take 1 tablet (10 mg total) by mouth 3 (three) times daily as needed for muscle spasms. 09/08/15  Yes Roselee Culver, MD  EPINEPHrine 0.3 mg/0.3 mL IJ SOAJ injection Inject 0.3 mLs (0.3 mg total) into the muscle once. 09/08/15  Yes Roselee Culver, MD  esomeprazole (NEXIUM) 20 MG capsule Take 20 mg by mouth daily at 12 noon.   Yes Historical Provider, MD  Multiple Vitamin (MULTIVITAMIN WITH MINERALS) TABS tablet Take 1 tablet by mouth daily.   Yes Historical Provider, MD  naproxen sodium (ANAPROX DS) 550 MG tablet Take 1 tablet (550 mg total) by mouth 2 (two) times daily with a meal. 09/08/15 09/07/16 Yes Roselee Culver, MD  predniSONE (DELTASONE) 20 MG tablet Take 3 PO QAM x3days, 2 PO QAM x3days, 1 PO QAM x3days 09/22/15  Yes Tereasa Coop, PA-C  traMADol (ULTRAM) 50 MG tablet Take 1 tablet (50 mg total) by mouth  every 8 (eight) hours as needed. Patient taking differently: Take 50 mg by mouth every 8 (eight) hours as needed for moderate pain.  09/08/15  Yes Roselee Culver, MD    Past Medical History: Past Medical History  Diagnosis Date  . Alcohol abuse 08/2015    admits to 6 pack beer daily.   . Mass of neck 05/2015    multiple soft tissue masses, palpable and seen on CT.  present since at leat 05/2015.   Marland Kitchen Anemia 08/2015  . Elevated LFTs 08/2015  . Herpes zoster 03/2014    right C3-4, T1-2 distribution.   . Gynecomastia 08/2015    per PET scan  . Opacity of lung on imaging study 08/2015     bilateral, with effusion and mild ephysema per PET scan  . Diverticulosis of colon 08/2015    per PET scan  . Hepatomegaly 08/2015    and 1.6 cm liver cyst per PET.   Marland Kitchen Ascites 08/2015    small abd ascites and mild anasarca per PET  . CHF (congestive heart failure) 08/2015    Severe dilatation of all of the heart chambers. EF 10 to 15%.  Severe LV and moderate RV systolic dysfunction. Restrictive pattern of diastolic dysfunction. Moderate aortic, mitral, pulmonic and tricuspid regurgitation. Mild to moderate pulmonary hypertension  . AKI (acute kidney injury) 08/2015  . DJD (degenerative joint disease) of cervical spine 08/2015    per MRI    Past Surgical History: Past Surgical History  Procedure Laterality Date  . Tonsillectomy      Family History: Family History  Problem Relation Age of Onset  . Lung cancer Father     Social History: Social History   Social History  . Marital Status: Married    Spouse Name: N/A  . Number of Children: N/A  . Years of Education: N/A   Social History Main Topics  . Smoking status: Current Every Day Smoker -- 1.00 packs/day    Types: Cigarettes  . Smokeless tobacco: Never Used  . Alcohol Use: 0.0 oz/week    0 Standard drinks or equivalent per week  . Drug Use: No  . Sexual Activity: Not Currently   Other Topics Concern  . 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 Peanut-Containing Drug Products Anaphylaxis, Shortness Of Breath and Nausea And Vomiting    Objective:    Vital Signs:   Temp:  [95.2 F (35.1 C)-97.7 F (36.5 C)] 96.3 F (35.7 C) (09/26 1515) Pulse Rate:  [102-117] 109 (09/26 1515) Resp:  [8-34] 15 (09/26 1515) BP: (100-143)/(56-92) 108/63 mmHg (09/26 1515) SpO2:  [92 %-100 %] 96 % (09/26 1515) FiO2 (%):  [40 %-100 %] 40 % (09/26 1500) Weight:  [195 lb 8.8 oz (88.7 kg)] 195 lb 8.8 oz (88.7 kg) (09/26 0426)    Weight  change: Filed Weights   09/19/2015 2100 09/25/15 0426  Weight: 195 lb 8.8 oz (88.7 kg) 195 lb 8.8 oz (88.7 kg)    Intake/Output:   Intake/Output Summary (Last 24 hours) at 09/25/15 1534 Last data filed at 09/25/15 1349  Gross per 24 hour  Intake 2288.52 ml  Output   1350 ml  Net 938.52 ml     Physical Exam: General:  Intubated sedated HEENT: normal except for ETT Neck: supple. RIJ TLC . Prominent left neck lymphadenopathy Cor: PMI laterally displaced. Tachy regular + s3 Lungs: + rhonchi Abdomen:  soft, nontender, nondistended. No bruits or masses. Good bowel sounds. Extremities: no cyanosis, clubbing, rash, tr edema Neuro: intubated sedated  Telemetry: sinus tach 100-115  Labs: Basic Metabolic Panel:  Recent Labs Lab 09/22/2015 1632 09/16/2015 1656 09/16/2015 1935 09/15/2015 2237 09/25/15 0400  NA 136 132*  --  137 139  K 6.4* 6.0*  --  5.8* 6.2*  CL 97* 102  --  103 103  CO2 10*  --   --  13* 14*  GLUCOSE 65 58*  --  114* 145*  BUN 89* 80*  --  92* 96*  CREATININE 3.43* 3.50*  --  3.19* 3.47*  CALCIUM 8.5*  --   --  7.6* 7.3*  MG  --   --  2.9*  --  2.8*  PHOS  --   --  11.0*  --  11.1*    Liver Function Tests:  Recent Labs Lab 09/21/2015 1632 09/25/15 0400  AST 389* 701*  ALT 330* 345*  ALKPHOS 266* 230*  BILITOT 4.1* 4.3*  PROT 6.8 5.8*  ALBUMIN 2.6* 2.3*   No results for input(s): LIPASE, AMYLASE in the last 168 hours. No results for input(s): AMMONIA in the last 168 hours.  CBC:  Recent Labs Lab 09/27/2015 1632 09/27/2015 1656 09/25/15 0400 09/25/15 1524  WBC 18.6*  --  24.8* 15.0*  NEUTROABS 16.5*  --   --  PENDING  HGB 11.8* 15.0 10.9* 11.0*  HCT 38.9* 44.0 35.9* 34.9*  MCV 79.2  --  79.6 78.3  PLT 383  --  335 327    Cardiac Enzymes:  Recent Labs Lab 08/31/2015 1630 09/25/15 0400 09/25/15 0800  CKTOTAL 1533* 1111* 1052*  CKMB  --   --  73.5*    BNP: BNP (last 3 results) No results for input(s): BNP in the last 8760 hours.  ProBNP  (last 3 results) No results for input(s): PROBNP in the last 8760 hours.   CBG:  Recent Labs Lab 09/25/15 0257 09/25/15 0623 09/25/15 0757 09/25/15 1045 09/25/15 1206  GLUCAP 118* 110* 122* 117* 113*    Coagulation Studies:  Recent Labs  09/07/2015 1630 09/25/15 0800  LABPROT 26.6* 27.6*  INR 2.49* 2.61*    Other results: EKG: ST 107 RBBB/LAFB  Imaging: Ct Abdomen Pelvis Wo Contrast  09/03/2015   CLINICAL DATA:  Recent diagnosis of lymphoma with acute renal failure and sepsis and back pain  EXAM: CT ABDOMEN AND PELVIS WITHOUT CONTRAST  TECHNIQUE: Multidetector CT imaging of the abdomen and pelvis was performed following the standard protocol without IV contrast.  COMPARISON:  PET-CT from 09/21/2015  FINDINGS: Lung bases again demonstrate a moderate right-sided pleural effusion. Mild infiltrative changes are noted in the lower lobes bilaterally greater on the right than the left. These changes have increased in the interval from the recent PET-CT.  The liver is homogeneous in attenuation with the exception of a left hepatic cysts. The spleen is within normal limits. The adrenal glands, pancreas and gallbladder are unremarkable. The kidneys demonstrate no obstructive changes. No renal calculi are seen. Mild vascular calcifications are noted in the left kidney.  Diverticular change of the colon is seen. No diverticulitis is noted. A small amount of free pelvic fluid is noted as well as a small amount of fluid surrounding the liver these are similar to that seen on recent PET-CT. The appendix is within normal limits.  The bladder is decompressed by Foley catheter. The osseous structures show no acute abnormality. Degenerative changes are seen. Some mild  changes of anasarca are noted.  IMPRESSION: Mild free fluid within the peritoneal cavity similar to that seen on recent PET-CT.  Increase in the degree of bibasilar infiltrate right greater than left with associated right-sided pleural  effusion when compared with the prior PET-CT.  Diverticulosis without diverticulitis.   Electronically Signed   By: Inez Catalina M.D.   On: 09/20/2015 21:19   Dg Chest 1 View  09/26/2015   CLINICAL DATA:  Shortness of breath today. Cervical lymphadenopathy. Central line placement. Initial encounter.  EXAM: CHEST 1 VIEW  COMPARISON:  PET-CT 09/21/2015  FINDINGS: 1831 hours. Right IJ central venous catheter tip is at the lower SVC level. No evidence of pneumothorax. Cardiomegaly appears unchanged. There is worsening aeration of the lungs with probable mild pulmonary edema. There is probable layering of the pleural effusions demonstrated on recent PET-CT. The bones appear unchanged.  IMPRESSION: Central line placement as described, tip at the lower SVC level. No pneumothorax. Cardiomegaly with edema and bilateral pleural effusions.   Electronically Signed   By: Richardean Sale M.D.   On: 09/18/2015 18:38   Paul Thoracic Spine Wo Contrast  09/23/2015   CLINICAL DATA:  Leg heaviness. Back injury 3 weeks ago. Worsening pain today. Unable to urinate. Pain radiates from the back all the way around to the abdomen.  EXAM: MRI THORACIC SPINE WITHOUT CONTRAST; MRI CERVICAL SPINE WITHOUT CONTRAST  TECHNIQUE: Multiplanar and multiecho pulse sequences of the thoracic spine were obtained without intravenous contrast.; Multiplanar and multiecho pulse sequences of the cervical spine, to include the craniocervical junction and cervicothoracic junction, were obtained according to standard protocol without intravenous contrast.  COMPARISON:  PET-CT 09/21/2015.  Soft tissue neck CT 09/08/2015.  FINDINGS: The study was ordered as a metastasis screening protocol of the total spine. However, after sagittal T1 and sagittal STIR images were obtained of the cervical and upper thoracic spine, the examination was discontinued due to decreased oxygen saturation and critical lab values and the patient was returned to the emergency department.   Obtained sagittal images extend from the craniocervical junction to the T9 level. Mild motion artifact is present through the cervical spine.  There is reversal of the normal cervical lordosis. Visualized portion of the thoracic spine demonstrates normal alignment. Mild-to-moderate multilevel disc degeneration is present in the mid and lower cervical spine with predominantly type 2 degenerative endplate marrow changes present, most notably at C6-7. Shallow disc protrusions/disc bulging and endplate spurring are present in the cervical spine, however there is no evidence of significant cervical spinal canal stenosis. A tiny disc protrusion is present at T5-6 without spinal stenosis seen in the visualized portion of the thoracic spine.  The visualized spinal cord is normal in caliber without definite signal abnormality identified, although motion limits evaluation in the cervical spine. There is no cord compression. Bulky left-sided cervical lymphadenopathy is more fully evaluated on prior neck CT.  IMPRESSION: Incomplete examination as above. Mild-to-moderate cervical disc degeneration. No evidence of significant spinal stenosis or cord compression in the cervical or visualized thoracic spine.   Electronically Signed   By: Logan Bores M.D.   On: 09/11/2015 18:27   Dg Chest Port 1 View  09/25/2015   CLINICAL DATA:  Respiratory insufficiency. Evaluate ET tube placement.  EXAM: PORTABLE CHEST 1 VIEW  COMPARISON:  09/25/2015  FINDINGS: There is a right IJ catheter with tip in the cavoatrial junction. ET tube tip is above the carina. There is a nasogastric tube with tip in the stomach. Cardiac  enlargement noted. Bilateral pulmonary opacities are again noted compatible with edema and/or multifocal infection.  IMPRESSION: 1. Persistent bilateral pulmonary opacities compatible with pneumonia and/or edema.   Electronically Signed   By: Kerby Moors M.D.   On: 09/25/2015 13:31   Dg Chest Port 1 View  09/25/2015    CLINICAL DATA:  Respiratory failure, sepsis, current smoker.  EXAM: PORTABLE CHEST 1 VIEW  COMPARISON:  Portable chest x-ray of September 24, 2015  FINDINGS: The lungs are adequately inflated. Confluent alveolar opacities have progressed in the upper lobes and in the right infrahilar region. The cardiopericardial silhouette remains enlarged. The central pulmonary vascularity is prominent. The right internal jugular venous catheter tip projects over the midportion of the SVC. There is no significant pleural effusion. The bony thorax exhibits no acute abnormality.  IMPRESSION: Worsening of alveolar infiltrates bilaterally consistent with pneumonia or pulmonary edema.   Electronically Signed   By: David  Martinique M.D.   On: 09/25/2015 07:36   [facility] W/o Cm  09/27/2015   CLINICAL DATA:  Leg heaviness. Back injury 3 weeks ago. Worsening pain today. Unable to urinate. Pain radiates from the back all the way around to the abdomen.  EXAM: MRI THORACIC SPINE WITHOUT CONTRAST; MRI CERVICAL SPINE WITHOUT CONTRAST  TECHNIQUE: Multiplanar and multiecho pulse sequences of the thoracic spine were obtained without intravenous contrast.; Multiplanar and multiecho pulse sequences of the cervical spine, to include the craniocervical junction and cervicothoracic junction, were obtained according to standard protocol without intravenous contrast.  COMPARISON:  PET-CT 09/21/2015.  Soft tissue neck CT 09/08/2015.  FINDINGS: The study was ordered as a metastasis screening protocol of the total spine. However, after sagittal T1 and sagittal STIR images were obtained of the cervical and upper thoracic spine, the examination was discontinued due to decreased oxygen saturation and critical lab values and the patient was returned to the emergency department.  Obtained sagittal images extend from the craniocervical junction to the T9 level. Mild motion artifact is present through the cervical spine.  There is reversal of the normal  cervical lordosis. Visualized portion of the thoracic spine demonstrates normal alignment. Mild-to-moderate multilevel disc degeneration is present in the mid and lower cervical spine with predominantly type 2 degenerative endplate marrow changes present, most notably at C6-7. Shallow disc protrusions/disc bulging and endplate spurring are present in the cervical spine, however there is no evidence of significant cervical spinal canal stenosis. A tiny disc protrusion is present at T5-6 without spinal stenosis seen in the visualized portion of the thoracic spine.  The visualized spinal cord is normal in caliber without definite signal abnormality identified, although motion limits evaluation in the cervical spine. There is no cord compression. Bulky left-sided cervical lymphadenopathy is more fully evaluated on prior neck CT.  IMPRESSION: Incomplete examination as above. Mild-to-moderate cervical disc degeneration. No evidence of significant spinal stenosis or cord compression in the cervical or visualized thoracic spine.   Electronically Signed   By: Logan Bores M.D.   On: 09/16/2015 18:27      Medications:     Current Medications: . sodium chloride   Intravenous Once  . dexamethasone (DECADRON) IVPB [facility]  40 mg Intravenous Q24H  . fentaNYL (SUBLIMAZE) injection  50 mcg Intravenous Once  . furosemide  160 mg Intravenous 3 times per day  . lidocaine (cardiac) 100 mg/42ml      . pantoprazole (PROTONIX) IV  40 mg Intravenous QHS  . piperacillin-tazobactam (ZOSYN)  IV  3.375 g Intravenous Q8H  .  succinylcholine         Infusions: . sodium chloride 10 mL/hr at 09/25/15 0949  . fentaNYL infusion INTRAVENOUS 150 mcg/hr (09/25/15 1504)  . milrinone 0.25 mcg/kg/min (09/25/15 1128)  . norepinephrine (LEVOPHED) Adult infusion Stopped (09/25/15 1307)  .  sodium bicarbonate  infusion 1000 mL 50 mL/hr at 09/25/15 0809      Assessment:   1. Cardiogenic shock 2. Acute systolic HF EF 15-94% 3. Acute  respiratory failure 4. Multisystem organ failure 5. Acute kidney injury 6. Shock liver 7. Hyperkalemia 8. 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 Tobacco use 10. ETOH use - 4+ beers per day 11. Family h/o NICM in brother 63. Massive upper GI bleed - suspect gut ischemia 13. Coagulopathy - due t shock liver with ? DIC  Plan/Discussion:    See MD assessment below for full details.  CLEGG,AMY 5/85/9292, 4:46 PM  Complicated case. It appears that he may have underlying lymphoma however the most salient feature of his illness currently appears to be cardiogenic shock with diffuse hypoperfusion/lactic acidosis and multisystem organ failure in the setting of newly discovered profound systolic dysfunction with EF 10-15%. 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 Ferritin likely not high enough to suggest hemochromatosis.    Agree with supportive care with inotropes (milrinone and norepi) to keep his MAP > 60 and MV sat >= 60%. Agree with need for CVVHD for control of volume status, acidosis and electrolyte abnormalities. Once stabilized will need further evaluation of underlying condition/malignancy as well as possible coronary angiography.   We will follow closely. Discussed with family at bedside.   The patient is critically ill with multiple organ systems failure and requires high complexity decision making for assessment and support, frequent evaluation and titration of therapies, application of advanced monitoring technologies and extensive interpretation of multiple databases.   Critical Care Time devoted to patient care services described in this note is 35 Minutes.   Bensimhon, Daniel,MD  Advanced Heart Failure Team Pager [phone] (M-F; 7a - 4p)  Please contact [facility] for night-coverage after hours (4p -7a ) and weekends on [email or web address]

## 2015-09-25 NOTE — Progress Notes (Signed)
CRITICAL VALUE ALERT  Critical value received:  Lactic Acid 8.4  Date of notification:  09/25/15  Time of notification:  0850  Critical value read back: yes  Nurse who received alert:  Mannie Stabile RN  MD notified (1st page): Noe Gens NP Time of first page:    MD notified (2nd page): NP and Attending MD on unit  Time of second page:   Responding MD:  Dr.   Time MD responded: Dr.  Vaughan Browner

## 2015-09-25 NOTE — Progress Notes (Signed)
CRITICAL VALUE ALERT  Critical value received:  Lactic 7.9 K+ 6.2  Date of notification:  09/25/15  Time of notification:  3154  Critical value read back:Yes.    Nurse who received alert:  Darrin Nipper, RN   MD notified (1st page):  Warren Lacy

## 2015-09-25 NOTE — Progress Notes (Signed)
eLink Physician-Brief Progress Note Patient Name: Paul Morton DOB: 10/11/1961 MRN: [id number]   Date of Service  09/25/2015  HPI/Events of Note  Patient in severe pain. No pain medications. BP = 114/79.  eICU Interventions  Will order Fentanyl 50 mcg IV Q 2 hours PRN pain.      Intervention Category Intermediate Interventions: Pain - evaluation and management  Sommer,Steven Eugene 09/25/2015, 3:00 AM

## 2015-09-25 NOTE — Progress Notes (Signed)
Patient ID: Paul Morton, male   DOB: [birth year], 54 y.o.   MRN: [id number] Request received for US guided cervical LN bx on pt. PT/INR today is 27.6/2.6. These values are too high to safely perform bx now per Dr. Willette Alma). Will cont to monitor. Please page Dr. Vernard Gambles at [phone] with any additional questions.

## 2015-09-25 NOTE — Progress Notes (Signed)
RT assisted with intubation- uneventful. RN remains at bedside. RT related documentation on EPIC Flowsheet.

## 2015-09-25 NOTE — Progress Notes (Signed)
Patient brought from [facility], by [facility], and placed on ventilator without complications.  Will continue to monitor.

## 2015-09-25 NOTE — Progress Notes (Signed)
[facility] Progress Note Patient Name: Paul Morton DOB: 1961-07-28 MRN: [id number]   Date of Service  09/25/2015  HPI/Events of Note  K+ = 6.2 and Lactic Acid has improved to 7.9  eICU Interventions  Will order: 1. Kayexalate 30 gm PO X 1 now.      Intervention Category Major Interventions: Electrolyte abnormality - evaluation and management;Acid-Base disturbance - evaluation and management  Frida Wahlstrom Eugene 09/25/2015, 5:02 AM

## 2015-09-25 NOTE — Progress Notes (Signed)
S:  AM follow up rounding -  RN reports ECHO performed and preliminary EF read is ~10%, rising lactic acid to 8.4 from 7.9   O: Blood pressure 107/66, pulse 106, temperature 96.8 F (36 C), temperature source Core (Comment), resp. rate 14, height 6' (1.829 m), weight 195 lb 8.8 oz (88.7 kg), SpO2 98 %.  General:  ill adult male in no distress  Neuro: AAOx4, speech clear, MAE HEENT:  Scleral icterus, mild jaundice, L supraclavicular LN approx size of 2 golf balls CV: s1s2 rrr, ? Gallop, no murmur PULM: resp's even/non-labored, lungs bilaterally with few basilar crackles  GI: NTND, BSx4 active, belching Extremities: cool/dry, LE's mottled     Recent Labs Lab 09/29/2015 1632 09/21/2015 1656 09/25/15 0400  HGB 11.8* 15.0 10.9*  HCT 38.9* 44.0 35.9*  WBC 18.6*  --  24.8*  PLT 383  --  335    Recent Labs Lab 09/23/2015 1632 09/11/2015 1656 09/05/2015 1935 09/28/2015 2237 09/25/15 0400  NA 136 132*  --  137 139  K 6.4* 6.0*  --  5.8* 6.2*  CL 97* 102  --  103 103  CO2 10*  --   --  13* 14*  GLUCOSE 65 58*  --  114* 145*  BUN 89* 80*  --  92* 96*  CREATININE 3.43* 3.50*  --  3.19* 3.47*  CALCIUM 8.5*  --   --  7.6* 7.3*  MG  --   --  2.9*  --  2.8*  PHOS  --   --  11.0*  --  11.1*   Lactic Acid 8.4  CXR 9/26 >> images personally reviewed, worsening of alveolar infiltrates compared to 9/25 film.  Concern for edema (suspected) vs infiltrate   Assessment & Plan:   Suspected Malignancy - no bx for tissues sampling as of 9/26.  Concern for lymphoma vs Hodgkin's Disease.    Plan: Dr. Marin Olp following, appreciate input  Needs biopsy but unsure he could lie still for procedure  Will need colonoscopy when stable  Will need port-a-cath at some point IR evaluation for cervical biopsy of mass Assess MRI of lumbar spine when able    Pulmonary Edema  CHF - preliminary EF on ECHO 7% RLE swelling   Plan: Monitor CXR Oxygen as needed to keep saturations >90% Plan for elective  intubation due to pain control needs, multiple procedures and pulmonary edema  CHF consult, appreciate input.  Would like to have patient transferred to [facility].  Will discuss with Dr. Marin Olp.  Assess SVO2 now, if less than 55% add Milrinone 0.25 mcg/kg/min Levophed for vasopressor if needed  R/O DVT with LE ultrasound  AKI - in setting of acute critical illness, ? Component of tumor lysis syndrome vs cardiorenal syndrome, volume depletion with poor PO intake, rhabo from lying at home + chronic naproxen usage (2 in AM, 1 in PM) Lactic / Metabolic Acidosis - with appropriate respiratory compensation Concern for Rhabdomyolysis - CK 1111  Hyperkalemia  Hyperuricemia - uric acid 11 Hyperphosphatemia   Plan: Trend UOP / BMP Nephrology consult  Follow up BMP, CK @ 2 pm Bicarb gtt (3 amps) at 40ml/hr S/P kayexalate 9/26 S/p rasburicase    Acute Transaminitis with rising INR - elevated LFT's, hypotension, ? Tylenol, shock liver?  Plan: Await acetaminophen level  Follow up LFT's @ 2 PM Administer Vitamin K 10 mg IV x1 Consider FFP for procedure, biopsy is critical at this point    Pain - c/o back pain on presentation,  incomplete MRI performed with negative thoracic spine  Plan: PRN fentanyl for now, will need gtt once extubated   Rule out Sepsis   Plan: Continue vanco / zosyn for now Trend PCT / lactic acid    GLOBAL:  Family updated at bedside.  Patient included in conversation.  Concern for clinical worsening with EF of 10% (per Dr. Haroldine Laws), AKI, in the setting of suspected malignancy.  He likely will need transfer for CHF management but will discuss with ONC to determine if further malignant work up will be possible at [facility].      Noe Gens, NP-C [facility] Pgr: [phone] or if no answer [phone] 09/25/2015, 9:32 AM

## 2015-09-26 ENCOUNTER — Inpatient Hospital Stay (HOSPITAL_COMMUNITY): Payer: BLUE CROSS/BLUE SHIELD

## 2015-09-26 ENCOUNTER — Other Ambulatory Visit: Payer: Self-pay | Admitting: Interventional Radiology

## 2015-09-26 ENCOUNTER — Encounter (HOSPITAL_COMMUNITY): Admission: EM | Disposition: E | Payer: Self-pay | Source: Home / Self Care | Attending: Internal Medicine

## 2015-09-26 ENCOUNTER — Encounter (HOSPITAL_COMMUNITY): Payer: Self-pay

## 2015-09-26 DIAGNOSIS — R570 Cardiogenic shock: Secondary | ICD-10-CM | POA: Insufficient documentation

## 2015-09-26 DIAGNOSIS — N171 Acute kidney failure with acute cortical necrosis: Secondary | ICD-10-CM

## 2015-09-26 DIAGNOSIS — K210 Gastro-esophageal reflux disease with esophagitis: Secondary | ICD-10-CM

## 2015-09-26 DIAGNOSIS — R6521 Severe sepsis with septic shock: Secondary | ICD-10-CM

## 2015-09-26 DIAGNOSIS — K920 Hematemesis: Secondary | ICD-10-CM

## 2015-09-26 DIAGNOSIS — K208 Other esophagitis: Secondary | ICD-10-CM

## 2015-09-26 DIAGNOSIS — R17 Unspecified jaundice: Secondary | ICD-10-CM

## 2015-09-26 DIAGNOSIS — A419 Sepsis, unspecified organism: Secondary | ICD-10-CM | POA: Insufficient documentation

## 2015-09-26 HISTORY — PX: ESOPHAGOGASTRODUODENOSCOPY: SHX5428

## 2015-09-26 HISTORY — DX: Hematemesis: K92.0

## 2015-09-26 LAB — HEPATIC FUNCTION PANEL
ALBUMIN: 1.9 g/dL — AB (ref 3.5–5.0)
ALT: 290 U/L — ABNORMAL HIGH (ref 17–63)
AST: 516 U/L — ABNORMAL HIGH (ref 15–41)
Alkaline Phosphatase: 173 U/L — ABNORMAL HIGH (ref 38–126)
BILIRUBIN TOTAL: 5.8 mg/dL — AB (ref 0.3–1.2)
Bilirubin, Direct: 4 mg/dL — ABNORMAL HIGH (ref 0.1–0.5)
Indirect Bilirubin: 1.8 mg/dL — ABNORMAL HIGH (ref 0.3–0.9)
TOTAL PROTEIN: 5.2 g/dL — AB (ref 6.5–8.1)

## 2015-09-26 LAB — PROTIME-INR
INR: 2.1 — ABNORMAL HIGH (ref 0.00–1.49)
INR: 1.97 — ABNORMAL HIGH (ref 0.00–1.49)
Prothrombin Time: 23.4 seconds — ABNORMAL HIGH (ref 11.6–15.2)
Prothrombin Time: 22.3 seconds — ABNORMAL HIGH (ref 11.6–15.2)

## 2015-09-26 LAB — URINE CULTURE: Culture: NO GROWTH

## 2015-09-26 LAB — HEPATITIS B SURFACE ANTIGEN: Hepatitis B Surface Ag: NEGATIVE

## 2015-09-26 LAB — BLOOD GAS, ARTERIAL
Acid-base deficit: 13.5 mmol/L — ABNORMAL HIGH (ref 0.0–2.0)
Bicarbonate: 14.2 mEq/L — ABNORMAL LOW (ref 20.0–24.0)
DRAWN BY: 257701
FIO2: 1
O2 SAT: 99.5 %
PCO2 ART: 41.4 mmHg (ref 35.0–45.0)
PEEP: 5 cmH2O
Patient temperature: 37
RATE: 24 resp/min
TCO2: 13.8 mmol/L (ref 0–100)
VT: 600 mL
pH, Arterial: 7.162 — CL (ref 7.350–7.450)
pO2, Arterial: 352 mmHg — ABNORMAL HIGH (ref 80.0–100.0)

## 2015-09-26 LAB — CBC WITH DIFFERENTIAL/PLATELET
BASOS ABS: 0 10*3/uL (ref 0.0–0.1)
Basophils Relative: 0 %
Eosinophils Absolute: 0 10*3/uL (ref 0.0–0.7)
Eosinophils Relative: 0 %
HEMATOCRIT: 31 % — AB (ref 39.0–52.0)
Hemoglobin: 9.9 g/dL — ABNORMAL LOW (ref 13.0–17.0)
LYMPHS ABS: 0.3 10*3/uL — AB (ref 0.7–4.0)
LYMPHS PCT: 2 %
MCH: 24.1 pg — ABNORMAL LOW (ref 26.0–34.0)
MCHC: 31.9 g/dL (ref 30.0–36.0)
MCV: 75.4 fL — AB (ref 78.0–100.0)
Monocytes Absolute: 0.6 10*3/uL (ref 0.1–1.0)
Monocytes Relative: 4 %
NEUTROS ABS: 13.3 10*3/uL — AB (ref 1.7–7.7)
Neutrophils Relative %: 94 %
Platelets: 258 10*3/uL (ref 150–400)
RBC: 4.11 MIL/uL — AB (ref 4.22–5.81)
RDW: 17.8 % — ABNORMAL HIGH (ref 11.5–15.5)
WBC: 14.2 10*3/uL — AB (ref 4.0–10.5)

## 2015-09-26 LAB — APTT
APTT: 35 s (ref 24–37)
APTT: 31 s (ref 24–37)

## 2015-09-26 LAB — CARBOXYHEMOGLOBIN
Carboxyhemoglobin: 1.8 % — ABNORMAL HIGH (ref 0.5–1.5)
Methemoglobin: 1 % (ref 0.0–1.5)
O2 SAT: 77.4 %
Total hemoglobin: 10 g/dL — ABNORMAL LOW (ref 13.5–18.0)

## 2015-09-26 LAB — PREPARE FRESH FROZEN PLASMA
UNIT DIVISION: 0
Unit division: 0
Unit division: 0
Unit division: 0

## 2015-09-26 LAB — HEPATITIS A ANTIBODY, IGM: HEP A IGM: NEGATIVE

## 2015-09-26 LAB — RENAL FUNCTION PANEL
ALBUMIN: 1.8 g/dL — AB (ref 3.5–5.0)
Anion gap: 15 (ref 5–15)
BUN: 113 mg/dL — ABNORMAL HIGH (ref 6–20)
CALCIUM: 6 mg/dL — AB (ref 8.9–10.3)
CO2: 24 mmol/L (ref 22–32)
CREATININE: 4.72 mg/dL — AB (ref 0.61–1.24)
Chloride: 100 mmol/L — ABNORMAL LOW (ref 101–111)
GFR, EST AFRICAN AMERICAN: 15 mL/min — AB (ref >60)
GFR, EST NON AFRICAN AMERICAN: 13 mL/min — AB (ref >60)
Glucose, Bld: 136 mg/dL — ABNORMAL HIGH (ref 65–99)
PHOSPHORUS: 7.1 mg/dL — AB (ref 2.5–4.6)
Potassium: 4.3 mmol/L (ref 3.5–5.1)
SODIUM: 139 mmol/L (ref 135–145)

## 2015-09-26 LAB — CALCIUM, IONIZED: CALCIUM, IONIZED, SERUM: 3.6 mg/dL — AB (ref 4.5–5.6)

## 2015-09-26 LAB — FIBRINOGEN
Fibrinogen: 276 mg/dL (ref 204–475)
Fibrinogen: 294 mg/dL (ref 204–475)

## 2015-09-26 LAB — HEPATITIS B SURFACE ANTIBODY, QUANTITATIVE: Hepatitis B-Post: <3.1 m[IU]/mL — ABNORMAL LOW (ref >9.9)

## 2015-09-26 LAB — HEPATITIS C ANTIBODY: HCV Ab: <0.1 s/co ratio (ref 0.0–0.9)

## 2015-09-26 LAB — GLUCOSE, CAPILLARY
GLUCOSE-CAPILLARY: 116 mg/dL — AB (ref 65–99)
GLUCOSE-CAPILLARY: 128 mg/dL — AB (ref 65–99)
GLUCOSE-CAPILLARY: 131 mg/dL — AB (ref 65–99)
Glucose-Capillary: 128 mg/dL — ABNORMAL HIGH (ref 65–99)
Glucose-Capillary: 137 mg/dL — ABNORMAL HIGH (ref 65–99)
Glucose-Capillary: 128 mg/dL — ABNORMAL HIGH (ref 65–99)

## 2015-09-26 LAB — LACTIC ACID, PLASMA: Lactic Acid, Venous: 2.2 mmol/L (ref 0.5–2.0)

## 2015-09-26 LAB — CK: CK TOTAL: 674 U/L — AB (ref 49–397)

## 2015-09-26 LAB — MAGNESIUM: MAGNESIUM: 2.5 mg/dL — AB (ref 1.7–2.4)

## 2015-09-26 LAB — HEPATITIS B CORE ANTIBODY, TOTAL: HEP B C TOTAL AB: NEGATIVE

## 2015-09-26 SURGERY — EGD (ESOPHAGOGASTRODUODENOSCOPY)
Anesthesia: Moderate Sedation

## 2015-09-26 MED ORDER — SODIUM CHLORIDE 0.9 % IV SOLN
1.0000 g | Freq: Once | INTRAVENOUS | Status: AC
  Administered 2015-09-26: 1 g via INTRAVENOUS
  Filled 2015-09-26: qty 10

## 2015-09-26 MED ORDER — WHITE PETROLATUM GEL
Status: AC
  Administered 2015-09-26: 0.2
  Filled 2015-09-26: qty 1

## 2015-09-26 MED ORDER — LIDOCAINE HCL (PF) 1 % IJ SOLN
INTRAMUSCULAR | Status: AC
  Filled 2015-09-26: qty 10

## 2015-09-26 MED ORDER — MIDAZOLAM HCL 2 MG/2ML IJ SOLN
2.0000 mg | INTRAMUSCULAR | Status: DC | PRN
  Administered 2015-09-26 – 2015-09-28 (×9): 2 mg via INTRAVENOUS
  Filled 2015-09-26 (×9): qty 2

## 2015-09-26 MED ORDER — FENTANYL CITRATE (PF) 100 MCG/2ML IJ SOLN
INTRAMUSCULAR | Status: AC
  Filled 2015-09-26: qty 2

## 2015-09-26 MED ORDER — MIDAZOLAM HCL 5 MG/ML IJ SOLN
INTRAMUSCULAR | Status: AC
  Filled 2015-09-26: qty 2

## 2015-09-26 MED ORDER — NOREPINEPHRINE BITARTRATE 1 MG/ML IV SOLN
0.0000 ug/min | INTRAVENOUS | Status: DC
  Administered 2015-09-26: 15 ug/min via INTRAVENOUS
  Administered 2015-09-26: 20 ug/min via INTRAVENOUS
  Administered 2015-09-26: 20.053 ug/min via INTRAVENOUS
  Administered 2015-09-27: 10 ug/min via INTRAVENOUS
  Administered 2015-09-27: 15 ug/min via INTRAVENOUS
  Administered 2015-09-29: 8 ug/min via INTRAVENOUS
  Filled 2015-09-26 (×4): qty 16

## 2015-09-26 MED ORDER — IPRATROPIUM-ALBUTEROL 0.5-2.5 (3) MG/3ML IN SOLN
3.0000 mL | Freq: Four times a day (QID) | RESPIRATORY_TRACT | Status: DC
  Administered 2015-09-26 – 2015-09-30 (×16): 3 mL via RESPIRATORY_TRACT
  Filled 2015-09-26 (×15): qty 3

## 2015-09-26 MED ORDER — SODIUM CHLORIDE 0.9 % IV SOLN
Freq: Once | INTRAVENOUS | Status: AC
  Administered 2015-09-26: 09:00:00 via INTRAVENOUS

## 2015-09-26 NOTE — Progress Notes (Signed)
Initial Nutrition Assessment  DOCUMENTATION CODES:   Not applicable  INTERVENTION:    If unable to extubate soon and okay to use gut for nutrition, recommend initiate TF via OGT with Vital AF 1.2 at 25 ml/h and Prostat 30 ml BID on day 1; on day 2, d/c Prostat and increase to goal rate of 75 ml/h (1800 ml per day) to provide 2160 kcals, 135 gm protein, 1460 ml free water daily.  NUTRITION DIAGNOSIS:   Inadequate oral intake related to inability to eat as evidenced by NPO status.  GOAL:   Patient will meet greater than or equal to 90% of their needs  MONITOR:   Vent status, Weight trends, Labs, I & O's  REASON FOR ASSESSMENT:   Ventilator    ASSESSMENT:   54yo male smoker with recent diagnosis lymphoma (still in early stages of workup) who presented 9/25 with progressive back pain. In ER found to be tachycardic, hypotensive with lactate 14, AKI and coagulopathy.   Labs reviewed: BUN, creatinine, magnesium, phosphorus elevated. Patient with AKI in the setting of volume depletion and NSAID use. S/P US guided left neck biopsy and upper endoscopy 9/27. Spoke with RN about patient. No plans to start nutrition today. May be able to extubate tomorrow.   Patient is currently intubated on ventilator support MV: 15.2 L/min Temp (24hrs), Avg:97.9 F (36.6 C), Min:96.3 F (35.7 C), Max:99 F (37.2 C)   Diet Order:  Diet NPO time specified Except for: Sips with Meds  Skin:  Reviewed, no issues  Last BM:  unknown  Height:   Ht Readings from Last 1 Encounters:  09/25/15 5' 11.5" (1.816 m)    Weight:   Wt Readings from Last 1 Encounters:  09/22/2015 204 lb 5.9 oz (92.7 kg)    Ideal Body Weight:  79.5 kg  BMI:  Body mass index is 28.11 kg/(m^2).  Estimated Nutritional Needs:   Kcal:  2195  Protein:  130-145 gm  Fluid:  2.2-2.4 L  EDUCATION NEEDS:   No education needs identified at this time  Molli Barrows, [place], [place], [place] Pager [phone] After Hours Pager  [phone]

## 2015-09-26 NOTE — Op Note (Signed)
[facility] [place], [id number]   ENDOSCOPY PROCEDURE REPORT  PATIENT: Herson, Prichard  MR#: [id number] BIRTHDATE: 07-29-1961 , 61  yrs. old GENDER: male ENDOSCOPIST: Ladene Artist, MD, [facility] REFERRED BY:  Elsie Stain, M.D. PROCEDURE DATE:  09/21/2015 PROCEDURE:  EGD, diagnostic ASA CLASS:     Class IV INDICATIONS:  hematemesis. MEDICATIONS: Residual sedation present, Fentanyl 100 mcg IV, and Versed 4 mg IV TOPICAL ANESTHETIC: none DESCRIPTION OF PROCEDURE: After the risks benefits and alternatives of the procedure were thoroughly explained, informed consent was obtained. Procedure performed in ICU.  The PENTAX GASTOROSCOPE [id number] endoscope was introduced through the mouth and advanced to the second portion of the duodenum, with limited exam of portions of the proximal stomach due to retained solids, fluids.  The instrument was slowly withdrawn as the mucosa was fully examined.    ESOPHAGUS: There was LA Class C esophagitis (Mucosal breaks continuous between > 2 mucosal folds, but involving less than 75% of the esophageal circumference) noted. Dark adherent spots on the erosions c/w old blood. No fresh blood or active bleeding.  The esophagus otherwise appeared normal. NG tube in place. STOMACH: The mucosa of the stomach appeared normal. Retained turbid liquid and some solids in the proximal body and fundus limited the exam. DUODENUM: The duodenal mucosa showed no abnormalities in the bulb and 2nd part of the duodenum.  Retroflexed views revealed a small hiatal hernia.  The scope was then withdrawn from the patient and the procedure completed.  COMPLICATIONS: There were no immediate complications.  ENDOSCOPIC IMPRESSION: 1.   LA Class C esophagitis with evidence of recent bleed 2.   Small hiatal hernia 3.   EGD otherwise appeared normal with limited visualization of the proximal stomach  RECOMMENDATIONS: 1.  Anti-reflux  regimen long term 2.  Continue PPI infusion for 3 days then PPI bid 3.  DC NGT    [R eSigned:  Ladene Artist, MD, [facility] 09/06/2015 10:40 AM

## 2015-09-26 NOTE — Procedures (Signed)
Interventional Radiology Procedure Note  Procedure: Bedside US guided left neck mass biopsy.  3 x 18 G core.   Presumed lymphoma. No tissue diagnosis. .  Complications: None Recommendations:  - F/U labs.  - Dressing may be changed after 24 hours.    Signed,  Dulcy Fanny. Earleen Newport, DO

## 2015-09-26 NOTE — Progress Notes (Signed)
RT Note:  Pt transported to and from MRI without event.  RT will continue to monitor.

## 2015-09-26 NOTE — Consult Note (Signed)
Advanced Heart Failure Team Rounding Note  Referring Physician: Dr Vaughan Browner Primary Physician: Primary Cardiologist:  None Oncologist: Dr Marin Olp  Reason for Consultation: Acute Systolic Heart Failure   HPI:    Paul Morton is a 54 year old male with h/o tobacco and ETOH abuse who recently developed large cervical adenopathy and intractable back pain. Underwent PET scan earlier this month which showed hypermetabolic left cervical lymphadenopathy involving levels II-IV and presence of hepatomegaly with diffuse liver hypermetabolism suggesting probably lymphoma.   Admitted 9/26 with multisystem organ failure. Echo performed and EF 10-15% with moderate RV dysfunction. Initial co-ox drawn was 55%. Started on milrinone. Intubated and transferred to [facility]. Developed large GI bleed. EGD today with esophagitis.   Underwent core biopsy of cervical node 9/27.   Remains on milrinone and norepi. Starting to make urine. K down to 4.3 Creatinine 4.2 -> 4.7. Lactate down to 2.2. LFTs improving. Co-ox 77%. ABG 7.31/35/78/93%      Objective:    Vital Signs:   Temp:  [96.3 F (35.7 C)-99 F (37.2 C)] 97.8 F (36.6 C) (09/27 0957) Pulse Rate:  [106-122] 109 (09/27 0957) Resp:  [6-26] 26 (09/27 0957) BP: (89-108)/(44-63) 91/47 mmHg (09/27 0957) SpO2:  [92 %-100 %] 99 % (09/27 1001) FiO2 (%):  [40 %-100 %] 40 % (09/27 0818) Weight:  [92.7 kg (204 lb 5.9 oz)] 92.7 kg (204 lb 5.9 oz) (09/27 0500)    Weight change: Filed Weights   09/07/2015 2100 09/25/15 0426 09/07/2015 0500  Weight: 88.7 kg (195 lb 8.8 oz) 88.7 kg (195 lb 8.8 oz) 92.7 kg (204 lb 5.9 oz)    Intake/Output:   Intake/Output Summary (Last 24 hours) at 09/16/2015 1155 Last data filed at 09/11/2015 0915  Gross per 24 hour  Intake 3378.21 ml  Output   3500 ml  Net -121.79 ml     Physical Exam: General:  Intubated sedated HEENT: normal except for ETT Neck: supple. RIJ TLC . Prominent left neck lymphadenopathy Cor: PMI laterally  displaced. Tachy regular + s3 Lungs: + rhonchi Abdomen: soft, nontender, nondistended. No bruits or masses. Good bowel sounds. Extremities: no cyanosis, clubbing, rash, tr edema Neuro: intubated sedated  Telemetry: sinus tach 100-115  Labs: Basic Metabolic Panel:  Recent Labs Lab 09/25/2015 1632 09/14/2015 1656 09/11/2015 1935 09/03/2015 2237 09/25/15 0400 09/25/15 1524 09/09/2015 0539 09/10/2015 0540  NA 136 132*  --  137 139 140  --  139  K 6.4* 6.0*  --  5.8* 6.2* 5.5*  --  4.3  CL 97* 102  --  103 103 103  --  100*  CO2 10*  --   --  13* 14* 18*  --  24  GLUCOSE 65 58*  --  114* 145* 148*  --  136*  BUN 89* 80*  --  92* 96* 106*  --  113*  CREATININE 3.43* 3.50*  --  3.19* 3.47* 4.23*  --  4.72*  CALCIUM 8.5*  --   --  7.6* 7.3* 7.0*  --  6.0*  MG  --   --  2.9*  --  2.8*  --  2.5*  --   PHOS  --   --  11.0*  --  11.1*  --   --  7.1*    Liver Function Tests:  Recent Labs Lab 09/09/2015 1632 09/25/15 0400 09/25/15 1524 08/31/2015 0539 09/07/2015 0540  AST 389* 701* 649* 516*  --   ALT 330* 345* 347* 290*  --   ALKPHOS  266* 230* 215* 173*  --   BILITOT 4.1* 4.3* 5.1* 5.8*  --   PROT 6.8 5.8* 5.2* 5.2*  --   ALBUMIN 2.6* 2.3* 2.0* 1.9* 1.8*   No results for input(s): LIPASE, AMYLASE in the last 168 hours.  Recent Labs Lab 09/25/15 1523  AMMONIA 37*    CBC:  Recent Labs Lab 09/18/2015 1632 09/23/2015 1656 09/25/15 0400 09/25/15 1524 09/25/2015 0539  WBC 18.6*  --  24.8* 15.0* 14.2*  NEUTROABS 16.5*  --   --  13.7* 13.3*  HGB 11.8* 15.0 10.9* 11.0* 9.9*  HCT 38.9* 44.0 35.9* 34.9* 31.0*  MCV 79.2  --  79.6 78.3 75.4*  PLT 383  --  335 327 258    Cardiac Enzymes:  Recent Labs Lab 09/13/2015 1630 09/25/15 0400 09/25/15 0800 09/25/15 1524 09/25/2015 0539  CKTOTAL 1533* 1111* 1052* 938* 674*  CKMB  --   --  73.5*  --   --     BNP: BNP (last 3 results) No results for input(s): BNP in the last 8760 hours.  ProBNP (last 3 results) No results for input(s):  PROBNP in the last 8760 hours.   CBG:  Recent Labs Lab 09/25/15 1206 09/25/15 1543 09/25/15 2355 09/02/2015 0402 09/14/2015 0806  GLUCAP 113* 151* 116* 128* 137*    Coagulation Studies:  Recent Labs  09/11/2015 1630 09/25/15 0800 09/25/15 1524 09/25/2015 0539  LABPROT 26.6* 27.6* 28.6* 23.4*  INR 2.49* 2.61* 2.75* 2.10*    Other results: EKG: ST 107 RBBB/LAFB  Imaging: Ct Abdomen Pelvis Wo Contrast  09/02/2015   CLINICAL DATA:  Recent diagnosis of lymphoma with acute renal failure and sepsis and back pain  EXAM: CT ABDOMEN AND PELVIS WITHOUT CONTRAST  TECHNIQUE: Multidetector CT imaging of the abdomen and pelvis was performed following the standard protocol without IV contrast.  COMPARISON:  PET-CT from 09/21/2015  FINDINGS: Lung bases again demonstrate a moderate right-sided pleural effusion. Mild infiltrative changes are noted in the lower lobes bilaterally greater on the right than the left. These changes have increased in the interval from the recent PET-CT.  The liver is homogeneous in attenuation with the exception of a left hepatic cysts. The spleen is within normal limits. The adrenal glands, pancreas and gallbladder are unremarkable. The kidneys demonstrate no obstructive changes. No renal calculi are seen. Mild vascular calcifications are noted in the left kidney.  Diverticular change of the colon is seen. No diverticulitis is noted. A small amount of free pelvic fluid is noted as well as a small amount of fluid surrounding the liver these are similar to that seen on recent PET-CT. The appendix is within normal limits.  The bladder is decompressed by Foley catheter. The osseous structures show no acute abnormality. Degenerative changes are seen. Some mild changes of anasarca are noted.  IMPRESSION: Mild free fluid within the peritoneal cavity similar to that seen on recent PET-CT.  Increase in the degree of bibasilar infiltrate right greater than left with associated right-sided  pleural effusion when compared with the prior PET-CT.  Diverticulosis without diverticulitis.   Electronically Signed   By: Inez Catalina M.D.   On: 09/18/2015 21:19   Dg Chest 1 View  09/02/2015   CLINICAL DATA:  Shortness of breath today. Cervical lymphadenopathy. Central line placement. Initial encounter.  EXAM: CHEST 1 VIEW  COMPARISON:  PET-CT 09/21/2015  FINDINGS: 1831 hours. Right IJ central venous catheter tip is at the lower SVC level. No evidence of pneumothorax. Cardiomegaly appears unchanged. There  is worsening aeration of the lungs with probable mild pulmonary edema. There is probable layering of the pleural effusions demonstrated on recent PET-CT. The bones appear unchanged.  IMPRESSION: Central line placement as described, tip at the lower SVC level. No pneumothorax. Cardiomegaly with edema and bilateral pleural effusions.   Electronically Signed   By: Richardean Sale M.D.   On: 09/06/2015 18:38   Paul Thoracic Spine Wo Contrast  09/23/2015   CLINICAL DATA:  Leg heaviness. Back injury 3 weeks ago. Worsening pain today. Unable to urinate. Pain radiates from the back all the way around to the abdomen.  EXAM: MRI THORACIC SPINE WITHOUT CONTRAST; MRI CERVICAL SPINE WITHOUT CONTRAST  TECHNIQUE: Multiplanar and multiecho pulse sequences of the thoracic spine were obtained without intravenous contrast.; Multiplanar and multiecho pulse sequences of the cervical spine, to include the craniocervical junction and cervicothoracic junction, were obtained according to standard protocol without intravenous contrast.  COMPARISON:  PET-CT 09/21/2015.  Soft tissue neck CT 09/08/2015.  FINDINGS: The study was ordered as a metastasis screening protocol of the total spine. However, after sagittal T1 and sagittal STIR images were obtained of the cervical and upper thoracic spine, the examination was discontinued due to decreased oxygen saturation and critical lab values and the patient was returned to the emergency  department.  Obtained sagittal images extend from the craniocervical junction to the T9 level. Mild motion artifact is present through the cervical spine.  There is reversal of the normal cervical lordosis. Visualized portion of the thoracic spine demonstrates normal alignment. Mild-to-moderate multilevel disc degeneration is present in the mid and lower cervical spine with predominantly type 2 degenerative endplate marrow changes present, most notably at C6-7. Shallow disc protrusions/disc bulging and endplate spurring are present in the cervical spine, however there is no evidence of significant cervical spinal canal stenosis. A tiny disc protrusion is present at T5-6 without spinal stenosis seen in the visualized portion of the thoracic spine.  The visualized spinal cord is normal in caliber without definite signal abnormality identified, although motion limits evaluation in the cervical spine. There is no cord compression. Bulky left-sided cervical lymphadenopathy is more fully evaluated on prior neck CT.  IMPRESSION: Incomplete examination as above. Mild-to-moderate cervical disc degeneration. No evidence of significant spinal stenosis or cord compression in the cervical or visualized thoracic spine.   Electronically Signed   By: Logan Bores M.D.   On: 09/07/2015 18:27   Paul Lumbar Spine Wo Contrast  09/27/2015   CLINICAL DATA:  Low back pain for 1 month unable to urinate. Critically ill. Renal failure. Recent diagnosis of lymphoma.  EXAM: MRI LUMBAR SPINE WITHOUT CONTRAST  TECHNIQUE: Multiplanar, multisequence Paul imaging of the lumbar spine was performed. No intravenous contrast was administered.  COMPARISON:  CT abdomen and pelvis September 24, 2015  FINDINGS: Low signal to noise ratio.  Lumbar vertebral bodies and posterior elements are intact and aligned and maintenance of lumbar lordosis. Using the reference level of the last well-formed intervertebral disc as L5-S1, moderate to severe L5-S1 disc height  loss with moderate to severe subacute discogenic endplate changes. No STIR signal abnormality to suggest acute osseous process. Moderate chronic discogenic endplate changes J2-8. Mild desiccation L4-5 disc.  Conus medullaris terminates at L1-2 and appears normal morphology and signal characteristics. Cauda equina is unremarkable. Bright STIR signal within the paraspinal soft tissues without focal fluid collection.  Level by level evaluation:  L1-2: Small to moderate broad-based disc bulge asymmetric to LEFT. No canal stenosis. Minimal LEFT  neural foraminal narrowing.  L2-3: Small broad-based disc bulge. Mild facet arthropathy and ligamentum flavum redundancy without canal stenosis or neural foraminal narrowing.  L3-4: Small broad-based disc bulge. Mild facet arthropathy and ligamentum flavum redundancy without canal stenosis. Superimposed suspected small LEFT extra foraminal disc protrusion/extrusion. Mild to moderate RIGHT, moderate LEFT neural foraminal narrowing.  L4-5: Small broad-based disc bulge. Mild facet arthropathy and ligamentum flavum redundancy without canal stenosis. Mild to moderate LEFT neural foraminal narrowing.  L5-S1: Small broad-based disc bulge asymmetric to the RIGHT, superimposed small central disc protrusion and tiny central extrusion component suspected, best characterized on sagittal 8/14, without free fragment. Mild facet arthropathy without canal stenosis. Moderate RIGHT, mild LEFT neural foraminal narrowing.  IMPRESSION: Technically suboptimal examination.  No acute lumbar spine fracture, malalignment or suspicious signal.  Paraspinal muscle edema/denervation.  Degenerative change of the lumbar spine without canal stenosis. L3-4 through L5-S1 neural foraminal narrowing: Moderate on the LEFT at L3-4 and on the RIGHT at L5-S1.  Small suspected contiguous central L5-S1 disc extrusion. Possible small LEFT extra foraminal L3-4 disc protrusion/extrusion could affect the exited LEFT L4 nerve.    Electronically Signed   By: Elon Alas M.D.   On: 09/17/2015 04:04   Dg Chest Port 1 View  09/25/2015   CLINICAL DATA:  Respiratory insufficiency. Evaluate ET tube placement.  EXAM: PORTABLE CHEST 1 VIEW  COMPARISON:  09/25/2015  FINDINGS: There is a right IJ catheter with tip in the cavoatrial junction. ET tube tip is above the carina. There is a nasogastric tube with tip in the stomach. Cardiac enlargement noted. Bilateral pulmonary opacities are again noted compatible with edema and/or multifocal infection.  IMPRESSION: 1. Persistent bilateral pulmonary opacities compatible with pneumonia and/or edema.   Electronically Signed   By: Kerby Moors M.D.   On: 09/25/2015 13:31   Dg Chest Port 1 View  09/25/2015   CLINICAL DATA:  Respiratory failure, sepsis, current smoker.  EXAM: PORTABLE CHEST 1 VIEW  COMPARISON:  Portable chest x-ray of September 24, 2015  FINDINGS: The lungs are adequately inflated. Confluent alveolar opacities have progressed in the upper lobes and in the right infrahilar region. The cardiopericardial silhouette remains enlarged. The central pulmonary vascularity is prominent. The right internal jugular venous catheter tip projects over the midportion of the SVC. There is no significant pleural effusion. The bony thorax exhibits no acute abnormality.  IMPRESSION: Worsening of alveolar infiltrates bilaterally consistent with pneumonia or pulmonary edema.   Electronically Signed   By: David  Martinique M.D.   On: 09/25/2015 07:36   Paul C Spine Ltd W/o Cm  09/06/2015   CLINICAL DATA:  Leg heaviness. Back injury 3 weeks ago. Worsening pain today. Unable to urinate. Pain radiates from the back all the way around to the abdomen.  EXAM: MRI THORACIC SPINE WITHOUT CONTRAST; MRI CERVICAL SPINE WITHOUT CONTRAST  TECHNIQUE: Multiplanar and multiecho pulse sequences of the thoracic spine were obtained without intravenous contrast.; Multiplanar and multiecho pulse sequences of the cervical spine,  to include the craniocervical junction and cervicothoracic junction, were obtained according to standard protocol without intravenous contrast.  COMPARISON:  PET-CT 09/21/2015.  Soft tissue neck CT 09/08/2015.  FINDINGS: The study was ordered as a metastasis screening protocol of the total spine. However, after sagittal T1 and sagittal STIR images were obtained of the cervical and upper thoracic spine, the examination was discontinued due to decreased oxygen saturation and critical lab values and the patient was returned to the emergency department.  Obtained sagittal images  extend from the craniocervical junction to the T9 level. Mild motion artifact is present through the cervical spine.  There is reversal of the normal cervical lordosis. Visualized portion of the thoracic spine demonstrates normal alignment. Mild-to-moderate multilevel disc degeneration is present in the mid and lower cervical spine with predominantly type 2 degenerative endplate marrow changes present, most notably at C6-7. Shallow disc protrusions/disc bulging and endplate spurring are present in the cervical spine, however there is no evidence of significant cervical spinal canal stenosis. A tiny disc protrusion is present at T5-6 without spinal stenosis seen in the visualized portion of the thoracic spine.  The visualized spinal cord is normal in caliber without definite signal abnormality identified, although motion limits evaluation in the cervical spine. There is no cord compression. Bulky left-sided cervical lymphadenopathy is more fully evaluated on prior neck CT.  IMPRESSION: Incomplete examination as above. Mild-to-moderate cervical disc degeneration. No evidence of significant spinal stenosis or cord compression in the cervical or visualized thoracic spine.   Electronically Signed   By: Logan Bores M.D.   On: 09/22/2015 18:27     Medications:     Current Medications: . sodium chloride   Intravenous Once  . sodium chloride    Intravenous Once  . calcium gluconate  1 g Intravenous Once  . dexamethasone (DECADRON) IVPB [facility]  40 mg Intravenous Q24H  . fentaNYL (SUBLIMAZE) injection  50 mcg Intravenous Once  . furosemide  160 mg Intravenous 3 times per day  . lidocaine (PF)      . piperacillin-tazobactam (ZOSYN)  IV  3.375 g Intravenous Q8H    Infusions: . sodium chloride 10 mL/hr at 09/25/15 0949  . fentaNYL infusion INTRAVENOUS 300 mcg/hr (09/09/2015 0300)  . milrinone 0.25 mcg/kg/min (09/25/15 2357)  . norepinephrine (LEVOPHED) Adult infusion    . pantoprozole (PROTONIX) infusion 8 mg/hr (09/02/2015 0300)  .  sodium bicarbonate  infusion 1000 mL 50 mL/hr at 09/16/2015 0055     Assessment:   1. Cardiogenic shock 2. Acute systolic HF EF 77-41% 3. Acute respiratory failure 4. Multisystem organ failure 5. Acute kidney injury 6. Shock liver 7. Hyperkalemia 8. 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 Tobacco use 10. ETOH use - 4+ beers per day 11. Family h/o NICM in brother 49. Massive upper GI bleed     --severe esophagitis by EGD on 9/27 13. Coagulopathy - due t shock liver with ? DIC  Plan/Discussion:    Complicated case. It appears that he may have underlying lymphoma however the most salient feature of his illness currently appears to be cardiogenic shock with diffuse hypoperfusion/lactic acidosis and multisystem organ failure in the setting of newly discovered profound systolic dysfunction with EF 10-15%. 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 Ferritin likely not high enough to suggest hemochromatosis.    Fortunately, his lactic acidosis and renal function appear to be improving with inotropic support. Would continue supportive care with inotropes (milrinone and norepi) and diuresis. Keep MAP > 60 and MV sat >= 60%. Seems like we will be able to avoid CVVHD. He has underwent core biopsy of left cervical mass today and we will await  pathology. He is on very high-dose decadron - hopefully we can wean this as he improves. I doubt there is tumor lysis syndrome here.    Once stabilized will need further evaluation/tretment of underlying condition/malignancy as well as possible coronary angiography.   We will follow continue  to follow closely.   The patient is critically ill with multiple organ systems failure and requires high complexity decision making for assessment and support, frequent evaluation and titration of therapies, application of advanced monitoring technologies and extensive interpretation of multiple databases.   Critical Care Time devoted to patient care services described in this note is 35 Minutes.   Bensimhon, Daniel,MD  Advanced Heart Failure Team Pager [phone] (M-F; 7a - 4p)  Please contact [facility] for night-coverage after hours (4p -7a ) and weekends on [email or web address]

## 2015-09-26 NOTE — Progress Notes (Signed)
Chief Complaint: Patient was seen in consultation today for cervical lymphadenopathy  Chief Complaint  Patient presents with  . Back Pain  . Abdominal Pain   at the request of Oncology   Referring Physician(s): Oncology   History of Present Illness: Paul Morton is a 54 y.o. male with left sided neck mass/lymphadenopathy with request for biopsy concern for lymphoma. Patient now admitted with cardiogenic shock EF 10%, AKI, liver injury, UGI bleed, VDRF. History is obtained per chart review as patient is on Vent currently.   Past Medical History  Diagnosis Date  . Alcohol abuse 08/2015    admits to 6 pack beer daily.   . Mass of neck 05/2015    multiple soft tissue masses, palpable and seen on CT.  present since at leat 05/2015.   Marland Kitchen Anemia 08/2015  . Elevated LFTs 08/2015  . Herpes zoster 03/2014    right C3-4, T1-2 distribution.   . Gynecomastia 08/2015    per PET scan  . Opacity of lung on imaging study 08/2015    bilateral, with effusion and mild ephysema per PET scan  . Diverticulosis of colon 08/2015    per PET scan  . Hepatomegaly 08/2015    and 1.6 cm liver cyst per PET.   Marland Kitchen Ascites 08/2015    small abd ascites and mild anasarca per PET  . CHF (congestive heart failure) 08/2015    Severe dilatation of all of the heart chambers. EF 10 to 15%.  Severe LV and moderate RV systolic dysfunction. Restrictive pattern of diastolic dysfunction. Moderate aortic, mitral, pulmonic and tricuspid regurgitation. Mild to moderate pulmonary hypertension  . AKI (acute kidney injury) 08/2015  . 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 Tonsillectomy      Allergies: Peanut-containing drug products  Medications: Prior to Admission medications   Medication Sig Start Date End Date Taking? Authorizing Provider  cyclobenzaprine (FLEXERIL) 10 MG tablet Take 1 tablet (10 mg total) by mouth 3 (three) times daily  as needed for muscle spasms. 09/08/15  Yes Roselee Culver, MD  EPINEPHrine 0.3 mg/0.3 mL IJ SOAJ injection Inject 0.3 mLs (0.3 mg total) into the muscle once. 09/08/15  Yes Roselee Culver, MD  esomeprazole (NEXIUM) 20 MG capsule Take 20 mg by mouth daily at 12 noon.   Yes Historical Provider, MD  Multiple Vitamin (MULTIVITAMIN WITH MINERALS) TABS tablet Take 1 tablet by mouth daily.   Yes Historical Provider, MD  naproxen sodium (ANAPROX DS) 550 MG tablet Take 1 tablet (550 mg total) by mouth 2 (two) times daily with a meal. 09/08/15 09/07/16 Yes Roselee Culver, MD  predniSONE (DELTASONE) 20 MG tablet Take 3 PO QAM x3days, 2 PO QAM x3days, 1 PO QAM x3days 09/22/15  Yes Tereasa Coop, PA-C  traMADol (ULTRAM) 50 MG tablet Take 1 tablet (50 mg total) by mouth every 8 (eight) hours as needed. Patient taking differently: Take 50 mg by mouth every 8 (eight) hours as needed for moderate pain.  09/08/15  Yes Roselee Culver, MD     Family History  Problem Relation Age of Onset  . Lung cancer Father     Social History   Social History  . Marital Status: Married    Spouse Name: N/A  . Number of Children: N/A  . Years of Education: N/A   Social History Main Topics  . Smoking  status: Current Every Day Smoker -- 1.00 packs/day    Types: Cigarettes  . Smokeless tobacco: Never Used  . Alcohol Use: 0.0 oz/week    0 Standard drinks or equivalent per week  . Drug Use: No  . Sexual Activity: Not Currently   Other Topics Concern  . 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    Review of Systems not obtained secondary to patient on vent  Vital Signs: BP 91/47 mmHg  Pulse 109  Temp(Src) 97.8 F (36.6 C) (Core (Comment))  Resp 26  Ht 5' 11.5" (1.816 m)  Wt 204 lb 5.9 oz (92.7 kg)  BMI 28.11 kg/m2  SpO2 99%  Physical Exam General: Intubated, responds to stimuli  Neck: Large left sided cervical mass/adenopathy Heart: Tachycardic,  regular Lungs: CTA anteriorly   Imaging: Ct Abdomen Pelvis Wo Contrast  09/27/2015   CLINICAL DATA:  Recent diagnosis of lymphoma with acute renal failure and sepsis and back pain  EXAM: CT ABDOMEN AND PELVIS WITHOUT CONTRAST  TECHNIQUE: Multidetector CT imaging of the abdomen and pelvis was performed following the standard protocol without IV contrast.  COMPARISON:  PET-CT from 09/21/2015  FINDINGS: Lung bases again demonstrate a moderate right-sided pleural effusion. Mild infiltrative changes are noted in the lower lobes bilaterally greater on the right than the left. These changes have increased in the interval from the recent PET-CT.  The liver is homogeneous in attenuation with the exception of a left hepatic cysts. The spleen is within normal limits. The adrenal glands, pancreas and gallbladder are unremarkable. The kidneys demonstrate no obstructive changes. No renal calculi are seen. Mild vascular calcifications are noted in the left kidney.  Diverticular change of the colon is seen. No diverticulitis is noted. A small amount of free pelvic fluid is noted as well as a small amount of fluid surrounding the liver these are similar to that seen on recent PET-CT. The appendix is within normal limits.  The bladder is decompressed by Foley catheter. The osseous structures show no acute abnormality. Degenerative changes are seen. Some mild changes of anasarca are noted.  IMPRESSION: Mild free fluid within the peritoneal cavity similar to that seen on recent PET-CT.  Increase in the degree of bibasilar infiltrate right greater than left with associated right-sided pleural effusion when compared with the prior PET-CT.  Diverticulosis without diverticulitis.   Electronically Signed   By: Inez Catalina M.D.   On: 09/28/2015 21:19   Dg Chest 1 View  09/27/2015   CLINICAL DATA:  Shortness of breath today. Cervical lymphadenopathy. Central line placement. Initial encounter.  EXAM: CHEST 1 VIEW  COMPARISON:  PET-CT  09/21/2015  FINDINGS: 1831 hours. Right IJ central venous catheter tip is at the lower SVC level. No evidence of pneumothorax. Cardiomegaly appears unchanged. There is worsening aeration of the lungs with probable mild pulmonary edema. There is probable layering of the pleural effusions demonstrated on recent PET-CT. The bones appear unchanged.  IMPRESSION: Central line placement as described, tip at the lower SVC level. No pneumothorax. Cardiomegaly with edema and bilateral pleural effusions.   Electronically Signed   By: Richardean Sale M.D.   On: 09/06/2015 18:38   Ct Soft Tissue Neck W Contrast  09/08/2015   CLINICAL DATA:  Left-sided neck swelling and mass for 2 months  EXAM: CT NECK WITH CONTRAST  TECHNIQUE: Multidetector CT imaging of the neck was performed using the standard protocol following the bolus administration of intravenous contrast.  CONTRAST:  34mL OMNIPAQUE IOHEXOL 300 MG/ML  SOLN  COMPARISON:  None.  FINDINGS: Pharynx and larynx: Normal  Salivary glands: Normal  Thyroid: Normal  Lymph nodes: Numerous large isodense soft tissue masses in the supraclavicular left side deep to sternocleidomastoid musculature consistent with massive adenopathy. The largest single mass measures 3 x 4 cm. In total the complex of presumed adenopathy measures about 9 cm craniocaudal by 4 x 6.9 cm transverse. Adenopathy begins at the level of the carotid bulb and extends inferiorly to behind the left clavicle.  Vascular: Bilateral carotid bulb calcification. There are numerous varices in the dorsal paraspinous soft tissues of the upper thoracic and cervical spine region.  Limited intracranial: Negative  Visualized orbits: Orbits not visualized  Mastoids and visualized paranasal sinuses: Clear  Skeleton: No acute abnormalities. Reversed lordosis cervical spine with moderate central cervical spine degenerative disc disease at multiple levels.  Upper chest: Is numerous small mediastinal lymph nodes visualized only  partially.  IMPRESSION: Large burden of numerous supraclavicular soft tissue masses consistent with adenopathy. Findings are concerning for possibility of lymphoma or metastasis.   Electronically Signed   By: Skipper Cliche M.D.   On: 09/08/2015 12:28   Mr Thoracic Spine Wo Contrast  09/16/2015   CLINICAL DATA:  Leg heaviness. Back injury 3 weeks ago. Worsening pain today. Unable to urinate. Pain radiates from the back all the way around to the abdomen.  EXAM: MRI THORACIC SPINE WITHOUT CONTRAST; MRI CERVICAL SPINE WITHOUT CONTRAST  TECHNIQUE: Multiplanar and multiecho pulse sequences of the thoracic spine were obtained without intravenous contrast.; Multiplanar and multiecho pulse sequences of the cervical spine, to include the craniocervical junction and cervicothoracic junction, were obtained according to standard protocol without intravenous contrast.  COMPARISON:  PET-CT 09/21/2015.  Soft tissue neck CT 09/08/2015.  FINDINGS: The study was ordered as a metastasis screening protocol of the total spine. However, after sagittal T1 and sagittal STIR images were obtained of the cervical and upper thoracic spine, the examination was discontinued due to decreased oxygen saturation and critical lab values and the patient was returned to the emergency department.  Obtained sagittal images extend from the craniocervical junction to the T9 level. Mild motion artifact is present through the cervical spine.  There is reversal of the normal cervical lordosis. Visualized portion of the thoracic spine demonstrates normal alignment. Mild-to-moderate multilevel disc degeneration is present in the mid and lower cervical spine with predominantly type 2 degenerative endplate marrow changes present, most notably at C6-7. Shallow disc protrusions/disc bulging and endplate spurring are present in the cervical spine, however there is no evidence of significant cervical spinal canal stenosis. A tiny disc protrusion is present at  T5-6 without spinal stenosis seen in the visualized portion of the thoracic spine.  The visualized spinal cord is normal in caliber without definite signal abnormality identified, although motion limits evaluation in the cervical spine. There is no cord compression. Bulky left-sided cervical lymphadenopathy is more fully evaluated on prior neck CT.  IMPRESSION: Incomplete examination as above. Mild-to-moderate cervical disc degeneration. No evidence of significant spinal stenosis or cord compression in the cervical or visualized thoracic spine.   Electronically Signed   By: Logan Bores M.D.   On: 09/05/2015 18:27   Mr Lumbar Spine Wo Contrast  09/20/2015   CLINICAL DATA:  Low back pain for 1 month unable to urinate. Critically ill. Renal failure. Recent diagnosis of lymphoma.  EXAM: MRI LUMBAR SPINE WITHOUT CONTRAST  TECHNIQUE: Multiplanar, multisequence MR imaging of the lumbar spine  was performed. No intravenous contrast was administered.  COMPARISON:  CT abdomen and pelvis September 24, 2015  FINDINGS: Low signal to noise ratio.  Lumbar vertebral bodies and posterior elements are intact and aligned and maintenance of lumbar lordosis. Using the reference level of the last well-formed intervertebral disc as L5-S1, moderate to severe L5-S1 disc height loss with moderate to severe subacute discogenic endplate changes. No STIR signal abnormality to suggest acute osseous process. Moderate chronic discogenic endplate changes D5-3. Mild desiccation L4-5 disc.  Conus medullaris terminates at L1-2 and appears normal morphology and signal characteristics. Cauda equina is unremarkable. Bright STIR signal within the paraspinal soft tissues without focal fluid collection.  Level by level evaluation:  L1-2: Small to moderate broad-based disc bulge asymmetric to LEFT. No canal stenosis. Minimal LEFT neural foraminal narrowing.  L2-3: Small broad-based disc bulge. Mild facet arthropathy and ligamentum flavum redundancy  without canal stenosis or neural foraminal narrowing.  L3-4: Small broad-based disc bulge. Mild facet arthropathy and ligamentum flavum redundancy without canal stenosis. Superimposed suspected small LEFT extra foraminal disc protrusion/extrusion. Mild to moderate RIGHT, moderate LEFT neural foraminal narrowing.  L4-5: Small broad-based disc bulge. Mild facet arthropathy and ligamentum flavum redundancy without canal stenosis. Mild to moderate LEFT neural foraminal narrowing.  L5-S1: Small broad-based disc bulge asymmetric to the RIGHT, superimposed small central disc protrusion and tiny central extrusion component suspected, best characterized on sagittal 8/14, without free fragment. Mild facet arthropathy without canal stenosis. Moderate RIGHT, mild LEFT neural foraminal narrowing.  IMPRESSION: Technically suboptimal examination.  No acute lumbar spine fracture, malalignment or suspicious signal.  Paraspinal muscle edema/denervation.  Degenerative change of the lumbar spine without canal stenosis. L3-4 through L5-S1 neural foraminal narrowing: Moderate on the LEFT at L3-4 and on the RIGHT at L5-S1.  Small suspected contiguous central L5-S1 disc extrusion. Possible small LEFT extra foraminal L3-4 disc protrusion/extrusion could affect the exited LEFT L4 nerve.   Electronically Signed   By: Elon Alas M.D.   On: 09/27/2015 04:04   Nm Pet Image Initial (pi) Skull Base To Thigh  09/21/2015   CLINICAL DATA:  Initial treatment strategy for extensive left cervical lymphadenopathy.  EXAM: NUCLEAR MEDICINE PET SKULL BASE TO THIGH  TECHNIQUE: 9.1 mCi F-18 FDG was injected intravenously. Full-ring PET imaging was performed from the skull base to thigh after the radiotracer. CT data was obtained and used for attenuation correction and anatomic localization.  FASTING BLOOD GLUCOSE:  Value: 95 mg/dl  COMPARISON:  09/08/2015 neck CT.  FINDINGS: NECK  There is bulky confluent hypermetabolic cervical lymphadenopathy  involving the level 2, 3, 4 and 5 left neck lymph node chains. A representative 2.4 cm left level 2 node (series 4/ image 37) demonstrates max SUV 16.6. A representative 2.6 cm left level 3 node (4/51) demonstrates max SUV 16.6. A representative 1.5 cm left level 4 node (4/57) demonstrates max SUV 5.7. A representative 3.6 cm left level 5 node (4/46) demonstrates max SUV 20.9. No hypermetabolic right cervical nodes. No hypermetabolic mucosal surface mass is seen in the head or neck.  CHEST  No hypermetabolic axillary, mediastinal or hilar nodes.  Small right and trace left layering pleural effusions. Cardiomegaly. Mild mitral annular calcification. There is hypermetabolic patchy ground-glass opacity and consolidation in the anterior apical right upper lobe, posterior lingula and posterior basilar right lower lobe with max SUV 7.0 in the right lower lobe. There is a mildly hypermetabolic 1.8 x 1.7 cm subsolid left upper lobe pulmonary nodule (series 6/image 25) with  max SUV 4.0, which appears new compared to the 09/08/2015 neck CT study. There is mild centrilobular emphysema. Symmetric gynecomastia.  ABDOMEN/PELVIS  There is hepatomegaly with diffuse hypermetabolism in the liver. No focal hypermetabolic liver mass. Simple 1.6 cm left liver lobe cyst. Normal size spleen. Diffuse colonic diverticulosis. Small volume ascites. Atherosclerotic nonaneurysmal abdominal aorta. Mild anasarca. Small fat containing right inguinal hernia. Small fat containing umbilical hernia.  No abnormal hypermetabolic activity within the pancreas, adrenal glands, or spleen. No hypermetabolic lymph nodes in the abdomen or pelvis.  SKELETON  No focal hypermetabolic activity to suggest skeletal metastasis.  IMPRESSION: 1. Bulky confluent hypermetabolic left cervical lymphadenopathy involving levels II-V. Given the presence of hepatomegaly with diffuse liver hypermetabolism, a diagnosis of lymphoma is favored, however metastatic carcinoma remains  on the differential. No hypermetabolic mucosal surface mass is seen in the head or neck. 2. Hypermetabolic patchy ground-glass opacity and consolidation in both upper lobes and right lower lobe, favor an infectious or inflammatory etiology. These opacities should be reassessed on a chest CT in 2-3 months. 3. Cardiomegaly. 4. Evidence of third-spacing, including mild anasarca, small right and trace left pleural effusions and small volume ascites. The presence of gynecomastia and hepatomegaly indicates liver disease, and the third-spacing could be on the basis of hypoalbuminemia. Recommend correlation with serum albumin levels. 5. Mild centrilobular emphysema.   Electronically Signed   By: Ilona Sorrel M.D.   On: 09/21/2015 10:05   Dg Chest Port 1 View  09/25/2015   CLINICAL DATA:  Respiratory insufficiency. Evaluate ET tube placement.  EXAM: PORTABLE CHEST 1 VIEW  COMPARISON:  09/25/2015  FINDINGS: There is a right IJ catheter with tip in the cavoatrial junction. ET tube tip is above the carina. There is a nasogastric tube with tip in the stomach. Cardiac enlargement noted. Bilateral pulmonary opacities are again noted compatible with edema and/or multifocal infection.  IMPRESSION: 1. Persistent bilateral pulmonary opacities compatible with pneumonia and/or edema.   Electronically Signed   By: Kerby Moors M.D.   On: 09/25/2015 13:31   Dg Chest Port 1 View  09/25/2015   CLINICAL DATA:  Respiratory failure, sepsis, current smoker.  EXAM: PORTABLE CHEST 1 VIEW  COMPARISON:  Portable chest x-ray of September 24, 2015  FINDINGS: The lungs are adequately inflated. Confluent alveolar opacities have progressed in the upper lobes and in the right infrahilar region. The cardiopericardial silhouette remains enlarged. The central pulmonary vascularity is prominent. The right internal jugular venous catheter tip projects over the midportion of the SVC. There is no significant pleural effusion. The bony thorax exhibits no  acute abnormality.  IMPRESSION: Worsening of alveolar infiltrates bilaterally consistent with pneumonia or pulmonary edema.   Electronically Signed   By: David  Martinique M.D.   On: 09/25/2015 07:36   Mr C Spine Ltd W/o Cm  09/08/2015   CLINICAL DATA:  Leg heaviness. Back injury 3 weeks ago. Worsening pain today. Unable to urinate. Pain radiates from the back all the way around to the abdomen.  EXAM: MRI THORACIC SPINE WITHOUT CONTRAST; MRI CERVICAL SPINE WITHOUT CONTRAST  TECHNIQUE: Multiplanar and multiecho pulse sequences of the thoracic spine were obtained without intravenous contrast.; Multiplanar and multiecho pulse sequences of the cervical spine, to include the craniocervical junction and cervicothoracic junction, were obtained according to standard protocol without intravenous contrast.  COMPARISON:  PET-CT 09/21/2015.  Soft tissue neck CT 09/08/2015.  FINDINGS: The study was ordered as a metastasis screening protocol of the total spine. However, after sagittal T1  and sagittal STIR images were obtained of the cervical and upper thoracic spine, the examination was discontinued due to decreased oxygen saturation and critical lab values and the patient was returned to the emergency department.  Obtained sagittal images extend from the craniocervical junction to the T9 level. Mild motion artifact is present through the cervical spine.  There is reversal of the normal cervical lordosis. Visualized portion of the thoracic spine demonstrates normal alignment. Mild-to-moderate multilevel disc degeneration is present in the mid and lower cervical spine with predominantly type 2 degenerative endplate marrow changes present, most notably at C6-7. Shallow disc protrusions/disc bulging and endplate spurring are present in the cervical spine, however there is no evidence of significant cervical spinal canal stenosis. A tiny disc protrusion is present at T5-6 without spinal stenosis seen in the visualized portion of the  thoracic spine.  The visualized spinal cord is normal in caliber without definite signal abnormality identified, although motion limits evaluation in the cervical spine. There is no cord compression. Bulky left-sided cervical lymphadenopathy is more fully evaluated on prior neck CT.  IMPRESSION: Incomplete examination as above. Mild-to-moderate cervical disc degeneration. No evidence of significant spinal stenosis or cord compression in the cervical or visualized thoracic spine.   Electronically Signed   By: Logan Bores M.D.   On: 09/17/2015 18:27    Labs:  CBC:  Recent Labs  09/06/2015 1632 09/18/2015 1656 09/25/15 0400 09/25/15 1524 09/24/2015 0539  WBC 18.6*  --  24.8* 15.0* 14.2*  HGB 11.8* 15.0 10.9* 11.0* 9.9*  HCT 38.9* 44.0 35.9* 34.9* 31.0*  PLT 383  --  335 327 258    COAGS:  Recent Labs  09/08/2015 1630 09/25/15 0800 09/25/15 1524 09/22/2015 0539  INR 2.49* 2.61* 2.75* 2.10*  APTT 34  --   --  35    BMP:  Recent Labs  09/23/2015 2237 09/25/15 0400 09/25/15 1524 09/08/2015 0540  NA 137 139 140 139  K 5.8* 6.2* 5.5* 4.3  CL 103 103 103 100*  CO2 13* 14* 18* 24  GLUCOSE 114* 145* 148* 136*  BUN 92* 96* 106* 113*  CALCIUM 7.6* 7.3* 7.0* 6.0*  CREATININE 3.19* 3.47* 4.23* 4.72*  GFRNONAA 20* 18* 14* 13*  GFRAA 24* 21* 17* 15*    LIVER FUNCTION TESTS:  Recent Labs  09/21/2015 1632 09/25/15 0400 09/25/15 1524 09/16/2015 0539 09/17/2015 0540  BILITOT 4.1* 4.3* 5.1* 5.8*  --   AST 389* 701* 649* 516*  --   ALT 330* 345* 347* 290*  --   ALKPHOS 266* 230* 215* 173*  --   PROT 6.8 5.8* 5.2* 5.2*  --   ALBUMIN 2.6* 2.3* 2.0* 1.9* 1.8*    TUMOR MARKERS:  Recent Labs  09/12/15 0909  CEA 1.6    Assessment and Plan: VDRF  Cardiogenic Shock on pressor support, Acute systolic CHF EF 10 % AKI UGI bleed for bedside EGD today Left neck soft tissue masses/Cervical lymphadenopathy concern for lymphoma Request for biopsy, oncology following Coagulopathy likely  secondary to liver injury Dr. Earleen Newport has discussed case with Dr. Marin Olp today Will proceed with bedside FNA of left cervical lymph node biopsy Risks and Benefits discussed with the patient's wife over the phone including, but not limited to bleeding, infection or low yield requiring additional tests. All questions were answered, patient's wife is agreeable to proceed. Consent signed and in chart.   SignedHedy Jacob 09/16/2015, 10:05 AM

## 2015-09-26 NOTE — Interval H&P Note (Signed)
History and Physical Interval Note:  09/25/2015 10:07 AM  Paul Morton  has presented today for surgery, with the diagnosis of burgundy bloody emesis  The various methods of treatment have been discussed with the patient and family. After consideration of risks, benefits and other options for treatment, the patient has consented to  Procedure(s): ESOPHAGOGASTRODUODENOSCOPY (EGD) (N/A) as a surgical intervention .  The patient's history has been reviewed, patient examined, no change in status, stable for surgery.  I have reviewed the patient's chart and labs.  Questions were answered to the patient's satisfaction.     Pricilla Riffle. Fuller Plan MD

## 2015-09-26 NOTE — Progress Notes (Addendum)
CRITICAL VALUE ALERT  Critical value received:  La 2.2; cal 6  Date of notification:  09/15/2015  Time of notification:  0638  Critical value read back:Yes.    Nurse who received alert:  km  Responding MD:  elink  Time MD responded:  [phone]

## 2015-09-26 NOTE — Progress Notes (Signed)
[facility] Progress Note    Assessment/ Plan:   1. AKI: Multifactorial in setting of volume depletion, NSAID use, likely rhabdomyolysis (CK 1533 on admission). Cr still going up, 3.47>4.23>4.72, but UOP doing great with 1875 ml over last 24 hours. Continue with IV Lasix 160 mg Q8H. Does not need CRRT at this time.  2. Metabolic Acidosis: Bicarb initially 10 then improved to 14>18>24 today while on sodium bicarb drip. Continue sodium bicarb drip at 50 ml/hr.  3. Hyperkalemia: K 5.5 yesterday, now 4.3. Continue to monitor.  4. Neck mass, likely lymphoma: Oncology following. Getting bedside FNA of left cervical lymph node today by IR.  5. 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   Subjective:   Sedated on vent. Family at bedside. Tachycardic and hypotensive.    Objective:   BP 91/48 mmHg  Pulse 106  Temp(Src) 97.8 F (36.6 C) (Core (Comment))  Resp 25  Ht 5' 11.5" (1.816 m)  Wt 204 lb 5.9 oz (92.7 kg)  BMI 28.11 kg/m2  SpO2 94%  Intake/Output Summary (Last 24 hours) at 09/18/2015 1221 Last data filed at 09/08/2015 0915  Gross per 24 hour  Intake 3364.64 ml  Output   3500 ml  Net -135.36 ml   Weight change: 8 lb 13.1 oz (4 kg)  Physical Exam: Gen: sedated on vent CVS: tachycardic, regular, +S3 Resp: coarse breath sounds bilaterally Abd: BS+, soft, non-tender Ext: trace-1+ bilateral lower extremity edema   Imaging: Ct Abdomen Pelvis Wo Contrast  09/22/2015   CLINICAL DATA:  Recent diagnosis of lymphoma with acute renal failure and sepsis and back pain  EXAM: CT ABDOMEN AND PELVIS WITHOUT CONTRAST  TECHNIQUE: Multidetector CT imaging of the abdomen and pelvis was performed following the standard protocol without IV contrast.  COMPARISON:  PET-CT from 09/21/2015  FINDINGS: Lung bases again demonstrate a moderate right-sided pleural effusion. Mild infiltrative changes are noted in the lower lobes bilaterally greater on the right than the  left. These changes have increased in the interval from the recent PET-CT.  The liver is homogeneous in attenuation with the exception of a left hepatic cysts. The spleen is within normal limits. The adrenal glands, pancreas and gallbladder are unremarkable. The kidneys demonstrate no obstructive changes. No renal calculi are seen. Mild vascular calcifications are noted in the left kidney.  Diverticular change of the colon is seen. No diverticulitis is noted. A small amount of free pelvic fluid is noted as well as a small amount of fluid surrounding the liver these are similar to that seen on recent PET-CT. The appendix is within normal limits.  The bladder is decompressed by Foley catheter. The osseous structures show no acute abnormality. Degenerative changes are seen. Some mild changes of anasarca are noted.  IMPRESSION: Mild free fluid within the peritoneal cavity similar to that seen on recent PET-CT.  Increase in the degree of bibasilar infiltrate right greater than left with associated right-sided pleural effusion when compared with the prior PET-CT.  Diverticulosis without diverticulitis.   Electronically Signed   By: Inez Catalina M.D.   On: 09/15/2015 21:19   Dg Chest 1 View  09/28/2015   CLINICAL DATA:  Shortness of breath today. Cervical lymphadenopathy. Central line placement. Initial encounter.  EXAM: CHEST 1 VIEW  COMPARISON:  PET-CT 09/21/2015  FINDINGS: 1831 hours. Right IJ central venous catheter tip is at the lower SVC level. No evidence of pneumothorax. Cardiomegaly appears unchanged. There is worsening aeration of the lungs with  probable mild pulmonary edema. There is probable layering of the pleural effusions demonstrated on recent PET-CT. The bones appear unchanged.  IMPRESSION: Central line placement as described, tip at the lower SVC level. No pneumothorax. Cardiomegaly with edema and bilateral pleural effusions.   Electronically Signed   By: Richardean Sale M.D.   On: 09/29/2015 18:38    Mr Thoracic Spine Wo Contrast  09/01/2015   CLINICAL DATA:  Leg heaviness. Back injury 3 weeks ago. Worsening pain today. Unable to urinate. Pain radiates from the back all the way around to the abdomen.  EXAM: MRI THORACIC SPINE WITHOUT CONTRAST; MRI CERVICAL SPINE WITHOUT CONTRAST  TECHNIQUE: Multiplanar and multiecho pulse sequences of the thoracic spine were obtained without intravenous contrast.; Multiplanar and multiecho pulse sequences of the cervical spine, to include the craniocervical junction and cervicothoracic junction, were obtained according to standard protocol without intravenous contrast.  COMPARISON:  PET-CT 09/21/2015.  Soft tissue neck CT 09/08/2015.  FINDINGS: The study was ordered as a metastasis screening protocol of the total spine. However, after sagittal T1 and sagittal STIR images were obtained of the cervical and upper thoracic spine, the examination was discontinued due to decreased oxygen saturation and critical lab values and the patient was returned to the emergency department.  Obtained sagittal images extend from the craniocervical junction to the T9 level. Mild motion artifact is present through the cervical spine.  There is reversal of the normal cervical lordosis. Visualized portion of the thoracic spine demonstrates normal alignment. Mild-to-moderate multilevel disc degeneration is present in the mid and lower cervical spine with predominantly type 2 degenerative endplate marrow changes present, most notably at C6-7. Shallow disc protrusions/disc bulging and endplate spurring are present in the cervical spine, however there is no evidence of significant cervical spinal canal stenosis. A tiny disc protrusion is present at T5-6 without spinal stenosis seen in the visualized portion of the thoracic spine.  The visualized spinal cord is normal in caliber without definite signal abnormality identified, although motion limits evaluation in the cervical spine. There is no cord  compression. Bulky left-sided cervical lymphadenopathy is more fully evaluated on prior neck CT.  IMPRESSION: Incomplete examination as above. Mild-to-moderate cervical disc degeneration. No evidence of significant spinal stenosis or cord compression in the cervical or visualized thoracic spine.   Electronically Signed   By: Logan Bores M.D.   On: 09/12/2015 18:27   Mr Lumbar Spine Wo Contrast  09/28/2015   CLINICAL DATA:  Low back pain for 1 month unable to urinate. Critically ill. Renal failure. Recent diagnosis of lymphoma.  EXAM: MRI LUMBAR SPINE WITHOUT CONTRAST  TECHNIQUE: Multiplanar, multisequence MR imaging of the lumbar spine was performed. No intravenous contrast was administered.  COMPARISON:  CT abdomen and pelvis September 24, 2015  FINDINGS: Low signal to noise ratio.  Lumbar vertebral bodies and posterior elements are intact and aligned and maintenance of lumbar lordosis. Using the reference level of the last well-formed intervertebral disc as L5-S1, moderate to severe L5-S1 disc height loss with moderate to severe subacute discogenic endplate changes. No STIR signal abnormality to suggest acute osseous process. Moderate chronic discogenic endplate changes N5-6. Mild desiccation L4-5 disc.  Conus medullaris terminates at L1-2 and appears normal morphology and signal characteristics. Cauda equina is unremarkable. Bright STIR signal within the paraspinal soft tissues without focal fluid collection.  Level by level evaluation:  L1-2: Small to moderate broad-based disc bulge asymmetric to LEFT. No canal stenosis. Minimal LEFT neural foraminal narrowing.  L2-3: Small broad-based  disc bulge. Mild facet arthropathy and ligamentum flavum redundancy without canal stenosis or neural foraminal narrowing.  L3-4: Small broad-based disc bulge. Mild facet arthropathy and ligamentum flavum redundancy without canal stenosis. Superimposed suspected small LEFT extra foraminal disc protrusion/extrusion. Mild to  moderate RIGHT, moderate LEFT neural foraminal narrowing.  L4-5: Small broad-based disc bulge. Mild facet arthropathy and ligamentum flavum redundancy without canal stenosis. Mild to moderate LEFT neural foraminal narrowing.  L5-S1: Small broad-based disc bulge asymmetric to the RIGHT, superimposed small central disc protrusion and tiny central extrusion component suspected, best characterized on sagittal 8/14, without free fragment. Mild facet arthropathy without canal stenosis. Moderate RIGHT, mild LEFT neural foraminal narrowing.  IMPRESSION: Technically suboptimal examination.  No acute lumbar spine fracture, malalignment or suspicious signal.  Paraspinal muscle edema/denervation.  Degenerative change of the lumbar spine without canal stenosis. L3-4 through L5-S1 neural foraminal narrowing: Moderate on the LEFT at L3-4 and on the RIGHT at L5-S1.  Small suspected contiguous central L5-S1 disc extrusion. Possible small LEFT extra foraminal L3-4 disc protrusion/extrusion could affect the exited LEFT L4 nerve.   Electronically Signed   By: Elon Alas M.D.   On: 09/20/2015 04:04   Dg Chest Port 1 View  09/25/2015   CLINICAL DATA:  Respiratory insufficiency. Evaluate ET tube placement.  EXAM: PORTABLE CHEST 1 VIEW  COMPARISON:  09/25/2015  FINDINGS: There is a right IJ catheter with tip in the cavoatrial junction. ET tube tip is above the carina. There is a nasogastric tube with tip in the stomach. Cardiac enlargement noted. Bilateral pulmonary opacities are again noted compatible with edema and/or multifocal infection.  IMPRESSION: 1. Persistent bilateral pulmonary opacities compatible with pneumonia and/or edema.   Electronically Signed   By: Kerby Moors M.D.   On: 09/25/2015 13:31   Dg Chest Port 1 View  09/25/2015   CLINICAL DATA:  Respiratory failure, sepsis, current smoker.  EXAM: PORTABLE CHEST 1 VIEW  COMPARISON:  Portable chest x-ray of September 24, 2015  FINDINGS: The lungs are adequately  inflated. Confluent alveolar opacities have progressed in the upper lobes and in the right infrahilar region. The cardiopericardial silhouette remains enlarged. The central pulmonary vascularity is prominent. The right internal jugular venous catheter tip projects over the midportion of the SVC. There is no significant pleural effusion. The bony thorax exhibits no acute abnormality.  IMPRESSION: Worsening of alveolar infiltrates bilaterally consistent with pneumonia or pulmonary edema.   Electronically Signed   By: David  Martinique M.D.   On: 09/25/2015 07:36   Mr C Spine Ltd W/o Cm  09/04/2015   CLINICAL DATA:  Leg heaviness. Back injury 3 weeks ago. Worsening pain today. Unable to urinate. Pain radiates from the back all the way around to the abdomen.  EXAM: MRI THORACIC SPINE WITHOUT CONTRAST; MRI CERVICAL SPINE WITHOUT CONTRAST  TECHNIQUE: Multiplanar and multiecho pulse sequences of the thoracic spine were obtained without intravenous contrast.; Multiplanar and multiecho pulse sequences of the cervical spine, to include the craniocervical junction and cervicothoracic junction, were obtained according to standard protocol without intravenous contrast.  COMPARISON:  PET-CT 09/21/2015.  Soft tissue neck CT 09/08/2015.  FINDINGS: The study was ordered as a metastasis screening protocol of the total spine. However, after sagittal T1 and sagittal STIR images were obtained of the cervical and upper thoracic spine, the examination was discontinued due to decreased oxygen saturation and critical lab values and the patient was returned to the emergency department.  Obtained sagittal images extend from the craniocervical junction to the  T9 level. Mild motion artifact is present through the cervical spine.  There is reversal of the normal cervical lordosis. Visualized portion of the thoracic spine demonstrates normal alignment. Mild-to-moderate multilevel disc degeneration is present in the mid and lower cervical spine  with predominantly type 2 degenerative endplate marrow changes present, most notably at C6-7. Shallow disc protrusions/disc bulging and endplate spurring are present in the cervical spine, however there is no evidence of significant cervical spinal canal stenosis. A tiny disc protrusion is present at T5-6 without spinal stenosis seen in the visualized portion of the thoracic spine.  The visualized spinal cord is normal in caliber without definite signal abnormality identified, although motion limits evaluation in the cervical spine. There is no cord compression. Bulky left-sided cervical lymphadenopathy is more fully evaluated on prior neck CT.  IMPRESSION: Incomplete examination as above. Mild-to-moderate cervical disc degeneration. No evidence of significant spinal stenosis or cord compression in the cervical or visualized thoracic spine.   Electronically Signed   By: Logan Bores M.D.   On: 09/17/2015 18:27    Labs: BMET  Recent Labs Lab 09/03/2015 1632 09/08/2015 1656 09/29/2015 1935 09/16/2015 2237 09/25/15 0400 09/25/15 1524 09/07/2015 0540  NA 136 132*  --  137 139 140 139  K 6.4* 6.0*  --  5.8* 6.2* 5.5* 4.3  CL 97* 102  --  103 103 103 100*  CO2 10*  --   --  13* 14* 18* 24  GLUCOSE 65 58*  --  114* 145* 148* 136*  BUN 89* 80*  --  92* 96* 106* 113*  CREATININE 3.43* 3.50*  --  3.19* 3.47* 4.23* 4.72*  CALCIUM 8.5*  --   --  7.6* 7.3* 7.0* 6.0*  PHOS  --   --  11.0*  --  11.1*  --  7.1*   CBC  Recent Labs Lab 09/07/2015 1632 09/17/2015 1656 09/25/15 0400 09/25/15 1524 09/01/2015 0539  WBC 18.6*  --  24.8* 15.0* 14.2*  NEUTROABS 16.5*  --   --  13.7* 13.3*  HGB 11.8* 15.0 10.9* 11.0* 9.9*  HCT 38.9* 44.0 35.9* 34.9* 31.0*  MCV 79.2  --  79.6 78.3 75.4*  PLT 383  --  335 327 258    Medications:    . sodium chloride   Intravenous Once  . sodium chloride   Intravenous Once  . calcium gluconate  1 g Intravenous Once  . dexamethasone (DECADRON) IVPB [facility]  40 mg Intravenous Q24H  .  fentaNYL (SUBLIMAZE) injection  50 mcg Intravenous Once  . furosemide  160 mg Intravenous 3 times per day  . lidocaine (PF)      . piperacillin-tazobactam (ZOSYN)  IV  3.375 g Intravenous Q8H      Albin Felling, MD, MPH Internal Medicine Resident, PGY-II Pager: [phone]   09/28/2015, 12:21 PM

## 2015-09-26 NOTE — H&P (View-Only) (Signed)
Referring Provider: Critical care Primary Care Physician:  No PCP Per Patient Primary Gastroenterologist:  unassigned  Reason for Consultation:    Hematemesis, UGI bleed  HPI: Paul Morton is a 54 y.o. male  who presented to his primary care provider in early September with complaints of low back pain of one month's duration and neck mass of 3 months duration. CT scan of neck revealed soft tissue masses in the supraclavicular left side and deep to the sternocleidomastoid musculature. He was also noted to have microcytic anemia with a hemoglobin of 9.9 and MCV of 79. Patient is noted to have been a smoker and smokes pack a day. He also drinks 6 beers a day per chart history and has been noted to have elevated liver enzymes. He presented to the emergency room on the 9/25 with worsening low back pain of 3 weeks duration that became quite severe yesterday. He reported that his legs felt heavy and he had the urge to urinate but have been unable to urinate. He was found to be in renal failure with lactic acidosis with a CK=1533 and uric acid=11.2. He is also found to have elevated transaminases and elevated INR. He had an echo performed this morning his EF was approximately 10%. Chest x-ray revealed worsening of alveolar infiltrates. Patient required intubation earlier this morning time of intubation was noted to have coffee ground hematemesis. NG tube was placed and patient had approximately 750 mL of dark fluid that drained. Nurse reports that patient had been noting epigastric discomfort with belching and burping on admission.   Past Medical History  Diagnosis Date  . Allergy   . Alcohol abuse 08/2015    admits to 6 pack beer daily.   . Mass of neck 05/2015    multiple soft tissue masses, palpable and seen on CT.  present since at leat 05/2015.   Marland Kitchen Anemia 08/2015    Past Surgical History  Procedure Laterality Date  . Tonsillectomy      Prior to Admission medications   Medication Sig Start  Date End Date Taking? Authorizing Provider  cyclobenzaprine (FLEXERIL) 10 MG tablet Take 1 tablet (10 mg total) by mouth 3 (three) times daily as needed for muscle spasms. 09/08/15  Yes Roselee Culver, MD  EPINEPHrine 0.3 mg/0.3 mL IJ SOAJ injection Inject 0.3 mLs (0.3 mg total) into the muscle once. 09/08/15  Yes Roselee Culver, MD  esomeprazole (NEXIUM) 20 MG capsule Take 20 mg by mouth daily at 12 noon.   Yes Historical Provider, MD  Multiple Vitamin (MULTIVITAMIN WITH MINERALS) TABS tablet Take 1 tablet by mouth daily.   Yes Historical Provider, MD  naproxen sodium (ANAPROX DS) 550 MG tablet Take 1 tablet (550 mg total) by mouth 2 (two) times daily with a meal. 09/08/15 09/07/16 Yes Roselee Culver, MD  predniSONE (DELTASONE) 20 MG tablet Take 3 PO QAM x3days, 2 PO QAM x3days, 1 PO QAM x3days 09/22/15  Yes Tereasa Coop, PA-C  traMADol (ULTRAM) 50 MG tablet Take 1 tablet (50 mg total) by mouth every 8 (eight) hours as needed. Patient taking differently: Take 50 mg by mouth every 8 (eight) hours as needed for moderate pain.  09/08/15  Yes Roselee Culver, MD    Current Facility-Administered Medications  Medication Dose Route Frequency Provider Last Rate Last Dose  . 0.9 %  sodium chloride infusion   Intravenous Continuous Donita Brooks, NP 10 mL/hr at 09/25/15 0949    . 0.9 %  sodium chloride infusion   Intravenous Once Donita Brooks, NP      . albuterol (PROVENTIL) (2.5 MG/3ML) 0.083% nebulizer solution 2.5 mg  2.5 mg Nebulization Q2H PRN Marijean Heath, NP      . bisacodyl (DULCOLAX) suppository 10 mg  10 mg Rectal Daily PRN Donita Brooks, NP      . dexamethasone (DECADRON) 40 mg in sodium chloride 0.9 % 50 mL IVPB  40 mg Intravenous Q24H Volanda Napoleon, MD      . fentaNYL (SUBLIMAZE) 100 MCG/2ML injection           . fentaNYL (SUBLIMAZE) 2,500 mcg in sodium chloride 0.9 % 250 mL (10 mcg/mL) infusion  25-400 mcg/hr Intravenous Continuous Donita Brooks, NP      . fentaNYL  (SUBLIMAZE) bolus via infusion 50 mcg  50 mcg Intravenous Q1H PRN Donita Brooks, NP      . fentaNYL (SUBLIMAZE) injection 50 mcg  50 mcg Intravenous Once Donita Brooks, NP      . fentaNYL (SUBLIMAZE) injection 50-100 mcg  50-100 mcg Intravenous Q1H PRN Anders Simmonds, MD   50 mcg at 09/25/15 1203  . furosemide (LASIX) 160 mg in dextrose 5 % 50 mL IVPB  160 mg Intravenous 3 times per day Fleet Contras, MD   160 mg at 09/25/15 1240  . lidocaine (cardiac) 100 mg/13ml (XYLOCAINE) 20 MG/ML injection 2%           . midazolam (VERSED) injection 2 mg  2 mg Intravenous Q15 min PRN Donita Brooks, NP      . midazolam (VERSED) injection 2 mg  2 mg Intravenous Q2H PRN Donita Brooks, NP      . milrinone (PRIMACOR) 20 MG/100ML (0.2 mg/mL) infusion  0.25 mcg/kg/min Intravenous Continuous Donita Brooks, NP 6.7 mL/hr at 09/25/15 1128 0.25 mcg/kg/min at 09/25/15 1128  . norepinephrine (LEVOPHED) 4 mg in dextrose 5 % 250 mL (0.016 mg/mL) infusion  0-40 mcg/min Intravenous Titrated Donita Brooks, NP 7.5 mL/hr at 09/25/15 1222 2 mcg/min at 09/25/15 1222  . ondansetron (ZOFRAN) injection 4 mg  4 mg Intravenous Q6H PRN Marijean Heath, NP      . pantoprazole (PROTONIX) injection 40 mg  40 mg Intravenous QHS Marijean Heath, NP   40 mg at 09/01/2015 2240  . phytonadione (VITAMIN K) 10 mg in dextrose 5 % 50 mL IVPB  10 mg Intravenous Once Donita Brooks, NP      . piperacillin-tazobactam (ZOSYN) IVPB 3.375 g  3.375 g Intravenous Q8H Emiliano Dyer, RPH   3.375 g at 09/25/15 0400  . sennosides (SENOKOT) 8.8 MG/5ML syrup 5 mL  5 mL Per Tube BID PRN Donita Brooks, NP      . sodium bicarbonate 150 mEq in dextrose 5 % 1,000 mL infusion   Intravenous Continuous Brand Males, MD 50 mL/hr at 09/25/15 0809    . succinylcholine (ANECTINE) 20 MG/ML injection            Facility-Administered Medications Ordered in Other Encounters  Medication Dose Route Frequency Provider Last Rate Last Dose  .  fludeoxyglucose F - 18 (FDG) injection 9.1 milli Curie  9.1 milli Curie Intravenous Once PRN Medication Radiologist, MD   9.1 milli Curie at 09/21/15 2409    Allergies as of 08/31/2015 - Review Complete 09/22/2015  Allergen Reaction Noted  . Peanut-containing drug products Anaphylaxis, Shortness Of Breath, and Nausea And Vomiting 04/20/2014    History reviewed.  No pertinent family history.  Social History   Social History  . Marital Status: Married    Spouse Name: N/A  . Number of Children: N/A  . Years of Education: N/A   Occupational History  . Not on file.   Social History Main Topics  . Smoking status: Current Every Day Smoker -- 1.00 packs/day    Types: Cigarettes  . Smokeless tobacco: Never Used  . Alcohol Use: 0.0 oz/week    0 Standard drinks or equivalent per week  . Drug Use: No  . Sexual Activity: Not Currently   Other Topics Concern  . Not on file   Social History Narrative    Review of Systems: Unable to obtain, patient intubated.  Physical Exam: Vital signs in last 24 hours: Temp:  [95.2 F (35.1 C)-97.7 F (36.5 C)] 97.3 F (36.3 C) (09/26 1100) Pulse Rate:  [102-117] 110 (09/26 1100) Resp:  [8-34] 18 (09/26 1100) BP: (100-143)/(47-92) 114/76 mmHg (09/26 1100) SpO2:  [92 %-100 %] 99 % (09/26 1100) FiO2 (%):  [100 %] 100 % (09/26 1237) Weight:  [195 lb 8.8 oz (88.7 kg)] 195 lb 8.8 oz (88.7 kg) (09/26 0426)   General:   Sedated, intubated Head:  Normocephalic and atraumatic. Eyes:  Sclera icteric  Conjunctiva pink. Ears:  Normal auditory acuity. Nose:  No deformity, discharge,  or lesions. Mouth:  No deformity or lesions.   Neck: cervical adenopathy left side of neck Lungs:  Bibasilar crackles  Heart:  Regular rate and rhythm. Abdomen:  Soft,nontender, BS active,nonpalp mass or hsm.   Rectal:  Deferred  Msk:  Symmetrical without gross deformities. . Pulses:  Normal pulses noted. Extremities: mottling of LE  noted Neurologic:sedated  Intake/Output from previous day: 09/25 0701 - 09/26 0700 In: 2031.3 [I.V.:931.3; IV Piggyback:1100] Out: 150 [Urine:150] Intake/Output this shift: Total I/O In: 106.3 [I.V.:106.3] Out: 75 [Urine:75]  Lab Results:  Recent Labs  09/29/2015 1632 09/05/2015 1656 09/25/15 0400  WBC 18.6*  --  24.8*  HGB 11.8* 15.0 10.9*  HCT 38.9* 44.0 35.9*  PLT 383  --  335   BMET  Recent Labs  09/13/2015 1632 09/05/2015 1656 09/11/2015 2237 09/25/15 0400  NA 136 132* 137 139  K 6.4* 6.0* 5.8* 6.2*  CL 97* 102 103 103  CO2 10*  --  13* 14*  GLUCOSE 65 58* 114* 145*  BUN 89* 80* 92* 96*  CREATININE 3.43* 3.50* 3.19* 3.47*  CALCIUM 8.5*  --  7.6* 7.3*   LFT  Recent Labs  09/25/15 0400  PROT 5.8*  ALBUMIN 2.3*  AST 701*  ALT 345*  ALKPHOS 230*  BILITOT 4.3*  BILIDIR 3.1*  IBILI 1.2*   PT/INR  Recent Labs  09/13/2015 1630 09/25/15 0800  LABPROT 26.6* 27.6*  INR 2.49* 2.61*    Studies/Results: Ct Abdomen Pelvis Wo Contrast  09/22/2015   CLINICAL DATA:  Recent diagnosis of lymphoma with acute renal failure and sepsis and back pain  EXAM: CT ABDOMEN AND PELVIS WITHOUT CONTRAST  TECHNIQUE: Multidetector CT imaging of the abdomen and pelvis was performed following the standard protocol without IV contrast.  COMPARISON:  PET-CT from 09/21/2015  FINDINGS: Lung bases again demonstrate a moderate right-sided pleural effusion. Mild infiltrative changes are noted in the lower lobes bilaterally greater on the right than the left. These changes have increased in the interval from the recent PET-CT.  The liver is homogeneous in attenuation with the exception of a left hepatic cysts. The spleen is within normal limits. The  adrenal glands, pancreas and gallbladder are unremarkable. The kidneys demonstrate no obstructive changes. No renal calculi are seen. Mild vascular calcifications are noted in the left kidney.  Diverticular change of the colon is seen. No diverticulitis is  noted. A small amount of free pelvic fluid is noted as well as a small amount of fluid surrounding the liver these are similar to that seen on recent PET-CT. The appendix is within normal limits.  The bladder is decompressed by Foley catheter. The osseous structures show no acute abnormality. Degenerative changes are seen. Some mild changes of anasarca are noted.  IMPRESSION: Mild free fluid within the peritoneal cavity similar to that seen on recent PET-CT.  Increase in the degree of bibasilar infiltrate right greater than left with associated right-sided pleural effusion when compared with the prior PET-CT.  Diverticulosis without diverticulitis.   Electronically Signed   By: Inez Catalina M.D.   On: 09/21/2015 21:19   Dg Chest 1 View  09/19/2015   CLINICAL DATA:  Shortness of breath today. Cervical lymphadenopathy. Central line placement. Initial encounter.  EXAM: CHEST 1 VIEW  COMPARISON:  PET-CT 09/21/2015  FINDINGS: 1831 hours. Right IJ central venous catheter tip is at the lower SVC level. No evidence of pneumothorax. Cardiomegaly appears unchanged. There is worsening aeration of the lungs with probable mild pulmonary edema. There is probable layering of the pleural effusions demonstrated on recent PET-CT. The bones appear unchanged.  IMPRESSION: Central line placement as described, tip at the lower SVC level. No pneumothorax. Cardiomegaly with edema and bilateral pleural effusions.   Electronically Signed   By: Richardean Sale M.D.   On: 09/08/2015 18:38   Mr Thoracic Spine Wo Contrast  09/19/2015   CLINICAL DATA:  Leg heaviness. Back injury 3 weeks ago. Worsening pain today. Unable to urinate. Pain radiates from the back all the way around to the abdomen.  EXAM: MRI THORACIC SPINE WITHOUT CONTRAST; MRI CERVICAL SPINE WITHOUT CONTRAST  TECHNIQUE: Multiplanar and multiecho pulse sequences of the thoracic spine were obtained without intravenous contrast.; Multiplanar and multiecho pulse sequences of the  cervical spine, to include the craniocervical junction and cervicothoracic junction, were obtained according to standard protocol without intravenous contrast.  COMPARISON:  PET-CT 09/21/2015.  Soft tissue neck CT 09/08/2015.  FINDINGS: The study was ordered as a metastasis screening protocol of the total spine. However, after sagittal T1 and sagittal STIR images were obtained of the cervical and upper thoracic spine, the examination was discontinued due to decreased oxygen saturation and critical lab values and the patient was returned to the emergency department.  Obtained sagittal images extend from the craniocervical junction to the T9 level. Mild motion artifact is present through the cervical spine.  There is reversal of the normal cervical lordosis. Visualized portion of the thoracic spine demonstrates normal alignment. Mild-to-moderate multilevel disc degeneration is present in the mid and lower cervical spine with predominantly type 2 degenerative endplate marrow changes present, most notably at C6-7. Shallow disc protrusions/disc bulging and endplate spurring are present in the cervical spine, however there is no evidence of significant cervical spinal canal stenosis. A tiny disc protrusion is present at T5-6 without spinal stenosis seen in the visualized portion of the thoracic spine.  The visualized spinal cord is normal in caliber without definite signal abnormality identified, although motion limits evaluation in the cervical spine. There is no cord compression. Bulky left-sided cervical lymphadenopathy is more fully evaluated on prior neck CT.  IMPRESSION: Incomplete examination as above. Mild-to-moderate cervical disc  degeneration. No evidence of significant spinal stenosis or cord compression in the cervical or visualized thoracic spine.   Electronically Signed   By: Logan Bores M.D.   On: 09/17/2015 18:27   Dg Chest Port 1 View  09/25/2015   CLINICAL DATA:  Respiratory failure, sepsis, current  smoker.  EXAM: PORTABLE CHEST 1 VIEW  COMPARISON:  Portable chest x-ray of September 24, 2015  FINDINGS: The lungs are adequately inflated. Confluent alveolar opacities have progressed in the upper lobes and in the right infrahilar region. The cardiopericardial silhouette remains enlarged. The central pulmonary vascularity is prominent. The right internal jugular venous catheter tip projects over the midportion of the SVC. There is no significant pleural effusion. The bony thorax exhibits no acute abnormality.  IMPRESSION: Worsening of alveolar infiltrates bilaterally consistent with pneumonia or pulmonary edema.   Electronically Signed   By: David  Martinique M.D.   On: 09/25/2015 07:36   Mr C Spine Ltd W/o Cm  09/05/2015   CLINICAL DATA:  Leg heaviness. Back injury 3 weeks ago. Worsening pain today. Unable to urinate. Pain radiates from the back all the way around to the abdomen.  EXAM: MRI THORACIC SPINE WITHOUT CONTRAST; MRI CERVICAL SPINE WITHOUT CONTRAST  TECHNIQUE: Multiplanar and multiecho pulse sequences of the thoracic spine were obtained without intravenous contrast.; Multiplanar and multiecho pulse sequences of the cervical spine, to include the craniocervical junction and cervicothoracic junction, were obtained according to standard protocol without intravenous contrast.  COMPARISON:  PET-CT 09/21/2015.  Soft tissue neck CT 09/08/2015.  FINDINGS: The study was ordered as a metastasis screening protocol of the total spine. However, after sagittal T1 and sagittal STIR images were obtained of the cervical and upper thoracic spine, the examination was discontinued due to decreased oxygen saturation and critical lab values and the patient was returned to the emergency department.  Obtained sagittal images extend from the craniocervical junction to the T9 level. Mild motion artifact is present through the cervical spine.  There is reversal of the normal cervical lordosis. Visualized portion of the thoracic  spine demonstrates normal alignment. Mild-to-moderate multilevel disc degeneration is present in the mid and lower cervical spine with predominantly type 2 degenerative endplate marrow changes present, most notably at C6-7. Shallow disc protrusions/disc bulging and endplate spurring are present in the cervical spine, however there is no evidence of significant cervical spinal canal stenosis. A tiny disc protrusion is present at T5-6 without spinal stenosis seen in the visualized portion of the thoracic spine.  The visualized spinal cord is normal in caliber without definite signal abnormality identified, although motion limits evaluation in the cervical spine. There is no cord compression. Bulky left-sided cervical lymphadenopathy is more fully evaluated on prior neck CT.  IMPRESSION: Incomplete examination as above. Mild-to-moderate cervical disc degeneration. No evidence of significant spinal stenosis or cord compression in the cervical or visualized thoracic spine.   Electronically Signed   By: Logan Bores M.D.   On: 09/23/2015 18:27    IMPRESSION/PLAN:   54 year old gentleman with lymphoma admitted yesterday with lactic acidosis, AKI, shock, and cardiomyopathy. Renal failure likely multifactorial. Patient to be transferred to [facility] for a CVVHD, as well as for management of his cardiomyopathy on the advanced heart failure service. Patient has been intubated due to acidosis and respiratory distress. Patient was noted to have large volume hematemesis this morning. INR 2.61. Has received FFP and vitamin K. Will keep on PPI infusion. Will tentatively plan on EGD at bedside tomorrow to evaluate for esophagitis,  gastritis, ulcer etc (providing cardiopulmonary status stabilized). Abnormal LFTs likely multifactorial as well but may be exacerbated by possible ischemia. Will follow.   Hvozdovic, Deloris Ping 09/25/2015,  Pager [phone]     Attending physician's note   I have taken a history, examined the  patient and reviewed the chart. I agree with the Advanced Practitioner's note, impression and recommendations. Shock, acidosis, AKI cardiomyopathy and lymphoma. Developed UGI bleeding today in setting of coagulopathy. R/O ulcer, gastritis, MW tear. EGD after coags corrected. Received Vit K and FFP today. IV PPI infusion, NPO, NG suction for now. Elevated LFTs likely due to shock, hepatic congestion. Transferred from [facility] to [facility] today for CVVHD and heart failure service assistance. Monitor coags, CBC. Tentatively scheduled for EGD tomorrow morning in ICU.   Ladene Artist, MD FACG [phone] Mon-Fri 8a-5p [phone] Mon-Fri 5p-8a, weekends, holidays or per [facility]

## 2015-09-26 NOTE — Progress Notes (Signed)
eLink Physician-Brief Progress Note Patient Name: Paul Morton DOB: May 09, 1961 MRN: [id number]   Date of Service  09/25/2015  HPI/Events of Note  Ca low even with correction  eICU Interventions  Ca supp     Intervention Category Intermediate Interventions: Electrolyte abnormality - evaluation and management  Raylene Miyamoto. 09/08/2015, 6:54 AM

## 2015-09-26 NOTE — Progress Notes (Signed)
Lactate 2.2 reported to eMD.

## 2015-09-26 NOTE — Progress Notes (Signed)
PULMONARY / CRITICAL CARE MEDICINE   Name: Paul Morton MRN: [id number] DOB: January 30, 1961    ADMISSION DATE:  09/02/2015  REFERRING MD :  EDP  CHIEF COMPLAINT:  Sepsis   INITIAL PRESENTATION: 54yo male smoker with recent diagnosis lymphoma (still in early stages of workup) who presented 9/25 with progressive back pain.  In ER found to be tachycardic, hypotensive with lactate 14, AKI and coagulopathy.  PCCM called to admit.    STUDIES:  PET 9/22 - Bulky confluent hypermetabolic left cervical lymphadenopathy involving levels II-V. Given the presence of hepatomegaly with diffuse liver hypermetabolism, a diagnosis of lymphoma is favored, however metastatic carcinoma remains on the differential.  Hypermetabolic patchy ground-glass opacity and consolidation in both upper lobes and right lower lobe, favor an infectious or inflammatory etiology.  TTE 9/26 - EF 10-15% w/o regional wall motion abnormality. Grade 3 diastolic dysfunction. Moderate AR. Moderate MR. RV severely dilated w/ moderate reduction in systolic function. PASP 43 mmHg. No pericardial effusion.  RLE Venous Duplex 9/26 - no DVT or SVT  MRI spine 9/25 - incomplete exam, No evidence of significant spinal stenosis or cord compression in the cervical or visualized thoracic spine.  MRI L-spine 9/27 - suboptimal exam. No malignant or suspicious signal.  SIGNIFICANT EVENTS: 9/25 - Admitted to hospital 9/25 - Right IJ CVC by ED 9/26 - Intubated for pending respiratory failure & transferred to [facility] 9/27 - EGD w/ erosive esophagitis 9/27 - FNA neck mass  SUBJECTIVE: Patient underwent endotracheal intubation on 9/26. He was transferred to [facility] from [facility]. He has undergone EGD today with signs of esophagitis as a likely source of bleeding and also bedside ultrasound-guided cervical mass biopsy today.  REVIEW OF SYSTEMS: Unobtainable as the patient is currently intubated and sedated.  VITAL SIGNS: Temp:  [96.3 F (35.7 C)-99 F  (37.2 C)] 97.8 F (36.6 C) (09/27 1200) Pulse Rate:  [105-122] 105 (09/27 1200) Resp:  [6-26] 26 (09/27 1200) BP: (78-108)/(44-63) 83/44 mmHg (09/27 1200) SpO2:  [92 %-100 %] 96 % (09/27 1200) FiO2 (%):  [40 %] 40 % (09/27 1200) Weight:  [204 lb 5.9 oz (92.7 kg)] 204 lb 5.9 oz (92.7 kg) (09/27 0500) HEMODYNAMICS: CVP:  [8 mmHg-14 mmHg] 14 mmHg VENTILATOR SETTINGS: Vent Mode:  [-] PRVC FiO2 (%):  [40 %] 40 % Set Rate:  [26 bmp] 26 bmp Vt Set:  [600 mL] 600 mL PEEP:  [5 cmH20] 5 cmH20 Plateau Pressure:  [18 cmH20-21 cmH20] 21 cmH20 INTAKE / OUTPUT:  Intake/Output Summary (Last 24 hours) at 09/16/2015 1250 Last data filed at 09/20/2015 1200  Gross per 24 hour  Intake 3598.64 ml  Output   2600 ml  Net 998.64 ml    PHYSICAL EXAMINATION: General:  Caucasian male. Sedate. No distress.  Integument:  Warm & dry. No rash on exposed skin.  HEENT:  Endotracheal tube in place. No scleral injection. Left neck mass appreciated. Right IJ CVC in place. Cardiovascular:  Regular rate. No edema.  Pulmonary: Coarse breath sounds bilaterally. Symmetric chest wall rise on ventilator. Abdomen: Soft. Normal bowel sounds. Nondistended.  Neurological: Patient opens eyes to voice. Spontaneously moving all 4 extremities.  LABS:  CBC  Recent Labs Lab 09/25/15 0400 09/25/15 1524 09/04/2015 0539  WBC 24.8* 15.0* 14.2*  HGB 10.9* 11.0* 9.9*  HCT 35.9* 34.9* 31.0*  PLT 335 327 258   Coag's  Recent Labs Lab 09/10/2015 1630  09/25/15 1524 09/13/2015 0539 09/10/2015 1215  APTT 34  --   --  35 31  INR 2.49*  < > 2.75* 2.10* 1.97*  < > = values in this interval not displayed. BMET  Recent Labs Lab 09/25/15 0400 09/25/15 1524 09/17/2015 0540  NA 139 140 139  K 6.2* 5.5* 4.3  CL 103 103 100*  CO2 14* 18* 24  BUN 96* 106* 113*  CREATININE 3.47* 4.23* 4.72*  GLUCOSE 145* 148* 136*   Electrolytes  Recent Labs Lab 09/17/2015 1935  09/25/15 0400 09/25/15 1524 09/07/2015 0539 09/19/2015 0540   CALCIUM  --   < > 7.3* 7.0*  --  6.0*  MG 2.9*  --  2.8*  --  2.5*  --   PHOS 11.0*  --  11.1*  --   --  7.1*  < > = values in this interval not displayed. Sepsis Markers  Recent Labs Lab 09/04/2015 2237 09/25/15 0400 09/25/15 0800 09/14/2015 0541  LATICACIDVEN 8.6* 7.9* 8.4* 2.2*  PROCALCITON 5.88  --   --   --    ABG  Recent Labs Lab 09/25/15 0734 09/25/15 1310 09/25/15 1712  PHART 7.237* 7.162* 7.311*  PCO2ART 25.4* 41.4 34.8*  PO2ART 83.6 352* 78.1*   Liver Enzymes  Recent Labs Lab 09/25/15 0400 09/25/15 1524 09/29/2015 0539 09/22/2015 0540  AST 701* 649* 516*  --   ALT 345* 347* 290*  --   ALKPHOS 230* 215* 173*  --   BILITOT 4.3* 5.1* 5.8*  --   ALBUMIN 2.3* 2.0* 1.9* 1.8*   Cardiac Enzymes No results for input(s): TROPONINI, PROBNP in the last 168 hours. Glucose  Recent Labs Lab 09/25/15 1206 09/25/15 1543 09/25/15 2355 09/13/2015 0402 09/04/2015 0806 09/29/2015 1201  GLUCAP 113* 151* 116* 128* 137* 131*    Imaging Mr Lumbar Spine Wo Contrast  09/05/2015   CLINICAL DATA:  Low back pain for 1 month unable to urinate. Critically ill. Renal failure. Recent diagnosis of lymphoma.  EXAM: MRI LUMBAR SPINE WITHOUT CONTRAST  TECHNIQUE: Multiplanar, multisequence MR imaging of the lumbar spine was performed. No intravenous contrast was administered.  COMPARISON:  CT abdomen and pelvis September 24, 2015  FINDINGS: Low signal to noise ratio.  Lumbar vertebral bodies and posterior elements are intact and aligned and maintenance of lumbar lordosis. Using the reference level of the last well-formed intervertebral disc as L5-S1, moderate to severe L5-S1 disc height loss with moderate to severe subacute discogenic endplate changes. No STIR signal abnormality to suggest acute osseous process. Moderate chronic discogenic endplate changes X1-0. Mild desiccation L4-5 disc.  Conus medullaris terminates at L1-2 and appears normal morphology and signal characteristics. Cauda equina is  unremarkable. Bright STIR signal within the paraspinal soft tissues without focal fluid collection.  Level by level evaluation:  L1-2: Small to moderate broad-based disc bulge asymmetric to LEFT. No canal stenosis. Minimal LEFT neural foraminal narrowing.  L2-3: Small broad-based disc bulge. Mild facet arthropathy and ligamentum flavum redundancy without canal stenosis or neural foraminal narrowing.  L3-4: Small broad-based disc bulge. Mild facet arthropathy and ligamentum flavum redundancy without canal stenosis. Superimposed suspected small LEFT extra foraminal disc protrusion/extrusion. Mild to moderate RIGHT, moderate LEFT neural foraminal narrowing.  L4-5: Small broad-based disc bulge. Mild facet arthropathy and ligamentum flavum redundancy without canal stenosis. Mild to moderate LEFT neural foraminal narrowing.  L5-S1: Small broad-based disc bulge asymmetric to the RIGHT, superimposed small central disc protrusion and tiny central extrusion component suspected, best characterized on sagittal 8/14, without free fragment. Mild facet arthropathy without canal stenosis. Moderate RIGHT, mild LEFT neural foraminal narrowing.  IMPRESSION: Technically suboptimal examination.  No acute lumbar spine fracture, malalignment or suspicious signal.  Paraspinal muscle edema/denervation.  Degenerative change of the lumbar spine without canal stenosis. L3-4 through L5-S1 neural foraminal narrowing: Moderate on the LEFT at L3-4 and on the RIGHT at L5-S1.  Small suspected contiguous central L5-S1 disc extrusion. Possible small LEFT extra foraminal L3-4 disc protrusion/extrusion could affect the exited LEFT L4 nerve.   Electronically Signed   By: Elon Alas M.D.   On: 09/27/2015 04:04   Dg Chest Port 1 View  09/25/2015   CLINICAL DATA:  Respiratory insufficiency. Evaluate ET tube placement.  EXAM: PORTABLE CHEST 1 VIEW  COMPARISON:  09/25/2015  FINDINGS: There is a right IJ catheter with tip in the cavoatrial junction.  ET tube tip is above the carina. There is a nasogastric tube with tip in the stomach. Cardiac enlargement noted. Bilateral pulmonary opacities are again noted compatible with edema and/or multifocal infection.  IMPRESSION: 1. Persistent bilateral pulmonary opacities compatible with pneumonia and/or edema.   Electronically Signed   By: Kerby Moors M.D.   On: 09/25/2015 13:31     ASSESSMENT / PLAN:  PULMONARY OEET 9/26>>> A: Acute hypoxic respiratory failure  Possible CAP H/O Tobacco Use  P:   Vent Bundle  Duonebs q6hr Holding off on SBT  CARDIOVASCULAR CVL R IJ CVL (EDP) 9/25>>> A: Nonischemic Cardiomyopathy Shock - Sepsis versus Cardiac in etiology  P:  Cardiology/Heart Failure Team consulted Milrinone gtt Levophed gtt to maintain MAP >65  RENAL A: Acute Renal Failure - Worsening function but improved output on Lasix. Hyperkalemia - resolved. Hyponatremia - resolved  Metabolic Acidosis - improving on bicarb gtt Lactic acidosis - improving  P:   Nephrology consulted Further Lasix per nephrology Trending Lactic Acid Monitoring UOP with Foley catheter Trending daily electrolytes  GASTROINTESTINAL A: Shock liver - improving Upper GIB - secondary to erosive esophagitis on EGD  P:   Trending LFTs daily NPO for now  Protonix gtt for 3 days (9/29) then bid Protonix GI consulted - s/p EGD Hepatitis A/B/C negative on serum  HEMATOLOGIC Coagulopathy - Unclear etiology. S/P Vitamin K & 4u FFP. Leukocytosis - improving Anemia - Secondary to upper GIB Possible Lymphoma - FNA/core biopsy 9/27 at bedside.  P:  Dr. Marin Olp following Trending Hgb w/ CBC daily Trending Coagulopathy SCDs Decadron 40mg  IV q24hr  INFECTIOUS A: Sepsis - Possible CAP  P:   Respiratory culture today  Resp ctx 9/27>>> BCx2 9/25>>> UC 9/25>>>negative  Vancomycin 9/25>>> Zosyn 9/25>>>  ENDOCRINE A: Hypoglycemia   P:   CBG q4 hr w/ parameters to notify  MD  NEUROLOGIC A: Altered Mental Status - Toxic Metabolic Encephalopathy versus Dilaudid induced  Back pain - unclear etiology.  P:   MRI spine negative Fentanyl for pain relief Versed IV prn for RASS goal:  0 to -1  FAMILY  - Updates:  Wife updated at bedside 9/27 by me.  TODAY'S SUMMARY: 54 year old male with left neck mass and concern for lymphoma. Intubated on 9/26 for worsening hypoxic respiratory failure. The patient has developed a vasopressor requirement for Levophed overnight. Continuing on a Primacor infusion for his underlying cardiomyopathy. Found to have esophagitis on EGD today and continuing recommendations per GI. Awaiting results from bedside FNA today. I'm encouraged that the patient's transaminitis is improving today and suspect his hyperbilirubinemia is simply lagging behind. Holding off on placement of central venous hemodialysis catheter until nephrology feels dialysis is necessary.  I have spent a total of 37  minutes of critical care time today caring for the patient, updating the patient's wife at bedside, & reviewing the patient's electronic medical record.  Sonia Baller Ashok Cordia, M.D. [facility] Pulmonary & Critical Care Pager:  [phone] After 3pm or if no response, call [phone]  09/13/2015  12:50 PM

## 2015-09-26 NOTE — Progress Notes (Signed)
Daily Rounding Note  09/22/2015, 8:28 AM  LOS: 2 days   SUBJECTIVE:       Remains intubated/sedated on multiple drips including Levophed, Protonix, Fentanyl, Bicarb, milrinone.   OBJECTIVE:         Vital signs in last 24 hours:    Temp:  [96.3 F (35.7 C)-99 F (37.2 C)] 97.4 F (36.3 C) (09/27 0818) Pulse Rate:  [105-122] 106 (09/27 0818) Resp:  [6-26] 26 (09/27 0818) BP: (89-123)/(44-76) 97/53 mmHg (09/27 0818) SpO2:  [92 %-100 %] 99 % (09/27 0818) FiO2 (%):  [40 %-100 %] 40 % (09/27 0818) Weight:  [204 lb 5.9 oz (92.7 kg)] 204 lb 5.9 oz (92.7 kg) (09/27 0500)   Filed Weights   09/01/2015 2100 09/25/15 0426 09/07/2015 0500  Weight: 195 lb 8.8 oz (88.7 kg) 195 lb 8.8 oz (88.7 kg) 204 lb 5.9 oz (92.7 kg)   General: awakens to voice.    ENT:  Cg material in NGT.    Heart: tachy, regular Chest: cler in front, on vent Abdomen: soft, NT, obese/slight protuberance  Extremities: no CCE.  Feet warm and well perfused Neuro/Psych:  Restraint mittens in place.  No tremor.  Anxious but not physically agitated.   Intake/Output from previous day: 09/26 0701 - 09/27 0700 In: 3452.4 [I.V.:2606.4; Blood:494; IV Piggyback:352] Out: 9675 [FFMBW:4665; Emesis/NG output:800]  Intake/Output this shift:    Lab Results:  Recent Labs  09/25/15 0400 09/25/15 1524 09/09/2015 0539  WBC 24.8* 15.0* 14.2*  HGB 10.9* 11.0* 9.9*  HCT 35.9* 34.9* 31.0*  PLT 335 327 258   BMET  Recent Labs  09/25/15 0400 09/25/15 1524 09/10/2015 0540  NA 139 140 139  K 6.2* 5.5* 4.3  CL 103 103 100*  CO2 14* 18* 24  GLUCOSE 145* 148* 136*  BUN 96* 106* 113*  CREATININE 3.47* 4.23* 4.72*  CALCIUM 7.3* 7.0* 6.0*   LFT  Recent Labs  09/25/15 0400 09/25/15 1524 09/05/2015 0539 09/15/2015 0540  PROT 5.8* 5.2* 5.2*  --   ALBUMIN 2.3* 2.0* 1.9* 1.8*  AST 701* 649* 516*  --   ALT 345* 347* 290*  --   ALKPHOS 230* 215* 173*  --   BILITOT 4.3*  5.1* 5.8*  --   BILIDIR 3.1*  --  4.0*  --   IBILI 1.2*  --  1.8*  --    PT/INR  Recent Labs  09/25/15 1524 09/21/2015 0539  LABPROT 28.6* 23.4*  INR 2.75* 2.10*   Hepatitis Panel  Recent Labs  09/25/15 1521 09/25/15 1524  HEPBSAG Negative  --   HCVAB  --  <0.1  HEPAIGM  --  Negative    Studies/Results: Ct Abdomen Pelvis Wo Contrast  09/07/2015   CLINICAL DATA:  Recent diagnosis of lymphoma with acute renal failure and sepsis and back pain  EXAM: CT ABDOMEN AND PELVIS WITHOUT CONTRAST  TECHNIQUE: Multidetector CT imaging of the abdomen and pelvis was performed following the standard protocol without IV contrast.  COMPARISON:  PET-CT from 09/21/2015  FINDINGS: Lung bases again demonstrate a moderate right-sided pleural effusion. Mild infiltrative changes are noted in the lower lobes bilaterally greater on the right than the left. These changes have increased in the interval from the recent PET-CT.  The liver is homogeneous in attenuation with the exception of a left hepatic cysts. The spleen is within normal limits. The adrenal glands, pancreas and gallbladder are unremarkable. The kidneys demonstrate no obstructive changes. No renal  calculi are seen. Mild vascular calcifications are noted in the left kidney.  Diverticular change of the colon is seen. No diverticulitis is noted. A small amount of free pelvic fluid is noted as well as a small amount of fluid surrounding the liver these are similar to that seen on recent PET-CT. The appendix is within normal limits.  The bladder is decompressed by Foley catheter. The osseous structures show no acute abnormality. Degenerative changes are seen. Some mild changes of anasarca are noted.  IMPRESSION: Mild free fluid within the peritoneal cavity similar to that seen on recent PET-CT.  Increase in the degree of bibasilar infiltrate right greater than left with associated right-sided pleural effusion when compared with the prior PET-CT.  Diverticulosis  without diverticulitis.   Electronically Signed   By: Inez Catalina M.D.   On: 09/02/2015 21:19   Dg Chest 1 View  09/27/2015   CLINICAL DATA:  Shortness of breath today. Cervical lymphadenopathy. Central line placement. Initial encounter.  EXAM: CHEST 1 VIEW  COMPARISON:  PET-CT 09/21/2015  FINDINGS: 1831 hours. Right IJ central venous catheter tip is at the lower SVC level. No evidence of pneumothorax. Cardiomegaly appears unchanged. There is worsening aeration of the lungs with probable mild pulmonary edema. There is probable layering of the pleural effusions demonstrated on recent PET-CT. The bones appear unchanged.  IMPRESSION: Central line placement as described, tip at the lower SVC level. No pneumothorax. Cardiomegaly with edema and bilateral pleural effusions.   Electronically Signed   By: Richardean Sale M.D.   On: 09/20/2015 18:38   Mr Thoracic Spine Wo Contrast  09/18/2015   CLINICAL DATA:  Leg heaviness. Back injury 3 weeks ago. Worsening pain today. Unable to urinate. Pain radiates from the back all the way around to the abdomen.  EXAM: MRI THORACIC SPINE WITHOUT CONTRAST; MRI CERVICAL SPINE WITHOUT CONTRAST  TECHNIQUE: Multiplanar and multiecho pulse sequences of the thoracic spine were obtained without intravenous contrast.; Multiplanar and multiecho pulse sequences of the cervical spine, to include the craniocervical junction and cervicothoracic junction, were obtained according to standard protocol without intravenous contrast.  COMPARISON:  PET-CT 09/21/2015.  Soft tissue neck CT 09/08/2015.  FINDINGS: The study was ordered as a metastasis screening protocol of the total spine. However, after sagittal T1 and sagittal STIR images were obtained of the cervical and upper thoracic spine, the examination was discontinued due to decreased oxygen saturation and critical lab values and the patient was returned to the emergency department.  Obtained sagittal images extend from the craniocervical  junction to the T9 level. Mild motion artifact is present through the cervical spine.  There is reversal of the normal cervical lordosis. Visualized portion of the thoracic spine demonstrates normal alignment. Mild-to-moderate multilevel disc degeneration is present in the mid and lower cervical spine with predominantly type 2 degenerative endplate marrow changes present, most notably at C6-7. Shallow disc protrusions/disc bulging and endplate spurring are present in the cervical spine, however there is no evidence of significant cervical spinal canal stenosis. A tiny disc protrusion is present at T5-6 without spinal stenosis seen in the visualized portion of the thoracic spine.  The visualized spinal cord is normal in caliber without definite signal abnormality identified, although motion limits evaluation in the cervical spine. There is no cord compression. Bulky left-sided cervical lymphadenopathy is more fully evaluated on prior neck CT.  IMPRESSION: Incomplete examination as above. Mild-to-moderate cervical disc degeneration. No evidence of significant spinal stenosis or cord compression in the cervical or visualized  thoracic spine.   Electronically Signed   By: Logan Bores M.D.   On: 09/12/2015 18:27   Mr Lumbar Spine Wo Contrast  09/18/2015   CLINICAL DATA:  Low back pain for 1 month unable to urinate. Critically ill. Renal failure. Recent diagnosis of lymphoma.  EXAM: MRI LUMBAR SPINE WITHOUT CONTRAST  TECHNIQUE: Multiplanar, multisequence MR imaging of the lumbar spine was performed. No intravenous contrast was administered.  COMPARISON:  CT abdomen and pelvis September 24, 2015  FINDINGS: Low signal to noise ratio.  Lumbar vertebral bodies and posterior elements are intact and aligned and maintenance of lumbar lordosis. Using the reference level of the last well-formed intervertebral disc as L5-S1, moderate to severe L5-S1 disc height loss with moderate to severe subacute discogenic endplate changes.  No STIR signal abnormality to suggest acute osseous process. Moderate chronic discogenic endplate changes L2-7. Mild desiccation L4-5 disc.  Conus medullaris terminates at L1-2 and appears normal morphology and signal characteristics. Cauda equina is unremarkable. Bright STIR signal within the paraspinal soft tissues without focal fluid collection.  Level by level evaluation:  L1-2: Small to moderate broad-based disc bulge asymmetric to LEFT. No canal stenosis. Minimal LEFT neural foraminal narrowing.  L2-3: Small broad-based disc bulge. Mild facet arthropathy and ligamentum flavum redundancy without canal stenosis or neural foraminal narrowing.  L3-4: Small broad-based disc bulge. Mild facet arthropathy and ligamentum flavum redundancy without canal stenosis. Superimposed suspected small LEFT extra foraminal disc protrusion/extrusion. Mild to moderate RIGHT, moderate LEFT neural foraminal narrowing.  L4-5: Small broad-based disc bulge. Mild facet arthropathy and ligamentum flavum redundancy without canal stenosis. Mild to moderate LEFT neural foraminal narrowing.  L5-S1: Small broad-based disc bulge asymmetric to the RIGHT, superimposed small central disc protrusion and tiny central extrusion component suspected, best characterized on sagittal 8/14, without free fragment. Mild facet arthropathy without canal stenosis. Moderate RIGHT, mild LEFT neural foraminal narrowing.  IMPRESSION: Technically suboptimal examination.  No acute lumbar spine fracture, malalignment or suspicious signal.  Paraspinal muscle edema/denervation.  Degenerative change of the lumbar spine without canal stenosis. L3-4 through L5-S1 neural foraminal narrowing: Moderate on the LEFT at L3-4 and on the RIGHT at L5-S1.  Small suspected contiguous central L5-S1 disc extrusion. Possible small LEFT extra foraminal L3-4 disc protrusion/extrusion could affect the exited LEFT L4 nerve.   Electronically Signed   By: Elon Alas M.D.   On:  09/28/2015 04:04   Dg Chest Port 1 View  09/25/2015   CLINICAL DATA:  Respiratory insufficiency. Evaluate ET tube placement.  EXAM: PORTABLE CHEST 1 VIEW  COMPARISON:  09/25/2015  FINDINGS: There is a right IJ catheter with tip in the cavoatrial junction. ET tube tip is above the carina. There is a nasogastric tube with tip in the stomach. Cardiac enlargement noted. Bilateral pulmonary opacities are again noted compatible with edema and/or multifocal infection.  IMPRESSION: 1. Persistent bilateral pulmonary opacities compatible with pneumonia and/or edema.   Electronically Signed   By: Kerby Moors M.D.   On: 09/25/2015 13:31   Dg Chest Port 1 View  09/25/2015   CLINICAL DATA:  Respiratory failure, sepsis, current smoker.  EXAM: PORTABLE CHEST 1 VIEW  COMPARISON:  Portable chest x-ray of September 24, 2015  FINDINGS: The lungs are adequately inflated. Confluent alveolar opacities have progressed in the upper lobes and in the right infrahilar region. The cardiopericardial silhouette remains enlarged. The central pulmonary vascularity is prominent. The right internal jugular venous catheter tip projects over the midportion of the SVC. There is no  significant pleural effusion. The bony thorax exhibits no acute abnormality.  IMPRESSION: Worsening of alveolar infiltrates bilaterally consistent with pneumonia or pulmonary edema.   Electronically Signed   By: David  Martinique M.D.   On: 09/25/2015 07:36   Mr C Spine Ltd W/o Cm  09/12/2015   CLINICAL DATA:  Leg heaviness. Back injury 3 weeks ago. Worsening pain today. Unable to urinate. Pain radiates from the back all the way around to the abdomen.  EXAM: MRI THORACIC SPINE WITHOUT CONTRAST; MRI CERVICAL SPINE WITHOUT CONTRAST  TECHNIQUE: Multiplanar and multiecho pulse sequences of the thoracic spine were obtained without intravenous contrast.; Multiplanar and multiecho pulse sequences of the cervical spine, to include the craniocervical junction and  cervicothoracic junction, were obtained according to standard protocol without intravenous contrast.  COMPARISON:  PET-CT 09/21/2015.  Soft tissue neck CT 09/08/2015.  FINDINGS: The study was ordered as a metastasis screening protocol of the total spine. However, after sagittal T1 and sagittal STIR images were obtained of the cervical and upper thoracic spine, the examination was discontinued due to decreased oxygen saturation and critical lab values and the patient was returned to the emergency department.  Obtained sagittal images extend from the craniocervical junction to the T9 level. Mild motion artifact is present through the cervical spine.  There is reversal of the normal cervical lordosis. Visualized portion of the thoracic spine demonstrates normal alignment. Mild-to-moderate multilevel disc degeneration is present in the mid and lower cervical spine with predominantly type 2 degenerative endplate marrow changes present, most notably at C6-7. Shallow disc protrusions/disc bulging and endplate spurring are present in the cervical spine, however there is no evidence of significant cervical spinal canal stenosis. A tiny disc protrusion is present at T5-6 without spinal stenosis seen in the visualized portion of the thoracic spine.  The visualized spinal cord is normal in caliber without definite signal abnormality identified, although motion limits evaluation in the cervical spine. There is no cord compression. Bulky left-sided cervical lymphadenopathy is more fully evaluated on prior neck CT.  IMPRESSION: Incomplete examination as above. Mild-to-moderate cervical disc degeneration. No evidence of significant spinal stenosis or cord compression in the cervical or visualized thoracic spine.   Electronically Signed   By: Logan Bores M.D.   On: 09/08/2015 18:27    ASSESMENT:   *  CG emesis. On PPI drip.    *  Elevated LFTs.  Transaminases improving.  Liver homogeneous on CT 9/25. Reported hx of drinking  6 pack beer per day.    *  Coagulopathy. S/p 10 mg IV Vit K,  2 FFP yesterday, s more ordered this AM.   *  ABL anemia. Microcytic.  Low iron level 2 weeks ago.   *  resp failure.  Intubated 9/26  *  AKI.    *  Cardiomyopathy, EF 10 to 15%.    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  No biopsy yet.   *  Sepsis.    *  LE weakness and back pain.  imcomplete MRI shows degenerative changes and disc protrusion at lumbar and cervical spine.   *  Hypoalbuminemia.    PLAN   *  2 more of FFP to be transfused this AM.  Should be completed by 0930  *  EGD set for 10 AM at bedside.     Azucena Freed  09/19/2015, 8:28 AM Pager: [phone]     Attending physician's note   I have taken an interval history, reviewed the chart and examined the  patient. I agree with the Advanced Practitioner's note, impression and recommendations. He remains critically ill, intubated and now on Levophed. UGI bleeding has decreased. INR remains elevated at 2.1 today. EGD this morning with FFP given just prior to EGD. LFTs have improved. Continue to monitor.     Pricilla Riffle. Fuller Plan, MD [facility] [phone] Mon-Fri 8a-5p [phone] Mon-Fri 5p-8a, weekends, holidays or per [facility]

## 2015-09-26 NOTE — Progress Notes (Signed)
Paul Morton status continues to deteriorate. He now is over in the ICU [facility]. He is intubated. He had a large GI bleed.  He was tried for a biopsy yesterday but his INR was a little on the high side. Hopefully, he will have the biopsy today. I think his BUN is so high because of the GI bleeding. His creatinine is also going up.  His lactic acid has come down.  His INR is still 2.1. I am not sure how much better this is going to get. I think he has intrinsic liver issues. His bilirubin is up now.  Apparently, the nurse told her that there is a history of heavy alcohol use. I suppose that he may have some underlying cirrhosis. He may have some variceal bleeding. However, I still worry that his underlying malignancy is still a huge problem.  I think the only way to improve the INR is with FFP. Again I would think that with a core biopsy, the risk of bleeding should be very low.  Because of his renal insufficiency, I'm sure he also has some platelet dysfunction.  I'm just amazed as to how quickly his condition has deteriorated. Pap I have no clue as to why his echocardiogram showed an ejection fraction of 10%. Whether he has some kind of alcoholic cardiomyopathy or not I suppose that would be possible. I might sure how much he was drinking.  He is making urine.  He is on pressor support. Provided him on some high-dose Decadron at 40 mg a day for 4 days. If this is a lymphoproliferative process, then I think the Decadron might help a little bit.  I really don't have much else that right now. Again, this will boil down to getting a tissue diagnosis. Hopefully, radiology will be oh to do this today.  He is going to have a bedside EGD today.  We will just have to monitor his labs and help out as needed.  I just feel so bad for him. I just saw him about 2 or 3 weeks ago and he really looked good.  Paul Morton 1:5-7

## 2015-09-27 ENCOUNTER — Encounter (HOSPITAL_COMMUNITY): Payer: Self-pay | Admitting: Physician Assistant

## 2015-09-27 ENCOUNTER — Inpatient Hospital Stay (HOSPITAL_COMMUNITY): Payer: BLUE CROSS/BLUE SHIELD

## 2015-09-27 DIAGNOSIS — N171 Acute kidney failure with acute cortical necrosis: Secondary | ICD-10-CM | POA: Insufficient documentation

## 2015-09-27 LAB — CARBOXYHEMOGLOBIN
Carboxyhemoglobin: 2.2 % — ABNORMAL HIGH (ref 0.5–1.5)
Methemoglobin: 0.8 % (ref 0.0–1.5)
O2 SAT: 76 %
TOTAL HEMOGLOBIN: 9.7 g/dL — AB (ref 13.5–18.0)

## 2015-09-27 LAB — PREPARE FRESH FROZEN PLASMA
UNIT DIVISION: 0
Unit division: 0

## 2015-09-27 LAB — URIC ACID: Uric Acid, Serum: <0.5 mg/dL — ABNORMAL LOW (ref 4.4–7.6)

## 2015-09-27 LAB — APTT: APTT: 30 s (ref 24–37)

## 2015-09-27 LAB — HEPATIC FUNCTION PANEL
ALK PHOS: 169 U/L — AB (ref 38–126)
ALT: 226 U/L — ABNORMAL HIGH (ref 17–63)
AST: 251 U/L — ABNORMAL HIGH (ref 15–41)
Albumin: 2 g/dL — ABNORMAL LOW (ref 3.5–5.0)
Bilirubin, Direct: 5 mg/dL — ABNORMAL HIGH (ref 0.1–0.5)
Indirect Bilirubin: 2.1 mg/dL — ABNORMAL HIGH (ref 0.3–0.9)
TOTAL PROTEIN: 5.4 g/dL — AB (ref 6.5–8.1)
Total Bilirubin: 7.1 mg/dL — ABNORMAL HIGH (ref 0.3–1.2)

## 2015-09-27 LAB — FIBRINOGEN: FIBRINOGEN: 333 mg/dL (ref 204–475)

## 2015-09-27 LAB — CBC WITH DIFFERENTIAL/PLATELET
BASOS ABS: 0 10*3/uL (ref 0.0–0.1)
BASOS PCT: 0 %
EOS PCT: 0 %
Eosinophils Absolute: 0 10*3/uL (ref 0.0–0.7)
HCT: 30.3 % — ABNORMAL LOW (ref 39.0–52.0)
Hemoglobin: 9.7 g/dL — ABNORMAL LOW (ref 13.0–17.0)
Lymphocytes Relative: 1 %
Lymphs Abs: 0.3 10*3/uL — ABNORMAL LOW (ref 0.7–4.0)
MCH: 23.9 pg — ABNORMAL LOW (ref 26.0–34.0)
MCHC: 32 g/dL (ref 30.0–36.0)
MCV: 74.6 fL — AB (ref 78.0–100.0)
MONO ABS: 0.8 10*3/uL (ref 0.1–1.0)
Monocytes Relative: 4 %
Neutro Abs: 21.9 10*3/uL — ABNORMAL HIGH (ref 1.7–7.7)
Neutrophils Relative %: 95 %
PLATELETS: 192 10*3/uL (ref 150–400)
RBC: 4.06 MIL/uL — ABNORMAL LOW (ref 4.22–5.81)
RDW: 17.6 % — AB (ref 11.5–15.5)
WBC: 23 10*3/uL — ABNORMAL HIGH (ref 4.0–10.5)

## 2015-09-27 LAB — BASIC METABOLIC PANEL
ANION GAP: 17 — AB (ref 5–15)
BUN: 120 mg/dL — ABNORMAL HIGH (ref 6–20)
CHLORIDE: 96 mmol/L — AB (ref 101–111)
CO2: 27 mmol/L (ref 22–32)
Calcium: 5.9 mg/dL — CL (ref 8.9–10.3)
Creatinine, Ser: 4.94 mg/dL — ABNORMAL HIGH (ref 0.61–1.24)
GFR calc Af Amer: 14 mL/min — ABNORMAL LOW (ref >60)
GFR calc non Af Amer: 12 mL/min — ABNORMAL LOW (ref >60)
GLUCOSE: 174 mg/dL — AB (ref 65–99)
POTASSIUM: 3.6 mmol/L (ref 3.5–5.1)
Sodium: 140 mmol/L (ref 135–145)

## 2015-09-27 LAB — GLUCOSE, CAPILLARY
GLUCOSE-CAPILLARY: 160 mg/dL — AB (ref 65–99)
GLUCOSE-CAPILLARY: 148 mg/dL — AB (ref 65–99)
Glucose-Capillary: 145 mg/dL — ABNORMAL HIGH (ref 65–99)
Glucose-Capillary: 148 mg/dL — ABNORMAL HIGH (ref 65–99)
Glucose-Capillary: 151 mg/dL — ABNORMAL HIGH (ref 65–99)
Glucose-Capillary: 152 mg/dL — ABNORMAL HIGH (ref 65–99)
Glucose-Capillary: 158 mg/dL — ABNORMAL HIGH (ref 65–99)

## 2015-09-27 LAB — RENAL FUNCTION PANEL
ALBUMIN: 2 g/dL — AB (ref 3.5–5.0)
Anion gap: 16 — ABNORMAL HIGH (ref 5–15)
BUN: 118 mg/dL — AB (ref 6–20)
CHLORIDE: 95 mmol/L — AB (ref 101–111)
CO2: 28 mmol/L (ref 22–32)
CREATININE: 4.95 mg/dL — AB (ref 0.61–1.24)
Calcium: 5.8 mg/dL — CL (ref 8.9–10.3)
GFR calc Af Amer: 14 mL/min — ABNORMAL LOW (ref >60)
GFR, EST NON AFRICAN AMERICAN: 12 mL/min — AB (ref >60)
Glucose, Bld: 161 mg/dL — ABNORMAL HIGH (ref 65–99)
Phosphorus: 6.3 mg/dL — ABNORMAL HIGH (ref 2.5–4.6)
Potassium: 3.9 mmol/L (ref 3.5–5.1)
Sodium: 139 mmol/L (ref 135–145)

## 2015-09-27 LAB — PHOSPHORUS: Phosphorus: 6.2 mg/dL — ABNORMAL HIGH (ref 2.5–4.6)

## 2015-09-27 LAB — LACTIC ACID, PLASMA: Lactic Acid, Venous: 1.4 mmol/L (ref 0.5–2.0)

## 2015-09-27 LAB — MAGNESIUM
MAGNESIUM: 2.5 mg/dL — AB (ref 1.7–2.4)
MAGNESIUM: 2.5 mg/dL — AB (ref 1.7–2.4)

## 2015-09-27 LAB — PROTIME-INR
INR: 1.73 — AB (ref 0.00–1.49)
PROTHROMBIN TIME: 20.2 s — AB (ref 11.6–15.2)

## 2015-09-27 LAB — TROPONIN I: TROPONIN I: 0.09 ng/mL — AB (ref <0.031)

## 2015-09-27 MED ORDER — CALCIUM GLUCONATE 10 % IV SOLN
1.0000 g | Freq: Once | INTRAVENOUS | Status: AC
  Administered 2015-09-27: 1 g via INTRAVENOUS
  Filled 2015-09-27: qty 10

## 2015-09-27 MED ORDER — ANTISEPTIC ORAL RINSE SOLUTION (CORINZ)
7.0000 mL | Freq: Four times a day (QID) | OROMUCOSAL | Status: DC
  Administered 2015-09-28 – 2015-09-29 (×6): 7 mL via OROMUCOSAL

## 2015-09-27 MED ORDER — POTASSIUM CHLORIDE 10 MEQ/50ML IV SOLN
10.0000 meq | INTRAVENOUS | Status: AC
  Administered 2015-09-27 (×2): 10 meq via INTRAVENOUS
  Filled 2015-09-27 (×2): qty 50

## 2015-09-27 MED ORDER — CHLORHEXIDINE GLUCONATE 0.12% ORAL RINSE (MEDLINE KIT)
15.0000 mL | Freq: Two times a day (BID) | OROMUCOSAL | Status: DC
  Administered 2015-09-27 – 2015-09-29 (×4): 15 mL via OROMUCOSAL

## 2015-09-27 MED ORDER — PANTOPRAZOLE SODIUM 40 MG IV SOLR: 40.0000 mg | Freq: Two times a day (BID) | INTRAVENOUS | Status: DC

## 2015-09-27 NOTE — Progress Notes (Signed)
ANTIBIOTIC CONSULT NOTE - FOLLOW UP  Pharmacy Consult for Zosyn Indication: CAP   Allergies  Allergen Reactions  . Peanut-Containing Drug Products Anaphylaxis, Shortness Of Breath and Nausea And Vomiting    Patient Measurements: Height: 5' 11.5" (181.6 cm) Weight: 202 lb 13.2 oz (92 kg) IBW/kg (Calculated) : 76.45 As of 09/22/15: Height: 72 inches Weight: 87.5 kg  Vital Signs: Temp: 99.5 F (37.5 C) (09/28 0800) Temp Source: Core (Comment) (09/28 0400) BP: 110/54 mmHg (09/28 0819) Pulse Rate: 119 (09/28 0819) Intake/Output from previous day: 09/27 0701 - 09/28 0700 In: 4448.2 [I.V.:3198.8; Blood:650; IV Piggyback:599.4] Out: 4400 [Urine:3100; Emesis/NG output:1300] Intake/Output from this shift: Total I/O In: 131.7 [I.V.:131.7] Out: 550 [Urine:550]  Labs:  Recent Labs  09/25/15 1524 09/27/2015 0539 09/22/2015 0540 09/27/15 0152 09/27/15 0522  WBC 15.0* 14.2*  --   --  23.0*  HGB 11.0* 9.9*  --   --  9.7*  PLT 327 258  --   --  192  CREATININE 4.23*  --  4.72* 4.94* 4.95*   Estimated Creatinine Clearance: 19.7 mL/min (by C-G formula based on Cr of 4.95). No results for input(s): VANCOTROUGH, VANCOPEAK, VANCORANDOM, GENTTROUGH, GENTPEAK, GENTRANDOM, TOBRATROUGH, TOBRAPEAK, TOBRARND, AMIKACINPEAK, AMIKACINTROU, AMIKACIN in the last 72 hours.   Microbiology: Recent Results (from the past 720 hour(s))  Blood Culture (routine x 2)     Status: None (Preliminary result)   Collection Time: 09/22/2015  6:20 PM  Result Value Ref Range Status   Specimen Description BLOOD CENTRAL LINE  Final   Special Requests BOTTLES DRAWN AEROBIC AND ANAEROBIC [place] [facility]  Final   Culture   Final    NO GROWTH 1 DAY Performed at [facility]    Report Status PENDING  Incomplete  Urine culture     Status: None   Collection Time: 09/06/2015  8:06 PM  Result Value Ref Range Status   Specimen Description URINE, RANDOM  Final   Special Requests NONE  Final   Culture   Final    NO GROWTH 1  DAY Performed at [facility]    Report Status 09/07/2015 FINAL  Final  MRSA PCR Screening     Status: None   Collection Time: 09/17/2015  9:23 PM  Result Value Ref Range Status   MRSA by PCR NEGATIVE NEGATIVE Final    Comment:        The GeneXpert MRSA Assay (FDA approved for NASAL specimens only), is one component of a comprehensive MRSA colonization surveillance program. It is not intended to diagnose MRSA infection nor to guide or monitor treatment for MRSA infections.   Blood Culture (routine x 2)     Status: None (Preliminary result)   Collection Time: 09/28/2015 10:37 PM  Result Value Ref Range Status   Specimen Description BLOOD RIGHT ARM  Final   Special Requests BOTTLES DRAWN AEROBIC AND ANAEROBIC 3CC  Final   Culture   Final    NO GROWTH 1 DAY Performed at [facility]    Report Status PENDING  Incomplete    Medical History: Past Medical History  Diagnosis Date  . Alcohol abuse 08/2015    admits to 6 pack beer daily.   . Mass of neck 05/2015    multiple soft tissue masses, palpable and seen on CT.  present since at leat 05/2015.   Marland Kitchen Anemia 08/2015    Iron deficient, microcytic  . Elevated LFTs 08/2015    acute hep ABC serologies negative.   Marland Kitchen Herpes zoster  03/2014    right C3-4, T1-2 distribution.   . Gynecomastia 08/2015    per PET scan  . Opacity of lung on imaging study 08/2015    bilateral, with effusion and mild ephysema per PET scan  . Diverticulosis of colon 08/2015    per PET scan  . Hepatomegaly 08/2015    and 1.6 cm liver cyst per PET.   Marland Kitchen Ascites 08/2015    small abd ascites and mild anasarca per PET  . CHF (congestive heart failure) 08/2015    Severe dilatation of all of the heart chambers. EF 10 to 15%.  Severe LV and moderate RV systolic dysfunction. Restrictive pattern of diastolic dysfunction. Moderate aortic, mitral, pulmonic and tricuspid regurgitation. Mild to moderate pulmonary hypertension  . AKI (acute kidney injury) 08/2015  .  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 Coffee ground emesis 09/27/2015    grade c erosive esophagitis and small HH on EGD    Medications:  Scheduled:  . sodium chloride   Intravenous Once  . dexamethasone (DECADRON) IVPB [facility]  40 mg Intravenous Q24H  . fentaNYL (SUBLIMAZE) injection  50 mcg Intravenous Once  . furosemide  160 mg Intravenous 3 times per day  . ipratropium-albuterol  3 mL Nebulization Q6H  . [START ON 09/29/2015] pantoprazole (PROTONIX) IV  40 mg Intravenous Q12H  . piperacillin-tazobactam (ZOSYN)  IV  3.375 g Intravenous Q8H   Infusions:  . sodium chloride 10 mL/hr at 09/25/15 0949  . fentaNYL infusion INTRAVENOUS 100 mcg/hr (09/27/15 0805)  . milrinone 0.25 mcg/kg/min (09/27/15 0258)  . norepinephrine (LEVOPHED) Adult infusion 16 mcg/min (09/27/15 0200)  . pantoprozole (PROTONIX) infusion 8 mg/hr (09/27/15 0258)  .  sodium bicarbonate  infusion 1000 mL 50 mL/hr at 09/27/2015 2139   PRN:   Assessment: 54 yo male presented undergoing work up by Dr. Marin Olp for neck mass presents with worsening low back pain despite steroids and tramadol. Recent PET scan consistent with lymphoma, but metastatic carcinoma is not ruled out.  In ED, was found to be hypothermic, tachycardic with elevated lactic acid and mottling of lower extremities noted.Currently on Day #3 of IV Zosyn for likely CAP. WBC up to 23 today but likely due to steroids. Pt remains afebrile. CrCl borderline at 20 mL/min   9/25 >> Vanc >> 9/26 9/25 >> Zosyn >>  9/25 blood x 2: NGTD  9/25 urine: NegF  9/28 Trach asp>>  Goal of Therapy:  Zosyn dose appropriate for renal function  Plan:  -Continue Zosyn 3.375 gm IV Q 8 hours (EI infusion) -Monitor SCr closely. If trends up further, decrease Zosyn dose -Monitor CBC, cultures and clinical progress   Albertina Parr, PharmD., BCPS Clinical Pharmacist Pager [phone]

## 2015-09-27 NOTE — Progress Notes (Addendum)
Advanced Heart Failure Team Rounding Note  Referring Physician: Dr Vaughan Browner Primary Physician: Primary Cardiologist:  None Oncologist: Dr Marin Olp  Reason for Consultation: Acute Systolic Heart Failure   HPI:    [name] is a 54 year old male with h/o tobacco and ETOH abuse who recently developed large cervical adenopathy and intractable back pain. Underwent PET scan earlier this month which showed hypermetabolic left cervical lymphadenopathy involving levels II-IV and presence of hepatomegaly with diffuse liver hypermetabolism suggesting probably lymphoma.   Admitted 9/26 with multisystem organ failure. Echo performed and EF 10-15% with moderate RV dysfunction. Initial co-ox drawn was 55%. Started on milrinone. Intubated and transferred to [facility]. Developed large GI bleed. EGDwith esophagitis.   Underwent core biopsy of cervical node 9/27.   Remains on milrinone 0.25  and norepi  Urine output brisk. Co-ox 76% Cr stable at 4.9  No further GI bleeding     Objective:    Vital Signs:   Temp:  [97.9 F (36.6 C)-99.5 F (37.5 C)] 98.9 F (37.2 C) (09/28 1539) Pulse Rate:  [100-122] 116 (09/28 1534) Resp:  [21-33] 26 (09/28 1534) BP: (79-110)/(42-73) 100/54 mmHg (09/28 1534) SpO2:  [96 %-100 %] 97 % (09/28 1534) FiO2 (%):  [30 %-40 %] 30 % (09/28 1534) Weight:  [202 lb 13.2 oz (92 kg)] 202 lb 13.2 oz (92 kg) (09/28 0500)    Weight change: Filed Weights   09/25/15 0426 09/13/2015 0500 09/27/15 0500  Weight: 195 lb 8.8 oz (88.7 kg) 204 lb 5.9 oz (92.7 kg) 202 lb 13.2 oz (92 kg)    Intake/Output:   Intake/Output Summary (Last 24 hours) at 09/27/15 1625 Last data filed at 09/27/15 1500  Gross per 24 hour  Intake 3235.69 ml  Output   4575 ml  Net -1339.31 ml     Physical Exam: General:  Intubated awake on vent/agitated at times HEENT: normal except for ETT Neck: supple. RIJ TLC . Prominent left neck lymphadenopathy Cor: PMI laterally displaced. Tachy regular + s3 Lungs:  clear Abdomen: soft, nontender, nondistended. No bruits or masses. Good bowel sounds. Extremities: no cyanosis, clubbing, rash, no edema Neuro: intubated sedated  Telemetry: sinus tach 100-120  Labs: Basic Metabolic Panel:  Recent Labs Lab 09/28/2015 1935  09/25/15 0400 09/25/15 1524 09/23/2015 0539 09/15/2015 0540 09/27/15 0152 09/27/15 0522  NA  --   < > 139 140  --  139 140 139  K  --   < > 6.2* 5.5*  --  4.3 3.6 3.9  CL  --   < > 103 103  --  100* 96* 95*  CO2  --   < > 14* 18*  --  24 27 28   GLUCOSE  --   < > 145* 148*  --  136* 174* 161*  BUN  --   < > 96* 106*  --  113* 120* 118*  CREATININE  --   < > 3.47* 4.23*  --  4.72* 4.94* 4.95*  CALCIUM  --   < > 7.3* 7.0*  --  6.0* 5.9* 5.8*  MG 2.9*  --  2.8*  --  2.5*  --  2.5* 2.5*  PHOS 11.0*  --  11.1*  --   --  7.1* 6.2* 6.3*  < > = values in this interval not displayed.  Liver Function Tests:  Recent Labs Lab 09/21/2015 1632 09/25/15 0400 09/25/15 1524 09/16/2015 0539 09/22/2015 0540 09/27/15 0522  AST 389* 701* 649* 516*  --  251*  ALT  330* 345* 347* 290*  --  226*  ALKPHOS 266* 230* 215* 173*  --  169*  BILITOT 4.1* 4.3* 5.1* 5.8*  --  7.1*  PROT 6.8 5.8* 5.2* 5.2*  --  5.4*  ALBUMIN 2.6* 2.3* 2.0* 1.9* 1.8* 2.0*  2.0*   No results for input(s): LIPASE, AMYLASE in the last 168 hours.  Recent Labs Lab 09/25/15 1523  AMMONIA 37*    CBC:  Recent Labs Lab 09/08/2015 1632 08/31/2015 1656 09/25/15 0400 09/25/15 1524 09/09/2015 0539 09/27/15 0522  WBC 18.6*  --  24.8* 15.0* 14.2* 23.0*  NEUTROABS 16.5*  --   --  13.7* 13.3* 21.9*  HGB 11.8* 15.0 10.9* 11.0* 9.9* 9.7*  HCT 38.9* 44.0 35.9* 34.9* 31.0* 30.3*  MCV 79.2  --  79.6 78.3 75.4* 74.6*  PLT 383  --  335 327 258 192    Cardiac Enzymes:  Recent Labs Lab 09/12/2015 1630 09/25/15 0400 09/25/15 0800 09/25/15 1524 09/01/2015 0539 09/27/15 0152  CKTOTAL 1533* 1111* 1052* 938* 674*  --   CKMB  --   --  73.5*  --   --   --   TROPONINI  --   --   --   --    --  0.09*    BNP: BNP (last 3 results) No results for input(s): BNP in the last 8760 hours.  ProBNP (last 3 results) No results for input(s): PROBNP in the last 8760 hours.   CBG:  Recent Labs Lab 09/25/2015 2356 09/27/15 0358 09/27/15 0759 09/27/15 1203 09/27/15 1538  GLUCAP 148* 160* 151* 158* 148*    Coagulation Studies:  Recent Labs  09/25/15 0800 09/25/15 1524 09/06/2015 0539 09/05/2015 1215 09/27/15 0522  LABPROT 27.6* 28.6* 23.4* 22.3* 20.2*  INR 2.61* 2.75* 2.10* 1.97* 1.73*    Other results: EKG: ST 107 RBBB/LAFB  Imaging: Mr Lumbar Spine Wo Contrast  09/07/2015   CLINICAL DATA:  Low back pain for 1 month unable to urinate. Critically ill. Renal failure. Recent diagnosis of lymphoma.  EXAM: MRI LUMBAR SPINE WITHOUT CONTRAST  TECHNIQUE: Multiplanar, multisequence MR imaging of the lumbar spine was performed. No intravenous contrast was administered.  COMPARISON:  CT abdomen and pelvis September 24, 2015  FINDINGS: Low signal to noise ratio.  Lumbar vertebral bodies and posterior elements are intact and aligned and maintenance of lumbar lordosis. Using the reference level of the last well-formed intervertebral disc as L5-S1, moderate to severe L5-S1 disc height loss with moderate to severe subacute discogenic endplate changes. No STIR signal abnormality to suggest acute osseous process. Moderate chronic discogenic endplate changes F6-2. Mild desiccation L4-5 disc.  Conus medullaris terminates at L1-2 and appears normal morphology and signal characteristics. Cauda equina is unremarkable. Bright STIR signal within the paraspinal soft tissues without focal fluid collection.  Level by level evaluation:  L1-2: Small to moderate broad-based disc bulge asymmetric to LEFT. No canal stenosis. Minimal LEFT neural foraminal narrowing.  L2-3: Small broad-based disc bulge. Mild facet arthropathy and ligamentum flavum redundancy without canal stenosis or neural foraminal narrowing.   L3-4: Small broad-based disc bulge. Mild facet arthropathy and ligamentum flavum redundancy without canal stenosis. Superimposed suspected small LEFT extra foraminal disc protrusion/extrusion. Mild to moderate RIGHT, moderate LEFT neural foraminal narrowing.  L4-5: Small broad-based disc bulge. Mild facet arthropathy and ligamentum flavum redundancy without canal stenosis. Mild to moderate LEFT neural foraminal narrowing.  L5-S1: Small broad-based disc bulge asymmetric to the RIGHT, superimposed small central disc protrusion and tiny central extrusion component suspected,  best characterized on sagittal 8/14, without free fragment. Mild facet arthropathy without canal stenosis. Moderate RIGHT, mild LEFT neural foraminal narrowing.  IMPRESSION: Technically suboptimal examination.  No acute lumbar spine fracture, malalignment or suspicious signal.  Paraspinal muscle edema/denervation.  Degenerative change of the lumbar spine without canal stenosis. L3-4 through L5-S1 neural foraminal narrowing: Moderate on the LEFT at L3-4 and on the RIGHT at L5-S1.  Small suspected contiguous central L5-S1 disc extrusion. Possible small LEFT extra foraminal L3-4 disc protrusion/extrusion could affect the exited LEFT L4 nerve.   Electronically Signed   By: Elon Alas M.D.   On: 09/18/2015 04:04   US Biopsy  09/13/2015   CLINICAL DATA:  54 year old male with a history of multiple left-sided neck mass, likely lymphadenopathy and suspicious for lymphoma.  He is currently in the ICU, and has elevated INR.  EXAM: ULTRASOUND GUIDED CORE BIOPSY OF LEFT CERVICAL ADENOPATHY  MEDICATIONS: None  PROCEDURE: The procedure, risks, benefits, and alternatives were explained to the patient. Questions regarding the procedure were encouraged and answered. The patient understands and consents to the procedure.  Ultrasound survey was performed with images stored and sent to PACs.  The left neck was prepped with chlorhexidine in a sterile fashion,  and a sterile drape was applied covering the operative field. A sterile gown and sterile gloves were used for the procedure. Local anesthesia was provided with 1% Lidocaine.  Ultrasound guidance was used for generous infiltration of the skin and subcutaneous tissues for local anesthesia. Ultrasound guidance was then used to day rect an 18 gauge biopsy gun into selected lymph node of the left cervical region. Four separate 18 gauge core biopsy were retrieved.  Specimen placed in the saline.  Final image was stored.  Patient tolerated the procedure well and remained hemodynamically stable throughout.  No complications were encountered and no significant blood loss was encounter  COMPLICATIONS: None.  FINDINGS: Ultrasound survey demonstrates multiple pathologic lymph nodes of the left cervical region.  Images during the case demonstrate needle tip within the selected node on each pass.  Final image demonstrates no complicating features.  IMPRESSION: Status post ultrasound-guided core biopsy of left-sided cervical adenopathy. Tissue specimen sent to pathology for complete histopathologic analysis.  Signed,  Dulcy Fanny. Earleen Newport, DO  [facility]  [facility]   Electronically Signed   By: Corrie Mckusick D.O.   On: 09/20/2015 15:15   Dg Abd Portable 1v  09/27/2015   CLINICAL DATA:  Enteric tube placement  EXAM: PORTABLE ABDOMEN - 1 VIEW  COMPARISON:  09/20/2015 CT abdomen/pelvis .  FINDINGS: Enteric tube terminates in the body of the stomach. No dilated small bowel loops. No evidence of pneumatosis or pneumoperitoneum. Vascular calcifications are noted in the pelvis. Patchy opacity is present at the left lung base. Degenerative changes throughout the visualized thoracolumbar spine.  IMPRESSION: Enteric tube terminates in the body of the stomach. Nonobstructive bowel gas pattern.   Electronically Signed   By: Ilona Sorrel M.D.   On: 09/27/2015 13:43     Medications:      Current Medications: . sodium chloride   Intravenous Once  . dexamethasone (DECADRON) IVPB [facility]  40 mg Intravenous Q24H  . fentaNYL (SUBLIMAZE) injection  50 mcg Intravenous Once  . furosemide  160 mg Intravenous 3 times per day  . ipratropium-albuterol  3 mL Nebulization Q6H  . [START ON 09/29/2015] pantoprazole (PROTONIX) IV  40 mg Intravenous Q12H  . piperacillin-tazobactam (ZOSYN)  IV  3.375 g Intravenous  Q8H    Infusions: . sodium chloride 10 mL/hr at 09/25/15 0949  . fentaNYL infusion INTRAVENOUS 125 mcg/hr (09/27/15 1238)  . milrinone 0.25 mcg/kg/min (09/27/15 0258)  . norepinephrine (LEVOPHED) Adult infusion 15 mcg/min (09/27/15 1202)  . pantoprozole (PROTONIX) infusion 8 mg/hr (09/27/15 1358)  .  sodium bicarbonate  infusion 1000 mL 50 mL/hr at 09/25/2015 2139     Assessment:   1. Cardiogenic shock 2. Acute systolic HF EF 25-42% 3. Acute respiratory failure 4. Multisystem organ failure 5. Acute kidney injury 6. Shock liver 7. Hyperkalemia 8. 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 Tobacco use 10. ETOH use - 4+ beers per day 11. Family h/o NICM in brother 71. Massive upper GI bleed     --severe esophagitis by EGD on 9/27 13. Coagulopathy - due t shock liver with ? DIC  Plan/Discussion:    Remains on vent and dual pressors. Good urine output but creatinine has plateaued. Continue milrinone and norepi.  Continue diuresis.   Path results pending.   We will follow continue to follow closely.   The patient is critically ill with multiple organ systems failure and requires high complexity decision making for assessment and support, frequent evaluation and titration of therapies, application of advanced monitoring technologies and extensive interpretation of multiple databases.   Critical Care Time devoted to patient care services described in this note is 35 Minutes.   Shawnese Magner,MD  Advanced Heart Failure Team Pager  [phone] (M-F; 7a - 4p)  Please contact [facility] for night-coverage after hours (4p -7a ) and weekends on [email or web address]

## 2015-09-27 NOTE — Progress Notes (Signed)
Daily Rounding Note  09/27/2015, 8:19 AM  LOS: 3 days   SUBJECTIVE:       Remains on Levophed, Protonix, Fentanyl, Bicarb, milrinone drips.  Remains on vent.  NGT removed at EGD, so presently no gastric access. No emesis.  No stools.   OBJECTIVE:         Vital signs in last 24 hours:    Temp:  [97.4 F (36.3 C)-99.5 F (37.5 C)] 99.5 F (37.5 C) (09/28 0800) Pulse Rate:  [77-118] 117 (09/28 0800) Resp:  [8-33] 26 (09/28 0800) BP: (78-109)/(33-61) 94/49 mmHg (09/28 0800) SpO2:  [93 %-100 %] 98 % (09/28 0800) FiO2 (%):  [40 %] 40 % (09/28 0800) Weight:  [202 lb 13.2 oz (92 kg)] 202 lb 13.2 oz (92 kg) (09/28 0500)   Filed Weights   09/25/15 0426 09/06/2015 0500 09/27/15 0500  Weight: 195 lb 8.8 oz (88.7 kg) 204 lb 5.9 oz (92.7 kg) 202 lb 13.2 oz (92 kg)   General: Did not awaken to my gentle exam.     Heart: RRR Chest: breathing unlabored on vent. Abdomen: soft, distended, BS hypoactive but no tinkling or tympanitic BS  Extremities: non-pitting pedal edema Neuro/Psych:  Resting quietly, did not awaken during exam.  RN states pt does awaken and follow commands.  GU: scrotal edema  Intake/Output from previous day: 09/27 0701 - 09/28 0700 In: 4448.2 [I.V.:3198.8; Blood:650; IV Piggyback:599.4] Out: 4400 [Urine:3100; Emesis/NG output:1300]  Intake/Output this shift: Total I/O In: 131.7 [I.V.:131.7] Out: 550 [Urine:550]  Lab Results:  Recent Labs  09/25/15 1524 09/16/2015 0539 09/27/15 0522  WBC 15.0* 14.2* 23.0*  HGB 11.0* 9.9* 9.7*  HCT 34.9* 31.0* 30.3*  PLT 327 258 192   BMET  Recent Labs  09/06/2015 0540 09/27/15 0152 09/27/15 0522  NA 139 140 139  K 4.3 3.6 3.9  CL 100* 96* 95*  CO2 24 27 28   GLUCOSE 136* 174* 161*  BUN 113* 120* 118*  CREATININE 4.72* 4.94* 4.95*  CALCIUM 6.0* 5.9* 5.8*   LFT  Recent Labs  09/25/15 0400 09/25/15 1524 09/23/2015 0539 09/16/2015 0540 09/27/15 0522  PROT  5.8* 5.2* 5.2*  --  5.4*  ALBUMIN 2.3* 2.0* 1.9* 1.8* 2.0*  2.0*  AST 701* 649* 516*  --  251*  ALT 345* 347* 290*  --  226*  ALKPHOS 230* 215* 173*  --  169*  BILITOT 4.3* 5.1* 5.8*  --  7.1*  BILIDIR 3.1*  --  4.0*  --  5.0*  IBILI 1.2*  --  1.8*  --  2.1*   PT/INR  Recent Labs  09/19/2015 1215 09/27/15 0522  LABPROT 22.3* 20.2*  INR 1.97* 1.73*   Hepatitis Panel  Recent Labs  09/25/15 1521 09/25/15 1524  HEPBSAG Negative  --   HCVAB  --  <0.1  HEPAIGM  --  Negative    Studies/Results: Mr Lumbar Spine Wo Contrast  09/01/2015   CLINICAL DATA:  Low back pain for 1 month unable to urinate. Critically ill. Renal failure. Recent diagnosis of lymphoma.  EXAM: MRI LUMBAR SPINE WITHOUT CONTRAST  TECHNIQUE: Multiplanar, multisequence MR imaging of the lumbar spine was performed. No intravenous contrast was administered.  COMPARISON:  CT abdomen and pelvis September 24, 2015  FINDINGS: Low signal to noise ratio.  Lumbar vertebral bodies and posterior elements are intact and aligned and maintenance of lumbar lordosis. Using the reference level of the last well-formed intervertebral disc as L5-S1, moderate to  severe L5-S1 disc height loss with moderate to severe subacute discogenic endplate changes. No STIR signal abnormality to suggest acute osseous process. Moderate chronic discogenic endplate changes A6-3. Mild desiccation L4-5 disc.  Conus medullaris terminates at L1-2 and appears normal morphology and signal characteristics. Cauda equina is unremarkable. Bright STIR signal within the paraspinal soft tissues without focal fluid collection.  Level by level evaluation:  L1-2: Small to moderate broad-based disc bulge asymmetric to LEFT. No canal stenosis. Minimal LEFT neural foraminal narrowing.  L2-3: Small broad-based disc bulge. Mild facet arthropathy and ligamentum flavum redundancy without canal stenosis or neural foraminal narrowing.  L3-4: Small broad-based disc bulge. Mild facet  arthropathy and ligamentum flavum redundancy without canal stenosis. Superimposed suspected small LEFT extra foraminal disc protrusion/extrusion. Mild to moderate RIGHT, moderate LEFT neural foraminal narrowing.  L4-5: Small broad-based disc bulge. Mild facet arthropathy and ligamentum flavum redundancy without canal stenosis. Mild to moderate LEFT neural foraminal narrowing.  L5-S1: Small broad-based disc bulge asymmetric to the RIGHT, superimposed small central disc protrusion and tiny central extrusion component suspected, best characterized on sagittal 8/14, without free fragment. Mild facet arthropathy without canal stenosis. Moderate RIGHT, mild LEFT neural foraminal narrowing.  IMPRESSION: Technically suboptimal examination.  No acute lumbar spine fracture, malalignment or suspicious signal.  Paraspinal muscle edema/denervation.  Degenerative change of the lumbar spine without canal stenosis. L3-4 through L5-S1 neural foraminal narrowing: Moderate on the LEFT at L3-4 and on the RIGHT at L5-S1.  Small suspected contiguous central L5-S1 disc extrusion. Possible small LEFT extra foraminal L3-4 disc protrusion/extrusion could affect the exited LEFT L4 nerve.   Electronically Signed   By: Elon Alas M.D.   On: 09/09/2015 04:04   US Biopsy  09/13/2015   CLINICAL DATA:  54 year old male with a history of multiple left-sided neck mass, likely lymphadenopathy and suspicious for lymphoma.  He is currently in the ICU, and has elevated INR.  EXAM: ULTRASOUND GUIDED CORE BIOPSY OF LEFT CERVICAL ADENOPATHY  MEDICATIONS: None  PROCEDURE: The procedure, risks, benefits, and alternatives were explained to the patient. Questions regarding the procedure were encouraged and answered. The patient understands and consents to the procedure.  Ultrasound survey was performed with images stored and sent to PACs.  The left neck was prepped with chlorhexidine in a sterile fashion, and a sterile drape was applied covering the  operative field. A sterile gown and sterile gloves were used for the procedure. Local anesthesia was provided with 1% Lidocaine.  Ultrasound guidance was used for generous infiltration of the skin and subcutaneous tissues for local anesthesia. Ultrasound guidance was then used to day rect an 18 gauge biopsy gun into selected lymph node of the left cervical region. Four separate 18 gauge core biopsy were retrieved.  Specimen placed in the saline.  Final image was stored.  Patient tolerated the procedure well and remained hemodynamically stable throughout.  No complications were encountered and no significant blood loss was encounter  COMPLICATIONS: None.  FINDINGS: Ultrasound survey demonstrates multiple pathologic lymph nodes of the left cervical region.  Images during the case demonstrate needle tip within the selected node on each pass.  Final image demonstrates no complicating features.  IMPRESSION: Status post ultrasound-guided core biopsy of left-sided cervical adenopathy. Tissue specimen sent to pathology for complete histopathologic analysis.  Signed,  Dulcy Fanny. Earleen Newport, DO  [facility]  [facility]   Electronically Signed   By: Corrie Mckusick D.O.   On: 09/23/2015 15:15   Dg Chest [facility]  1 View  09/25/2015   CLINICAL DATA:  Respiratory insufficiency. Evaluate ET tube placement.  EXAM: PORTABLE CHEST 1 VIEW  COMPARISON:  09/25/2015  FINDINGS: There is a right IJ catheter with tip in the cavoatrial junction. ET tube tip is above the carina. There is a nasogastric tube with tip in the stomach. Cardiac enlargement noted. Bilateral pulmonary opacities are again noted compatible with edema and/or multifocal infection.  IMPRESSION: 1. Persistent bilateral pulmonary opacities compatible with pneumonia and/or edema.   Electronically Signed   By: Kerby Moors M.D.   On: 09/25/2015 13:31    ASSESMENT:   * CG emesis. On PPI drip.  08/31/2015 EGD: erosive esophagitis  with adherent old blood, small HH, retained gastric contents including solids (? Gastroparesis).   * Elevated LFTs.Suspect shock liver and possible lymphoma infiltration. Transaminases steadily improving. Liver homogeneous on CT 0/34, hypermetabolic liver fx on PET.  Reported hx of drinking 6 pack beer per day. Hep A igM, Hep B surface Ag, Hep B core Ab, HCV all negative.   * Coagulopathy. S/p 10 mg IV Vit K, 2 FFP yesterday, s more ordered this AM. ? Due to shock liver and DIC.   * ABL on chronic anemia. Microcytic. Low iron level 2 weeks ago.   * Resp failure. Pneumonia.  Sepsis.  Intubated 9/26.  Blood clx negative to date. Lactic acidosis resolved. WBCs rising.   * AKI. Urine output improved.    * Cardiomyopathy, EF 10 to 15%, cardiogenic shock. Troponin I 0.09. Elevated CK and CK MB. NSVT overnight.   * Neck masses, likely Lymphoma. Biopsy 9/27.  Path pndg.  On Decadron for ?lymphoproliferative process.   * LE weakness and back pain. imcomplete MRI shows degenerative changes and disc protrusion at lumbar and cervical spine.   * Hypoalbuminemia.  Currently NPO.    PLAN   *  Ok to begin tube feedings.  RD note outlines recs for formula/rate.    *  After completing 72 hours of PPI drip, switch to BID IV or oral Protonix.      Paul Morton  09/27/2015, 8:19 AM Pager: [phone]

## 2015-09-27 NOTE — Progress Notes (Signed)
Mr. magos is still intubated.  He did have the lymph node biopsy yesterday.  Hopefully, he'll be a primary report out today.  He also underwent an upper endoscopy. This showed a esophageal tear. He did not have any obvious varices.  He still is on pressor support.  His liver tests are improving. His CK is coming down. His lactic acid is getting better. His CBC is pretty stable.  For now, this is going to be about his biopsy.  He is on steroids right now. I have him on 40 mg day of Decadron.  His vital signs show temperature of 99.3. Pulse is 1:15. His blood pressure 98/51. His lungs show some scattered crackles bilaterally. Cardiac exam is tachycardic but regular. Abdomen is soft. There may be some slight distention. There is no obvious fluid wave. There is no palpable liver or spleen. Extremities shows no clubbing, cyanosis or edema.  I appreciate the inside from Dr. Haroldine Laws. Me, his cardiac function is clearly his limiting illness.  As far as treating his underlying malignancy, this will be based upon the pathology and his cardiac status.  I will like to think that his cardiac status will improve.  Pete E.  2 Timothy 1:7

## 2015-09-27 NOTE — Progress Notes (Addendum)
PULMONARY / CRITICAL CARE MEDICINE   Name: Paul Morton MRN: [id number] DOB: [birth year]    ADMISSION DATE:  09/11/2015  REFERRING MD :  EDP  CHIEF COMPLAINT:  Sepsis   INITIAL PRESENTATION: 55yo male smoker with recent diagnosis lymphoma (still in early stages of workup) who presented 9/25 with progressive back pain.  In ER found to be tachycardic, hypotensive with lactate 14, AKI and coagulopathy.  PCCM called to admit.    STUDIES:  PET 9/22 - Bulky confluent hypermetabolic left cervical lymphadenopathy involving levels II-V. Given the presence of hepatomegaly with diffuse liver hypermetabolism, a diagnosis of lymphoma is favored, however metastatic carcinoma remains on the differential.  Hypermetabolic patchy ground-glass opacity and consolidation in both upper lobes and right lower lobe, favor an infectious or inflammatory etiology.  TTE 9/26 - EF 10-15% w/o regional wall motion abnormality. Grade 3 diastolic dysfunction. Moderate AR. Moderate MR. RV severely dilated w/ moderate reduction in systolic function. PASP 43 mmHg. No pericardial effusion.  RLE Venous Duplex 9/26 - no DVT or SVT  MRI spine 9/25 - incomplete exam, No evidence of significant spinal stenosis or cord compression in the cervical or visualized thoracic spine.  MRI L-spine 9/27 - suboptimal exam. No malignant or suspicious signal.  SIGNIFICANT EVENTS: 9/25 - Admitted to hospital 9/25 - Right IJ CVC by ED 9/26 - Intubated for pending respiratory failure & transferred to [facility] 9/27 - EGD w/ erosive esophagitis 9/27 - FNA neck mass  SUBJECTIVE: Patient had short runs of wide complex tachycardia overnight. No other acute events. GI comfortable with initiating tube feeds today.  REVIEW OF SYSTEMS: Unobtainable as the patient is currently intubated and sedated.  VITAL SIGNS: Temp:  [97.8 F (36.6 C)-99.5 F (37.5 C)] 99.1 F (37.3 C) (09/28 1000) Pulse Rate:  [77-119] 109 (09/28 1000) Resp:  [23-33] 23 (09/28  1000) BP: (83-110)/(42-61) 93/42 mmHg (09/28 1000) SpO2:  [94 %-100 %] 100 % (09/28 1000) FiO2 (%):  [40 %] 40 % (09/28 1000) Weight:  [202 lb 13.2 oz (92 kg)] 202 lb 13.2 oz (92 kg) (09/28 0500) HEMODYNAMICS: CVP:  [9 mmHg-15 mmHg] 9 mmHg VENTILATOR SETTINGS: Vent Mode:  [-] PRVC FiO2 (%):  [40 %] 40 % Set Rate:  [26 bmp] 26 bmp Vt Set:  [600 mL] 600 mL PEEP:  [5 cmH20] 5 cmH20 Plateau Pressure:  [13 cmH20-21 cmH20] 13 cmH20 INTAKE / OUTPUT:  Intake/Output Summary (Last 24 hours) at 09/27/15 1122 Last data filed at 09/27/15 1100  Gross per 24 hour  Intake 3607.59 ml  Output   4950 ml  Net -1342.41 ml    PHYSICAL EXAMINATION: General:  Sedated. No distress. Wife sitting in chair bedside.  Integument:  Warm & dry. No rash on exposed skin.  HEENT:  Endotracheal tube in place. No scleral injection. Dressing over neck on left postbiopsy. Cardiovascular:  Regular rate. No edema.  Pulmonary: Coarse breath sounds bilaterally. Symmetric chest wall rise on ventilator. Abdomen: Soft. Normal bowel sounds. Nondistended.  Neurological: More awake this morning. Spontaneously moving all 4 extremities.  LABS:  CBC  Recent Labs Lab 09/25/15 1524 09/25/2015 0539 09/27/15 0522  WBC 15.0* 14.2* 23.0*  HGB 11.0* 9.9* 9.7*  HCT 34.9* 31.0* 30.3*  PLT 327 258 192   Coag's  Recent Labs Lab 09/11/2015 0539 09/08/2015 1215 09/27/15 0522  APTT 35 31 30  INR 2.10* 1.97* 1.73*   BMET  Recent Labs Lab 09/03/2015 0540 09/27/15 0152 09/27/15 0522  NA 139 140 139  K  4.3 3.6 3.9  CL 100* 96* 95*  CO2 24 27 28   BUN 113* 120* 118*  CREATININE 4.72* 4.94* 4.95*  GLUCOSE 136* 174* 161*   Electrolytes  Recent Labs Lab 09/18/2015 0539 09/20/2015 0540 09/27/15 0152 09/27/15 0522  CALCIUM  --  6.0* 5.9* 5.8*  MG 2.5*  --  2.5* 2.5*  PHOS  --  7.1* 6.2* 6.3*   Sepsis Markers  Recent Labs Lab 09/04/2015 2237  09/25/15 0800 09/02/2015 0541 09/27/15 0524  LATICACIDVEN 8.6*  < > 8.4*  2.2* 1.4  PROCALCITON 5.88  --   --   --   --   < > = values in this interval not displayed. ABG  Recent Labs Lab 09/25/15 0734 09/25/15 1310 09/25/15 1712  PHART 7.237* 7.162* 7.311*  PCO2ART 25.4* 41.4 34.8*  PO2ART 83.6 352* 78.1*   Liver Enzymes  Recent Labs Lab 09/25/15 1524 09/22/2015 0539 09/05/2015 0540 09/27/15 0522  AST 649* 516*  --  251*  ALT 347* 290*  --  226*  ALKPHOS 215* 173*  --  169*  BILITOT 5.1* 5.8*  --  7.1*  ALBUMIN 2.0* 1.9* 1.8* 2.0*  2.0*   Cardiac Enzymes  Recent Labs Lab 09/27/15 0152  TROPONINI 0.09*   Glucose  Recent Labs Lab 09/22/2015 1201 09/23/2015 1523 09/15/2015 1918 09/27/2015 2356 09/27/15 0358 09/27/15 0759  GLUCAP 131* 128* 145* 148* 160* 151*    Imaging US Biopsy  09/22/2015   CLINICAL DATA:  54 year old male with a history of multiple left-sided neck mass, likely lymphadenopathy and suspicious for lymphoma.  He is currently in the ICU, and has elevated INR.  EXAM: ULTRASOUND GUIDED CORE BIOPSY OF LEFT CERVICAL ADENOPATHY  MEDICATIONS: None  PROCEDURE: The procedure, risks, benefits, and alternatives were explained to the patient. Questions regarding the procedure were encouraged and answered. The patient understands and consents to the procedure.  Ultrasound survey was performed with images stored and sent to PACs.  The left neck was prepped with chlorhexidine in a sterile fashion, and a sterile drape was applied covering the operative field. A sterile gown and sterile gloves were used for the procedure. Local anesthesia was provided with 1% Lidocaine.  Ultrasound guidance was used for generous infiltration of the skin and subcutaneous tissues for local anesthesia. Ultrasound guidance was then used to day rect an 18 gauge biopsy gun into selected lymph node of the left cervical region. Four separate 18 gauge core biopsy were retrieved.  Specimen placed in the saline.  Final image was stored.  Patient tolerated the procedure well and  remained hemodynamically stable throughout.  No complications were encountered and no significant blood loss was encounter  COMPLICATIONS: None.  FINDINGS: Ultrasound survey demonstrates multiple pathologic lymph nodes of the left cervical region.  Images during the case demonstrate needle tip within the selected node on each pass.  Final image demonstrates no complicating features.  IMPRESSION: Status post ultrasound-guided core biopsy of left-sided cervical adenopathy. Tissue specimen sent to pathology for complete histopathologic analysis.  Signed,  Dulcy Fanny. Earleen Newport, DO  [facility]  [facility]   Electronically Signed   By: Corrie Mckusick D.O.   On: 09/18/2015 15:15    ASSESSMENT / PLAN:  PULMONARY OEET 9/26>>> A: Acute hypoxic respiratory failure  Possible CAP H/O Tobacco Use  P:   Vent Bundle  Duonebs q6hr SBT on PS 0/0  CARDIOVASCULAR CVL R IJ CVL (EDP) 9/25>>> A: Nonischemic Cardiomyopathy Shock - Sepsis versus Cardiac in etiology  P:  Cardiology/Heart Failure Team consulted Milrinone gtt Levophed gtt to maintain MAP >65  RENAL A: Acute Renal Failure - Worsening function but improved output on Lasix. Hyperkalemia - resolved. Hyponatremia - resolved  Metabolic Acidosis - improving on bicarb gtt Lactic acidosis - improving  P:   Nephrology consulted Further Lasix per nephrology Trending Lactic Acid Monitoring UOP with Foley catheter Trending daily electrolytes  GASTROINTESTINAL A: Shock liver - improving Upper GIB - secondary to erosive esophagitis on EGD  P:   Place OGT today & begin tube feedings Trending LFTs daily Protonix gtt for 3 days (9/29) then bid Protonix GI consulted - s/p EGD Hepatitis A/B/C negative on serum  HEMATOLOGIC Coagulopathy - Unclear etiology. Improving. S/P Vitamin K & 4u FFP. Leukocytosis - improving Anemia - Secondary to upper GIB Possible Lymphoma - FNA/core biopsy 9/27 at bedside  pending.  P:  Dr. Marin Olp following Trending Hgb w/ CBC daily Trending Coagulopathy SCDs Decadron 40mg  IV q24hr  INFECTIOUS A: Sepsis - Possible CAP  P:   Resp ctx 9/27>>> BCx2 9/25>>> UC 9/25>>>negative  Vancomycin 9/25>>> Zosyn 9/25>>>  ENDOCRINE A: Hypoglycemia   P:   CBG q4 hr w/ parameters to notify MD  NEUROLOGIC A: Altered Mental Status - Toxic Metabolic Encephalopathy versus Dilaudid induced  Back pain - unclear etiology.  P:   MRI spine negative Fentanyl gtt for pain relief Versed IV prn for RASS goal:  0 to -1  FAMILY  - Updates:  Wife updated at bedside 9/28 by me.  TODAY'S SUMMARY: 54 year old male with left neck mass and concern for lymphoma. Intubated on 9/26 for worsening hypoxic respiratory failure. Continuing on vasopressor & inotropic support. Suspect this is significant amount of cardiac augmentation from positive pressure ventilation. Wife updated again at bedside today by me and relayed this concern. Plan to place OGT & begin tube feedings today.  I have spent a total of 39 minutes of critical care time today caring for the patient, updating the patient's wife at bedside, & reviewing the patient's electronic medical record.  Sonia Baller Ashok Cordia, M.D. [facility] Pager:  [phone] After 3pm or if no response, call [phone]  09/27/2015  11:22 AM

## 2015-09-27 NOTE — Progress Notes (Signed)
eLink Physician-Brief Progress Note Patient Name: Paul Morton DOB: January 07, 1961 MRN: [id number]   Date of Service  09/27/2015  HPI/Events of Note  Nonsustained VT runs Lytes sent  eICU Interventions  supp k , ca  send trop        Raylene Miyamoto. 09/27/2015, 2:26 AM

## 2015-09-27 NOTE — Progress Notes (Signed)
[facility] Progress Note    Assessment/ Plan:   1. AKI: Multifactorial in setting of volume depletion, NSAID use, likely rhabdomyolysis (CK 1533 on admission). Cr still going up, 3.47>4.23>4.72>4.94>4.95 but hopefully has reached a plateau. UOP continues to be excellent with 3,100 ml over last 24 hours. CK trending down as well. Continue with IV Lasix 160 mg Q8H. Does not need CRRT at this time.  2. Metabolic Acidosis/Anion Gap Metabolic Acidosis: Bicarb 28 from 24 this morning while on sodium bicarb drip at 50 ml/hr. Can consider discontinuing drip. Lactic acidosis resolved, lactate 1.4 this AM.  3. Hyperkalemia: K 3.9 this AM. Resolved.  4. Lactic Acidosis: Lactic acid hs trended down from 8.4>2.2>1.4 this AM. Continue to monitor.  5. Hypocalcemia: Ca 5.8, with correction 7.4. Received calcium gluconate 1 g today. Continue to monitor.  6. Hyperphosphatemia: Ph 6.3, previously 6.2. In setting of acute renal failure. Continue to monitor.  7. Neck mass, likely lymphoma: Oncology following. Bedside FNA of left cervical lymph node 9/27.  8. 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 On milrinone and levophed drips. BPs in 11H-417E systolic. Possible CAP, on Zosyn currently. Blood and urine cultures negative so far. Respiratory culture pending. Management per HF team and PCCM.  9. UGIB: Secondary to erosive esophagitis. EGD performed yesterday (9/27). GI following. Currently on PPI drip for total of 72 hours and then can be switched to IV or PO.  10. Acute Bleeding in Setting of Iron Deficiency Anemia: Anemia panel on 9/13 shows low iron 12 and low saturation 5%. Ferritin elevated at 350 but also acute phase reactant in setting of likely malignancy. Baseline Hgb in 9-10 range as outpatient. Transfuse for Hgb < 7.0.   Subjective:   Overnight had some nonsustained VT runs. Remains intubated and sedated. UOP excellent. Lactic acidosis improved.    Objective:    BP 98/51 mmHg  Pulse 115  Temp(Src) 99.3 F (37.4 C) (Core (Comment))  Resp 23  Ht 5' 11.5" (1.816 m)  Wt 202 lb 13.2 oz (92 kg)  BMI 27.90 kg/m2  SpO2 99%  Intake/Output Summary (Last 24 hours) at 09/27/15 0755 Last data filed at 09/27/15 0700  Gross per 24 hour  Intake 4448.19 ml  Output   4400 ml  Net  48.19 ml   Weight change: -1 lb 8.7 oz (-0.7 kg)  Physical Exam: Gen: sedated on vent CVS: tachycardic, regular, +S3 Resp: coarse breath sounds bilaterally Abd: BS+, soft, non-tender Ext: trace-1+ bilateral lower extremity edema   Imaging: Mr Lumbar Spine Wo Contrast  09/07/2015   CLINICAL DATA:  Low back pain for 1 month unable to urinate. Critically ill. Renal failure. Recent diagnosis of lymphoma.  EXAM: MRI LUMBAR SPINE WITHOUT CONTRAST  TECHNIQUE: Multiplanar, multisequence MR imaging of the lumbar spine was performed. No intravenous contrast was administered.  COMPARISON:  CT abdomen and pelvis September 24, 2015  FINDINGS: Low signal to noise ratio.  Lumbar vertebral bodies and posterior elements are intact and aligned and maintenance of lumbar lordosis. Using the reference level of the last well-formed intervertebral disc as L5-S1, moderate to severe L5-S1 disc height loss with moderate to severe subacute discogenic endplate changes. No STIR signal abnormality to suggest acute osseous process. Moderate chronic discogenic endplate changes Y8-1. Mild desiccation L4-5 disc.  Conus medullaris terminates at L1-2 and appears normal morphology and signal characteristics. Cauda equina is unremarkable. Bright STIR signal within the paraspinal soft tissues without focal fluid collection.  Level by level  evaluation:  L1-2: Small to moderate broad-based disc bulge asymmetric to LEFT. No canal stenosis. Minimal LEFT neural foraminal narrowing.  L2-3: Small broad-based disc bulge. Mild facet arthropathy and ligamentum flavum redundancy without canal stenosis or neural foraminal narrowing.   L3-4: Small broad-based disc bulge. Mild facet arthropathy and ligamentum flavum redundancy without canal stenosis. Superimposed suspected small LEFT extra foraminal disc protrusion/extrusion. Mild to moderate RIGHT, moderate LEFT neural foraminal narrowing.  L4-5: Small broad-based disc bulge. Mild facet arthropathy and ligamentum flavum redundancy without canal stenosis. Mild to moderate LEFT neural foraminal narrowing.  L5-S1: Small broad-based disc bulge asymmetric to the RIGHT, superimposed small central disc protrusion and tiny central extrusion component suspected, best characterized on sagittal 8/14, without free fragment. Mild facet arthropathy without canal stenosis. Moderate RIGHT, mild LEFT neural foraminal narrowing.  IMPRESSION: Technically suboptimal examination.  No acute lumbar spine fracture, malalignment or suspicious signal.  Paraspinal muscle edema/denervation.  Degenerative change of the lumbar spine without canal stenosis. L3-4 through L5-S1 neural foraminal narrowing: Moderate on the LEFT at L3-4 and on the RIGHT at L5-S1.  Small suspected contiguous central L5-S1 disc extrusion. Possible small LEFT extra foraminal L3-4 disc protrusion/extrusion could affect the exited LEFT L4 nerve.   Electronically Signed   By: Elon Alas M.D.   On: 08/31/2015 04:04   US Biopsy  09/09/2015   CLINICAL DATA:  54 year old male with a history of multiple left-sided neck mass, likely lymphadenopathy and suspicious for lymphoma.  He is currently in the ICU, and has elevated INR.  EXAM: ULTRASOUND GUIDED CORE BIOPSY OF LEFT CERVICAL ADENOPATHY  MEDICATIONS: None  PROCEDURE: The procedure, risks, benefits, and alternatives were explained to the patient. Questions regarding the procedure were encouraged and answered. The patient understands and consents to the procedure.  Ultrasound survey was performed with images stored and sent to PACs.  The left neck was prepped with chlorhexidine in a sterile fashion,  and a sterile drape was applied covering the operative field. A sterile gown and sterile gloves were used for the procedure. Local anesthesia was provided with 1% Lidocaine.  Ultrasound guidance was used for generous infiltration of the skin and subcutaneous tissues for local anesthesia. Ultrasound guidance was then used to day rect an 18 gauge biopsy gun into selected lymph node of the left cervical region. Four separate 18 gauge core biopsy were retrieved.  Specimen placed in the saline.  Final image was stored.  Patient tolerated the procedure well and remained hemodynamically stable throughout.  No complications were encountered and no significant blood loss was encounter  COMPLICATIONS: None.  FINDINGS: Ultrasound survey demonstrates multiple pathologic lymph nodes of the left cervical region.  Images during the case demonstrate needle tip within the selected node on each pass.  Final image demonstrates no complicating features.  IMPRESSION: Status post ultrasound-guided core biopsy of left-sided cervical adenopathy. Tissue specimen sent to pathology for complete histopathologic analysis.  Signed,  Dulcy Fanny. Earleen Newport, DO  [facility]  [facility]   Electronically Signed   By: Corrie Mckusick D.O.   On: 09/15/2015 15:15   Dg Chest Port 1 View  09/25/2015   CLINICAL DATA:  Respiratory insufficiency. Evaluate ET tube placement.  EXAM: PORTABLE CHEST 1 VIEW  COMPARISON:  09/25/2015  FINDINGS: There is a right IJ catheter with tip in the cavoatrial junction. ET tube tip is above the carina. There is a nasogastric tube with tip in the stomach. Cardiac enlargement noted. Bilateral pulmonary opacities are again noted  compatible with edema and/or multifocal infection.  IMPRESSION: 1. Persistent bilateral pulmonary opacities compatible with pneumonia and/or edema.   Electronically Signed   By: Kerby Moors M.D.   On: 09/25/2015 13:31    Labs: BMET  Recent Labs Lab  09/02/2015 1632 09/08/2015 1656 09/29/2015 1935 09/15/2015 2237 09/25/15 0400 09/25/15 1524 09/05/2015 0540 09/27/15 0152 09/27/15 0522  NA 136 132*  --  137 139 140 139 140 139  K 6.4* 6.0*  --  5.8* 6.2* 5.5* 4.3 3.6 3.9  CL 97* 102  --  103 103 103 100* 96* 95*  CO2 10*  --   --  13* 14* 18* 24 27 28   GLUCOSE 65 58*  --  114* 145* 148* 136* 174* 161*  BUN 89* 80*  --  92* 96* 106* 113* 120* 118*  CREATININE 3.43* 3.50*  --  3.19* 3.47* 4.23* 4.72* 4.94* 4.95*  CALCIUM 8.5*  --   --  7.6* 7.3* 7.0* 6.0* 5.9* 5.8*  PHOS  --   --  11.0*  --  11.1*  --  7.1* 6.2* 6.3*   CBC  Recent Labs Lab 09/19/2015 1632  09/25/15 0400 09/25/15 1524 09/09/2015 0539 09/27/15 0522  WBC 18.6*  --  24.8* 15.0* 14.2* 23.0*  NEUTROABS 16.5*  --   --  13.7* 13.3* 21.9*  HGB 11.8*  < > 10.9* 11.0* 9.9* 9.7*  HCT 38.9*  < > 35.9* 34.9* 31.0* 30.3*  MCV 79.2  --  79.6 78.3 75.4* 74.6*  PLT 383  --  335 327 258 192  < > = values in this interval not displayed.  Medications:    . sodium chloride   Intravenous Once  . dexamethasone (DECADRON) IVPB [facility]  40 mg Intravenous Q24H  . fentaNYL (SUBLIMAZE) injection  50 mcg Intravenous Once  . furosemide  160 mg Intravenous 3 times per day  . ipratropium-albuterol  3 mL Nebulization Q6H  . piperacillin-tazobactam (ZOSYN)  IV  3.375 g Intravenous Q8H      Albin Felling, MD, MPH Internal Medicine Resident, PGY-II Pager: [phone]   09/27/2015, 7:55 AM

## 2015-09-28 ENCOUNTER — Encounter (HOSPITAL_COMMUNITY): Payer: Self-pay | Admitting: Gastroenterology

## 2015-09-28 DIAGNOSIS — I429 Cardiomyopathy, unspecified: Secondary | ICD-10-CM

## 2015-09-28 DIAGNOSIS — C8198 Hodgkin lymphoma, unspecified, lymph nodes of multiple sites: Secondary | ICD-10-CM

## 2015-09-28 DIAGNOSIS — K922 Gastrointestinal hemorrhage, unspecified: Secondary | ICD-10-CM

## 2015-09-28 DIAGNOSIS — R579 Shock, unspecified: Secondary | ICD-10-CM

## 2015-09-28 HISTORY — DX: Hodgkin lymphoma, unspecified, lymph nodes of multiple sites: C81.98

## 2015-09-28 LAB — CBC WITH DIFFERENTIAL/PLATELET
Basophils Absolute: 0 10*3/uL (ref 0.0–0.1)
Basophils Relative: 0 %
Eosinophils Absolute: 0 10*3/uL (ref 0.0–0.7)
Eosinophils Relative: 0 %
HEMATOCRIT: 30 % — AB (ref 39.0–52.0)
HEMOGLOBIN: 9.6 g/dL — AB (ref 13.0–17.0)
LYMPHS ABS: 0.5 10*3/uL — AB (ref 0.7–4.0)
LYMPHS PCT: 2 %
MCH: 23.6 pg — AB (ref 26.0–34.0)
MCHC: 32 g/dL (ref 30.0–36.0)
MCV: 73.9 fL — ABNORMAL LOW (ref 78.0–100.0)
MONOS PCT: 3 %
Monocytes Absolute: 0.8 10*3/uL (ref 0.1–1.0)
NEUTROS ABS: 24 10*3/uL — AB (ref 1.7–7.7)
Neutrophils Relative %: 95 %
Platelets: 125 10*3/uL — ABNORMAL LOW (ref 150–400)
RBC: 4.06 MIL/uL — ABNORMAL LOW (ref 4.22–5.81)
RDW: 17.8 % — ABNORMAL HIGH (ref 11.5–15.5)
WBC: 25.3 10*3/uL — ABNORMAL HIGH (ref 4.0–10.5)

## 2015-09-28 LAB — RENAL FUNCTION PANEL
ANION GAP: 18 — AB (ref 5–15)
Albumin: 1.9 g/dL — ABNORMAL LOW (ref 3.5–5.0)
BUN: 124 mg/dL — ABNORMAL HIGH (ref 6–20)
CHLORIDE: 94 mmol/L — AB (ref 101–111)
CO2: 31 mmol/L (ref 22–32)
Calcium: 6.2 mg/dL — CL (ref 8.9–10.3)
Creatinine, Ser: 5.34 mg/dL — ABNORMAL HIGH (ref 0.61–1.24)
GFR calc Af Amer: 13 mL/min — ABNORMAL LOW (ref >60)
GFR calc non Af Amer: 11 mL/min — ABNORMAL LOW (ref >60)
GLUCOSE: 164 mg/dL — AB (ref 65–99)
POTASSIUM: 3.3 mmol/L — AB (ref 3.5–5.1)
Phosphorus: 6.2 mg/dL — ABNORMAL HIGH (ref 2.5–4.6)
Sodium: 143 mmol/L (ref 135–145)

## 2015-09-28 LAB — FIBRINOGEN: Fibrinogen: 342 mg/dL (ref 204–475)

## 2015-09-28 LAB — URIC ACID: URIC ACID, SERUM: 1.2 mg/dL — AB (ref 4.4–7.6)

## 2015-09-28 LAB — GLUCOSE, CAPILLARY
Glucose-Capillary: 149 mg/dL — ABNORMAL HIGH (ref 65–99)
Glucose-Capillary: 164 mg/dL — ABNORMAL HIGH (ref 65–99)
Glucose-Capillary: 160 mg/dL — ABNORMAL HIGH (ref 65–99)
Glucose-Capillary: 153 mg/dL — ABNORMAL HIGH (ref 65–99)
Glucose-Capillary: 146 mg/dL — ABNORMAL HIGH (ref 65–99)

## 2015-09-28 LAB — LACTATE DEHYDROGENASE: LDH: 1238 U/L — AB (ref 98–192)

## 2015-09-28 LAB — APTT: aPTT: 29 seconds (ref 24–37)

## 2015-09-28 LAB — HEPATIC FUNCTION PANEL
ALK PHOS: 160 U/L — AB (ref 38–126)
ALT: 190 U/L — AB (ref 17–63)
AST: 151 U/L — AB (ref 15–41)
Albumin: 2 g/dL — ABNORMAL LOW (ref 3.5–5.0)
BILIRUBIN DIRECT: 4.2 mg/dL — AB (ref 0.1–0.5)
BILIRUBIN TOTAL: 6.4 mg/dL — AB (ref 0.3–1.2)
Indirect Bilirubin: 2.2 mg/dL — ABNORMAL HIGH (ref 0.3–0.9)
Total Protein: 5.8 g/dL — ABNORMAL LOW (ref 6.5–8.1)

## 2015-09-28 LAB — PROTIME-INR
INR: 1.72 — AB (ref 0.00–1.49)
Prothrombin Time: 20.1 seconds — ABNORMAL HIGH (ref 11.6–15.2)

## 2015-09-28 LAB — MAGNESIUM: Magnesium: 2.4 mg/dL (ref 1.7–2.4)

## 2015-09-28 MED ORDER — POTASSIUM CHLORIDE 10 MEQ/50ML IV SOLN
10.0000 meq | INTRAVENOUS | Status: AC
  Administered 2015-09-28 (×2): 10 meq via INTRAVENOUS
  Filled 2015-09-28 (×2): qty 50

## 2015-09-28 MED ORDER — NICOTINE 7 MG/24HR TD PT24
7.0000 mg | MEDICATED_PATCH | Freq: Every day | TRANSDERMAL | Status: DC
  Administered 2015-09-28 – 2015-10-03 (×6): 7 mg via TRANSDERMAL
  Filled 2015-09-28 (×7): qty 1

## 2015-09-28 MED ORDER — PANTOPRAZOLE SODIUM 40 MG IV SOLR
40.0000 mg | Freq: Two times a day (BID) | INTRAVENOUS | Status: DC
  Administered 2015-09-28 – 2015-09-29 (×2): 40 mg via INTRAVENOUS
  Filled 2015-09-28 (×3): qty 40

## 2015-09-28 MED ORDER — ALLOPURINOL 20 MG/ML ORAL SUSPENSION
100.0000 mg | Freq: Every day | ORAL | Status: DC
  Administered 2015-09-28: 100 mg
  Filled 2015-09-28 (×5): qty 5

## 2015-09-28 MED ORDER — PIPERACILLIN-TAZOBACTAM IN DEX 2-0.25 GM/50ML IV SOLN
2.2500 g | Freq: Three times a day (TID) | INTRAVENOUS | Status: DC
  Administered 2015-09-28 – 2015-10-02 (×12): 2.25 g via INTRAVENOUS
  Filled 2015-09-28 (×13): qty 50

## 2015-09-28 MED ORDER — ACYCLOVIR 200 MG/5ML PO SUSP
400.0000 mg | Freq: Two times a day (BID) | ORAL | Status: DC
  Administered 2015-09-28: 400 mg
  Filled 2015-09-28 (×3): qty 10

## 2015-09-28 MED ORDER — VITAL HIGH PROTEIN PO LIQD
1000.0000 mL | ORAL | Status: DC
  Administered 2015-09-28: 1000 mL
  Filled 2015-09-28 (×2): qty 1000

## 2015-09-28 MED ORDER — VITAL AF 1.2 CAL PO LIQD
1000.0000 mL | ORAL | Status: DC
  Administered 2015-09-29: 1000 mL
  Filled 2015-09-28 (×5): qty 1000

## 2015-09-28 MED ORDER — PRO-STAT SUGAR FREE PO LIQD
30.0000 mL | Freq: Two times a day (BID) | ORAL | Status: AC
  Administered 2015-09-28 (×2): 30 mL
  Filled 2015-09-28 (×2): qty 30

## 2015-09-28 MED ORDER — SODIUM CHLORIDE 0.9 % IV SOLN
1.0000 g | Freq: Once | INTRAVENOUS | Status: AC
  Administered 2015-09-28: 1 g via INTRAVENOUS
  Filled 2015-09-28: qty 10

## 2015-09-28 MED ORDER — DOBUTAMINE IN D5W 4-5 MG/ML-% IV SOLN
5.0000 ug/kg/min | INTRAVENOUS | Status: DC
  Administered 2015-09-28 – 2015-10-06 (×5): 5 ug/kg/min via INTRAVENOUS
  Filled 2015-09-28 (×7): qty 250

## 2015-09-28 NOTE — Progress Notes (Signed)
Advanced Heart Failure Team Rounding Note  Referring Physician: Dr Vaughan Browner Primary Physician: Primary Cardiologist:  None Oncologist: Dr Marin Olp  Reason for Consultation: Acute Systolic Heart Failure   HPI:    Paul Morton is a 54 year old male with h/o tobacco and ETOH abuse who recently developed large cervical adenopathy and intractable back pain. Underwent PET scan earlier this month which showed hypermetabolic left cervical lymphadenopathy involving levels II-IV and presence of hepatomegaly with diffuse liver hypermetabolism suggesting probably lymphoma.   Admitted 9/26 with multisystem organ failure. Echo performed and EF 10-15% with moderate RV dysfunction. Initial co-ox drawn was 55%. Started on milrinone. Intubated and transferred to [facility]. Developed large GI bleed. EGDwith esophagitis.   Underwent core biopsy of cervical node 9/27.   Remains on milrinone 0.25  and norepi (down to 8). CVP 6. Urine output brisk. Co-ox down to 57% Cr 4.9 -> 5.3  No further GI bleeding     Objective:    Vital Signs:   Temp:  [97.6 F (36.4 C)-99.2 F (37.3 C)] 98 F (36.7 C) (09/29 0900) Pulse Rate:  [96-122] 98 (09/29 1139) Resp:  [21-28] 26 (09/29 1139) BP: (98-123)/(44-67) 98/52 mmHg (09/29 1139) SpO2:  [94 %-100 %] 98 % (09/29 1139) FiO2 (%):  [30 %] 30 % (09/29 1139) Weight:  [90.3 kg (199 lb 1.2 oz)] 90.3 kg (199 lb 1.2 oz) (09/29 0500)    Weight change: Filed Weights   09/23/2015 0500 09/27/15 0500 09/28/15 0500  Weight: 92.7 kg (204 lb 5.9 oz) 92 kg (202 lb 13.2 oz) 90.3 kg (199 lb 1.2 oz)    Intake/Output:   Intake/Output Summary (Last 24 hours) at 09/28/15 1245 Last data filed at 09/28/15 0900  Gross per 24 hour  Intake 3005.54 ml  Output   4925 ml  Net -1919.46 ml     Physical Exam: General:  Intubated awake on vent/agitated at times HEENT: normal except for ETT Neck: supple. RIJ TLC . Prominent left neck lymphadenopathy Cor: PMI laterally displaced. Tachy  regular + s3 Lungs: clear Abdomen: soft, nontender, nondistended. No bruits or masses. Good bowel sounds. Extremities: no cyanosis, clubbing, rash, no edema Neuro: intubated sedated  Telemetry: sinus tach 100-120  Labs: Basic Metabolic Panel:  Recent Labs Lab 09/25/15 0400 09/25/15 1524 09/10/2015 0539 09/01/2015 0540 09/27/15 0152 09/27/15 0522 09/28/15 0319 09/28/15 0347  NA 139 140  --  139 140 139  --  143  K 6.2* 5.5*  --  4.3 3.6 3.9  --  3.3*  CL 103 103  --  100* 96* 95*  --  94*  CO2 14* 18*  --  24 27 28   --  31  GLUCOSE 145* 148*  --  136* 174* 161*  --  164*  BUN 96* 106*  --  113* 120* 118*  --  124*  CREATININE 3.47* 4.23*  --  4.72* 4.94* 4.95*  --  5.34*  CALCIUM 7.3* 7.0*  --  6.0* 5.9* 5.8*  --  6.2*  MG 2.8*  --  2.5*  --  2.5* 2.5* 2.4  --   PHOS 11.1*  --   --  7.1* 6.2* 6.3*  --  6.2*    Liver Function Tests:  Recent Labs Lab 09/25/15 0400 09/25/15 1524 09/17/2015 0539 09/06/2015 0540 09/27/15 0522 09/28/15 0319 09/28/15 0347  AST 701* 649* 516*  --  251* 151*  --   ALT 345* 347* 290*  --  226* 190*  --   ALKPHOS  230* 215* 173*  --  169* 160*  --   BILITOT 4.3* 5.1* 5.8*  --  7.1* 6.4*  --   PROT 5.8* 5.2* 5.2*  --  5.4* 5.8*  --   ALBUMIN 2.3* 2.0* 1.9* 1.8* 2.0*  2.0* 2.0* 1.9*   No results for input(s): LIPASE, AMYLASE in the last 168 hours.  Recent Labs Lab 09/25/15 1523  AMMONIA 37*    CBC:  Recent Labs Lab 09/28/2015 1632  09/25/15 0400 09/25/15 1524 09/19/2015 0539 09/27/15 0522 09/28/15 0319  WBC 18.6*  --  24.8* 15.0* 14.2* 23.0* 25.3*  NEUTROABS 16.5*  --   --  13.7* 13.3* 21.9* 24.0*  HGB 11.8*  < > 10.9* 11.0* 9.9* 9.7* 9.6*  HCT 38.9*  < > 35.9* 34.9* 31.0* 30.3* 30.0*  MCV 79.2  --  79.6 78.3 75.4* 74.6* 73.9*  PLT 383  --  335 327 258 192 125*  < > = values in this interval not displayed.  Cardiac Enzymes:  Recent Labs Lab 09/17/2015 1630 09/25/15 0400 09/25/15 0800 09/25/15 1524 08/31/2015 0539 09/27/15 0152    CKTOTAL 1533* 1111* 1052* 938* 674*  --   CKMB  --   --  73.5*  --   --   --   TROPONINI  --   --   --   --   --  0.09*    BNP: BNP (last 3 results) No results for input(s): BNP in the last 8760 hours.  ProBNP (last 3 results) No results for input(s): PROBNP in the last 8760 hours.   CBG:  Recent Labs Lab 09/27/15 1927 09/27/15 2347 09/28/15 0330 09/28/15 0813 09/28/15 1152  GLUCAP 152* 164* 149* 160* 153*    Coagulation Studies:  Recent Labs  09/25/15 1524 09/01/2015 0539 09/08/2015 1215 09/27/15 0522 09/28/15 0319  LABPROT 28.6* 23.4* 22.3* 20.2* 20.1*  INR 2.75* 2.10* 1.97* 1.73* 1.72*    Other results: EKG: ST 107 RBBB/LAFB  Imaging: Dg Abd Portable 1v  09/27/2015   CLINICAL DATA:  Enteric tube placement  EXAM: PORTABLE ABDOMEN - 1 VIEW  COMPARISON:  09/21/2015 CT abdomen/pelvis .  FINDINGS: Enteric tube terminates in the body of the stomach. No dilated small bowel loops. No evidence of pneumatosis or pneumoperitoneum. Vascular calcifications are noted in the pelvis. Patchy opacity is present at the left lung base. Degenerative changes throughout the visualized thoracolumbar spine.  IMPRESSION: Enteric tube terminates in the body of the stomach. Nonobstructive bowel gas pattern.   Electronically Signed   By: Ilona Sorrel M.D.   On: 09/27/2015 13:43     Medications:     Current Medications: . antiseptic oral rinse  7 mL Mouth Rinse QID  . chlorhexidine gluconate  15 mL Mouth Rinse BID  . dexamethasone (DECADRON) IVPB [facility]  40 mg Intravenous Q24H  . feeding supplement (PRO-STAT SUGAR FREE 64)  30 mL Per Tube BID  . fentaNYL (SUBLIMAZE) injection  50 mcg Intravenous Once  . furosemide  160 mg Intravenous 3 times per day  . ipratropium-albuterol  3 mL Nebulization Q6H  . [START ON 09/29/2015] pantoprazole (PROTONIX) IV  40 mg Intravenous Q12H  . piperacillin-tazobactam (ZOSYN)  IV  2.25 g Intravenous Q8H    Infusions: . sodium chloride 10 mL/hr at 09/28/15  0900  . feeding supplement (VITAL AF 1.2 CAL)    . fentaNYL infusion INTRAVENOUS 300 mcg/hr (09/28/15 0900)  . milrinone 0.25 mcg/kg/min (09/28/15 0900)  . norepinephrine (LEVOPHED) Adult infusion 8 mcg/min (09/28/15 0900)  .  pantoprozole (PROTONIX) infusion 8 mg/hr (09/28/15 0900)     Assessment:   1. Cardiogenic shock 2. Acute systolic HF EF 02-11% 3. Acute respiratory failure 4. Multisystem organ failure 5. Acute kidney injury 6. Shock liver 7. Hyperkalemia 8. 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 Tobacco use 10. ETOH use - 4+ beers per day 11. Family h/o NICM in brother 84. Massive upper GI bleed     --severe esophagitis by EGD on 9/27 13. Coagulopathy - due t shock liver with ? DIC  Plan/Discussion:    Remains on vent and dual pressors. Good urine output but creatinine has now bounced back up. CVP 6 . Would stop lasix. Co-ox of 57% is marginal. With renal failure and marginal co-ox will switch milrinone to dobutamine 5. Watch HR. Wean norepi as tolerated.   Path results appear to be Hodgkin's. Agree with Dr. Marin Olp that adriamycin is not a possibility. May be able to use alternate agent as he stabilizes.    We will follow continue to follow closely.   The patient is critically ill with multiple organ systems failure and requires high complexity decision making for assessment and support, frequent evaluation and titration of therapies, application of advanced monitoring technologies and extensive interpretation of multiple databases.   Critical Care Time devoted to patient care services described in this note is 35 Minutes.   Bensimhon, Daniel,MD  Advanced Heart Failure Team Pager [phone] (M-F; 7a - 4p)  Please contact [facility] for night-coverage after hours (4p -7a ) and weekends on [email or web address]

## 2015-09-28 NOTE — Progress Notes (Addendum)
Given the dx of Hodgkin's disease, we MUST move along with treatment. It is hard to know how much of his overall clinical problem the Hodgkin's is causing. However, the only way that we will know is by treating his disease.  Unfortunately, we are not able to use chemotherapy. His cardiac function, renal function, and to some degree of hepatic function would make chemotherapy very difficult to use.  I think the one option that we have is to initiate his therapy is Adcetris. This is a monoclonal antibody therapy.  I spoke to his wife. She is incredibly knowledgeable.  I explained to her the situation that we have. Adcetris is not approved for frontline Hodgkin's disease therapy but I think in his situation, he has such extenuating circumstances that Adcetris would be approved for his use.  With Adcetris, there should be very little in the way of toxicity. I realize that he has very poor renal function and as such, I am reducing the dose of Adcetris.  In talking to his wife, I explained to her how Adcetris works. I explained to her that the one side effect that we might have to worry about would be neuropathy. I told her that I do not think that it would really affect his blood counts. He should not lose his hair. Some people may have a rash with it. Hopefully, after 1 cycle, he will be in a situation that he will be able to take chemotherapy.  After talking to his wife, she agrees to have him receive the Adcetris. I told her that I thought the chance of responding would be about 90%. Again, the only way we will know if the Hodgkin's disease is causing his other concurrent problems is to treat him and as the Hodgkin's improves, then possibly his other issues will also improve.  His nurse was sitting next to me well I was talking to his wife. He had a very good understanding of the situation.  I told his wife that we will get started with treatment tomorrow. Treatment would be one day every 3 weeks.  Hopefully, we will need one maybe 2 treatments and then switch over to chemotherapy depending on his overall health status.  Clearly, I think that his prognosis is going to be dependent upon his cardiac status.  I told his wife that if he ever has any questions, she can always call us.  Pete E.

## 2015-09-28 NOTE — Progress Notes (Signed)
Nutrition Follow-up / Consult  DOCUMENTATION CODES:   Not applicable  INTERVENTION:    Initiate TF via OGT with Vital AF 1.2 at 25 ml/h and Prostat 30 ml BID on day 1; on day 2, d/c Prostat and increase to goal rate of 75 ml/h (1800 ml per day) to provide 2160 kcals, 135 gm protein, 1460 ml free water daily.  NUTRITION DIAGNOSIS:   Inadequate oral intake related to inability to eat as evidenced by NPO status.  GOAL:   Patient will meet greater than or equal to 90% of their needs  MONITOR:   Vent status, Weight trends, Labs, I & O's  REASON FOR ASSESSMENT:   Consult Enteral/tube feeding initiation and management  ASSESSMENT:   54yo male smoker with recent diagnosis lymphoma (still in early stages of workup) who presented 9/25 with progressive back pain. In ER found to be tachycardic, hypotensive with lactate 14, AKI and coagulopathy.   Labs reviewed: potassium low, phosphorus elevated  Received MD Consult for TF initiation and management. Patient is currently intubated on ventilator support MV: 15.5 L/min Temp (24hrs), Avg:98.5 F (36.9 C), Min:97.6 F (36.4 C), Max:99.2 F (37.3 C)   Diet Order:  Diet NPO time specified Except for: Sips with Meds  Skin:  Reviewed, no issues  Last BM:  unknown  Height:   Ht Readings from Last 1 Encounters:  09/25/15 5' 11.5" (1.816 m)    Weight:   Wt Readings from Last 1 Encounters:  09/28/15 199 lb 1.2 oz (90.3 kg)    Ideal Body Weight:  79.5 kg  BMI:  Body mass index is 27.38 kg/(m^2).  Estimated Nutritional Needs:   Kcal:  2195  Protein:  130-145 gm  Fluid:  2.2-2.4 L  EDUCATION NEEDS:   No education needs identified at this time   Molli Barrows, [place], [place], [place] Pager [phone] After Hours Pager [phone]

## 2015-09-28 NOTE — Progress Notes (Signed)
  Received call from RN regarding critical lab value Ca 6.2 and notified eMD.

## 2015-09-28 NOTE — Care Management Note (Signed)
Case Management Note  Patient Details  Name: Paul Morton MRN: [id number] Date of Birth: 01-22-1961  Subjective/Objective:    Prior to admission home with wife, independent.  Will follow for progression to assist with determining dc needs.                 Action/Plan:   Expected Discharge Date:   (unknown)               Expected Discharge Plan:  Home/Self Care  In-House Referral:     Discharge planning Services  CM Consult  Post Acute Care Choice:    Choice offered to:     DME Arranged:    DME Agency:     HH Arranged:    HH Agency:     Status of Service:  In process, will continue to follow  Medicare Important Message Given:    Date Medicare IM Given:    Medicare IM give by:    Date Additional Medicare IM Given:    Additional Medicare Important Message give by:     If discussed at [place] of Stay Meetings, dates discussed:    Additional Comments:  Vergie Living, RN 09/28/2015, 9:29 AM

## 2015-09-28 NOTE — Progress Notes (Signed)
Daily Rounding Note  09/28/2015, 10:28 AM  LOS: 4 days   SUBJECTIVE:       No stools recorded.  Pt not c/o abdominal pain.  Wants ETT out.  Per family, pt waiting on repeat echo to determine if he will be extubated. Continues on multiple drips: PPI, fentanyl, milrinone, levophed, bicarb.   OBJECTIVE:         Vital signs in last 24 hours:    Temp:  [97.6 F (36.4 C)-99.2 F (37.3 C)] 98 F (36.7 C) (09/29 0900) Pulse Rate:  [96-122] 100 (09/29 0900) Resp:  [21-29] 26 (09/29 0900) BP: (79-123)/(43-73) 104/58 mmHg (09/29 0900) SpO2:  [94 %-100 %] 98 % (09/29 0900) FiO2 (%):  [30 %-35 %] 30 % (09/29 0800) Weight:  [199 lb 1.2 oz (90.3 kg)] 199 lb 1.2 oz (90.3 kg) (09/29 0500)   Filed Weights   09/01/2015 0500 09/27/15 0500 09/28/15 0500  Weight: 204 lb 5.9 oz (92.7 kg) 202 lb 13.2 oz (92 kg) 199 lb 1.2 oz (90.3 kg)   General: intubated, awakens easily,    Heart: RRR Chest: clear in front.  ETT in place Abdomen: soft, hypoactive BS, not tender or distended  Extremities: trace pedal edema Neuro/Psych:  Follows commands, moves all limbs.  A bit agitated at times.   Intake/Output from previous day: 09/28 0701 - 09/29 0700 In: 3323.8 [I.V.:2893.8; NG/GT:30; IV Piggyback:400] Out: 5750 [Urine:5750]  Intake/Output this shift: Total I/O In: 292.2 [I.V.:262.2; NG/GT:30] Out: 500 [Urine:500]  Lab Results:  Recent Labs  09/10/2015 0539 09/27/15 0522 09/28/15 0319  WBC 14.2* 23.0* 25.3*  HGB 9.9* 9.7* 9.6*  HCT 31.0* 30.3* 30.0*  PLT 258 192 125*   BMET  Recent Labs  09/27/15 0152 09/27/15 0522 09/28/15 0347  NA 140 139 143  K 3.6 3.9 3.3*  CL 96* 95* 94*  CO2 27 28 31   GLUCOSE 174* 161* 164*  BUN 120* 118* 124*  CREATININE 4.94* 4.95* 5.34*  CALCIUM 5.9* 5.8* 6.2*   LFT  Recent Labs  09/05/2015 0539  09/27/15 0522 09/28/15 0319 09/28/15 0347  PROT 5.2*  --  5.4* 5.8*  --   ALBUMIN 1.9*  < > 2.0*   2.0* 2.0* 1.9*  AST 516*  --  251* 151*  --   ALT 290*  --  226* 190*  --   ALKPHOS 173*  --  169* 160*  --   BILITOT 5.8*  --  7.1* 6.4*  --   BILIDIR 4.0*  --  5.0* 4.2*  --   IBILI 1.8*  --  2.1* 2.2*  --   < > = values in this interval not displayed. PT/INR  Recent Labs  09/27/15 0522 09/28/15 0319  LABPROT 20.2* 20.1*  INR 1.73* 1.72*   Hepatitis Panel  Recent Labs  09/25/15 1521 09/25/15 1524  HEPBSAG Negative  --   HCVAB  --  <0.1  HEPAIGM  --  Negative    Studies/Results: US Biopsy  09/22/2015   CLINICAL DATA:  54 year old male with a history of multiple left-sided neck mass, likely lymphadenopathy and suspicious for lymphoma.  He is currently in the ICU, and has elevated INR.  EXAM: ULTRASOUND GUIDED CORE BIOPSY OF LEFT CERVICAL ADENOPATHY  MEDICATIONS: None  PROCEDURE: The procedure, risks, benefits, and alternatives were explained to the patient. Questions regarding the procedure were encouraged and answered. The patient understands and consents to the procedure.  Ultrasound survey was performed with  images stored and sent to PACs.  The left neck was prepped with chlorhexidine in a sterile fashion, and a sterile drape was applied covering the operative field. A sterile gown and sterile gloves were used for the procedure. Local anesthesia was provided with 1% Lidocaine.  Ultrasound guidance was used for generous infiltration of the skin and subcutaneous tissues for local anesthesia. Ultrasound guidance was then used to day rect an 18 gauge biopsy gun into selected lymph node of the left cervical region. Four separate 18 gauge core biopsy were retrieved.  Specimen placed in the saline.  Final image was stored.  Patient tolerated the procedure well and remained hemodynamically stable throughout.  No complications were encountered and no significant blood loss was encounter  COMPLICATIONS: None.  FINDINGS: Ultrasound survey demonstrates multiple pathologic lymph nodes of the left  cervical region.  Images during the case demonstrate needle tip within the selected node on each pass.  Final image demonstrates no complicating features.  IMPRESSION: Status post ultrasound-guided core biopsy of left-sided cervical adenopathy. Tissue specimen sent to pathology for complete histopathologic analysis.  Signed,  Dulcy Fanny. Earleen Newport, DO  [facility]  [facility]   Electronically Signed   By: Corrie Mckusick D.O.   On: 09/22/2015 15:15   Dg Abd Portable 1v  09/27/2015   CLINICAL DATA:  Enteric tube placement  EXAM: PORTABLE ABDOMEN - 1 VIEW  COMPARISON:  09/14/2015 CT abdomen/pelvis .  FINDINGS: Enteric tube terminates in the body of the stomach. No dilated small bowel loops. No evidence of pneumatosis or pneumoperitoneum. Vascular calcifications are noted in the pelvis. Patchy opacity is present at the left lung base. Degenerative changes throughout the visualized thoracolumbar spine.  IMPRESSION: Enteric tube terminates in the body of the stomach. Nonobstructive bowel gas pattern.   Electronically Signed   By: Ilona Sorrel M.D.   On: 09/27/2015 13:43    ASSESMENT:   * CG emesis. On 72 hour PPI drip.  09/13/2015 EGD: erosive esophagitis with adherent old blood, small HH, retained gastric contents including solids (? Gastroparesis).   * Elevated LFTs.Suspect shock liver and possible lymphoma infiltration. Transaminases steadily improving. Liver homogeneous on CT 1/49, hypermetabolic liver fx on PET. Reported hx of drinking 6 pack beer per day. Hep A IgM, Hep B surface Ag, Hep B core Ab, HCV all negative.   * Coagulopathy. S/p 10 mg IV Vit K, 5 FFP thus far,  coags improved but not yet normal. ? Due to shock liver and DIC.   * ABL on chronic anemia. Microcytic. Low iron level 2 weeks ago.  Hgb stable following initial decline.   * Resp failure. Pneumonia. Sepsis. Intubated 9/26. Lactic acidosis resolved. WBCs rising. Blood clx  negative to date, yeast on resp clx.    * AKI.BUN/creat rising, no oliguria.    * Cardiomyopathy, EF 10 to 15%, cardiogenic shock. Troponin I 0.09. Elevated CK and CK MB. NSVT overnight.   * Neck masses, likely Lymphoma. Biopsy 9/27. Path pndg but prelim looks like Hodgkins. On Decadron for 1 more day.   * LE weakness and back pain. imcomplete MRI shows degenerative changes and disc protrusion at lumbar and cervical spine.   * Hypoalbuminemia. Currently NPO.    *  DM.     PLAN   *  Will sign off, call if needed.   *  PPI gtt completes tonight, start IV (or po if/when extubated) tomorrow. Once extubated, can assess swallow and advance diet as indicated  by SLP.     Paul Morton  09/28/2015, 10:28 AM Pager: [phone]

## 2015-09-28 NOTE — Progress Notes (Signed)
PULMONARY / CRITICAL CARE MEDICINE   Name: La Dibella MRN: [id number] DOB: 1961/12/18    ADMISSION DATE:  09/26/2015  REFERRING MD :  EDP  CHIEF COMPLAINT:  Sepsis   INITIAL PRESENTATION: 54yo male smoker with recent diagnosis lymphoma (still in early stages of workup) who presented 9/25 with progressive back pain.  In ER found to be tachycardic, hypotensive with lactate 14, AKI and coagulopathy.  PCCM called to admit.    STUDIES:  PET 9/22 - Bulky confluent hypermetabolic left cervical lymphadenopathy involving levels II-V. Given the presence of hepatomegaly with diffuse liver hypermetabolism, a diagnosis of lymphoma is favored, however metastatic carcinoma remains on the differential.  Hypermetabolic patchy ground-glass opacity and consolidation in both upper lobes and right lower lobe, favor an infectious or inflammatory etiology.  TTE 9/26 - EF 10-15% w/o regional wall motion abnormality. Grade 3 diastolic dysfunction. Moderate AR. Moderate MR. RV severely dilated w/ moderate reduction in systolic function. PASP 43 mmHg. No pericardial effusion.  RLE Venous Duplex 9/26 - no DVT or SVT  MRI spine 9/25 - incomplete exam, No evidence of significant spinal stenosis or cord compression in the cervical or visualized thoracic spine.  MRI L-spine 9/27 - suboptimal exam. No malignant or suspicious signal.  SIGNIFICANT EVENTS: 9/25 - Admitted to hospital 9/25 - Right IJ CVC by ED 9/26 - Intubated for pending respiratory failure & transferred to [facility] 9/27 - EGD w/ erosive esophagitis 9/27 - FNA neck mass  SUBJECTIVE: No acute events overnight. Patient remains on inotropic and vasopressor support.  REVIEW OF SYSTEMS: Unobtainable as the patient is currently intubated and sedated.  VITAL SIGNS: Temp:  [97.6 F (36.4 C)-99.2 F (37.3 C)] 98 F (36.7 C) (09/29 0900) Pulse Rate:  [96-122] 98 (09/29 1139) Resp:  [21-28] 26 (09/29 1139) BP: (89-123)/(44-67) 98/52 mmHg (09/29  1139) SpO2:  [94 %-100 %] 98 % (09/29 1139) FiO2 (%):  [30 %] 30 % (09/29 1139) Weight:  [199 lb 1.2 oz (90.3 kg)] 199 lb 1.2 oz (90.3 kg) (09/29 0500) HEMODYNAMICS: CVP:  [6 mmHg-9 mmHg] 6 mmHg VENTILATOR SETTINGS: Vent Mode:  [-] PRVC FiO2 (%):  [30 %] 30 % Set Rate:  [26 bmp] 26 bmp Vt Set:  [600 mL] 600 mL PEEP:  [5 cmH20] 5 cmH20 Plateau Pressure:  [18 cmH20-22 cmH20] 18 cmH20 INTAKE / OUTPUT:  Intake/Output Summary (Last 24 hours) at 09/28/15 1214 Last data filed at 09/28/15 0900  Gross per 24 hour  Intake 3011.54 ml  Output   4925 ml  Net -1913.46 ml    PHYSICAL EXAMINATION: General:  Sedated. Brother bedside today. No acute distress.  Integument:  Warm & dry. No rash on exposed skin.  HEENT:  Endotracheal tube in place. No scleral injection. Dressing over neck on left postbiopsy is clean and dry. Cardiovascular:  Regular rate. No edema.  Pulmonary: Coarse breath sounds bilaterally unchanged from yesterday. Symmetric chest wall rise on ventilator. Abdomen: Soft. Normal bowel sounds. Nondistended.  Neurological: Patient spontaneously moving all 4 extremities. Grossly nonfocal. No meningismus.  LABS:  CBC  Recent Labs Lab 09/22/2015 0539 09/27/15 0522 09/28/15 0319  WBC 14.2* 23.0* 25.3*  HGB 9.9* 9.7* 9.6*  HCT 31.0* 30.3* 30.0*  PLT 258 192 125*   Coag's  Recent Labs Lab 09/10/2015 1215 09/27/15 0522 09/28/15 0319  APTT 31 30 29   INR 1.97* 1.73* 1.72*   BMET  Recent Labs Lab 09/27/15 0152 09/27/15 0522 09/28/15 0347  NA 140 139 143  K 3.6 3.9  3.3*  CL 96* 95* 94*  CO2 27 28 31   BUN 120* 118* 124*  CREATININE 4.94* 4.95* 5.34*  GLUCOSE 174* 161* 164*   Electrolytes  Recent Labs Lab 09/27/15 0152 09/27/15 0522 09/28/15 0319 09/28/15 0347  CALCIUM 5.9* 5.8*  --  6.2*  MG 2.5* 2.5* 2.4  --   PHOS 6.2* 6.3*  --  6.2*   Sepsis Markers  Recent Labs Lab 09/04/2015 2237  09/25/15 0800 09/21/2015 0541 09/27/15 0524  LATICACIDVEN 8.6*  <  > 8.4* 2.2* 1.4  PROCALCITON 5.88  --   --   --   --   < > = values in this interval not displayed. ABG  Recent Labs Lab 09/25/15 0734 09/25/15 1310 09/25/15 1712  PHART 7.237* 7.162* 7.311*  PCO2ART 25.4* 41.4 34.8*  PO2ART 83.6 352* 78.1*   Liver Enzymes  Recent Labs Lab 09/21/2015 0539  09/27/15 0522 09/28/15 0319 09/28/15 0347  AST 516*  --  251* 151*  --   ALT 290*  --  226* 190*  --   ALKPHOS 173*  --  169* 160*  --   BILITOT 5.8*  --  7.1* 6.4*  --   ALBUMIN 1.9*  < > 2.0*  2.0* 2.0* 1.9*  < > = values in this interval not displayed. Cardiac Enzymes  Recent Labs Lab 09/27/15 0152  TROPONINI 0.09*   Glucose  Recent Labs Lab 09/27/15 1203 09/27/15 1538 09/27/15 1927 09/27/15 2347 09/28/15 0330 09/28/15 0813  GLUCAP 158* 148* 152* 164* 149* 160*    Imaging Dg Abd Portable 1v  09/27/2015   CLINICAL DATA:  Enteric tube placement  EXAM: PORTABLE ABDOMEN - 1 VIEW  COMPARISON:  09/12/2015 CT abdomen/pelvis .  FINDINGS: Enteric tube terminates in the body of the stomach. No dilated small bowel loops. No evidence of pneumatosis or pneumoperitoneum. Vascular calcifications are noted in the pelvis. Patchy opacity is present at the left lung base. Degenerative changes throughout the visualized thoracolumbar spine.  IMPRESSION: Enteric tube terminates in the body of the stomach. Nonobstructive bowel gas pattern.   Electronically Signed   By: Ilona Sorrel M.D.   On: 09/27/2015 13:43    ASSESSMENT / PLAN:  PULMONARY OEET 9/26>>> A: Acute hypoxic respiratory failure  Possible CAP H/O Tobacco Use  P:   Vent Bundle  Duonebs q6hr SBT on PS 0/0  CARDIOVASCULAR CVL R IJ CVL (EDP) 9/25>>> A: Nonischemic Cardiomyopathy Shock - Sepsis versus Cardiac in etiology  P:  Cardiology/Heart Failure Team consulted Milrinone gtt Levophed gtt to maintain MAP >65  RENAL A: Acute Renal Failure - Worsening function but improved output on Lasix. Mild Hypokalemia   Hyperkalemia - resolved. Hyponatremia - resolved  Metabolic Acidosis - resolved Lactic acidosis - resolved  P:   Nephrology following Lasix per nephrology Trending Lactic Acid Monitoring UOP with Foley catheter Trending daily electrolytes Discontinue bicarbonate drip  GASTROINTESTINAL A: Shock liver - improving Upper GIB - secondary to erosive esophagitis on EGD  P:   Initiating tube feedings per dietary recommendations Trending LFTs daily Protonix gtt for 3 days (9/29) then bid Protonix GI consulted - s/p EGD Hepatitis A/B/C negative on serum  HEMATOLOGIC Coagulopathy - Unclear etiology. Improving. S/P Vitamin K & 4u FFP. Leukocytosis - improving Anemia - Secondary to upper GIB Possible Lymphoma - FNA/core biopsy 9/27 at bedside pending.  P:  Dr. Marin Olp following Trending Hgb w/ CBC daily Trending Coagulopathy SCDs Decadron 40mg  IV q24hr  INFECTIOUS A: Sepsis - Possible CAP  P:  Resp ctx 9/27>>> BCx2 9/25>>> UC 9/25>>>negative  Vancomycin 9/25>>> Zosyn 9/25>>>  ENDOCRINE A: Hypoglycemia   P:   CBG q4 hr w/ parameters to notify MD  NEUROLOGIC A: Altered Mental Status - Toxic Metabolic Encephalopathy versus Dilaudid induced  Back pain - unclear etiology. MRI spine negative.  P:   MRI spine negative Fentanyl gtt for pain relief Versed IV prn for RASS goal:  0 to -1  FAMILY  - Updates:  Brother updated at bedside by me today.  TODAY'S SUMMARY: 54 year old male with left neck mass and concern for lymphoma. Intubated on 9/26 for worsening hypoxic respiratory failure. Continuing on vasopressor & inotropic support. Suspect this is significant amount of cardiac augmentation from positive pressure ventilation. Renal function has worsened slightly. Patient continues to require vasopressor & inotropic support.  I have spent a total of 34 minutes of critical care time today caring for the patient, updating the patient's brother, & reviewing the  patient's electronic medical record.  Sonia Baller Ashok Cordia, M.D. [facility]monary [facility] Critical Care Pager:  [phone] After 3pm or if no response, call [phone]  09/28/2015  12:14 PM

## 2015-09-28 NOTE — Progress Notes (Signed)
eLink Physician-Brief Progress Note Patient Name: Vilas Edgerly DOB: 1961-11-05 MRN: [id number]   Date of Service  09/28/2015  HPI/Events of Note  Confirmed placement of feeding tube.   eICU Interventions  Started tube feeds.      Intervention Category Minor Interventions: Routine modifications to care plan (e.g. PRN medications for pain, fever)  Laverle Hobby 09/28/2015, 12:42 AM

## 2015-09-28 NOTE — Progress Notes (Signed)
ANTIBIOTIC CONSULT NOTE - FOLLOW UP  Pharmacy Consult for Zosyn Indication: CAP   Allergies  Allergen Reactions  . Peanut-Containing Drug Products Anaphylaxis, Shortness Of Breath and Nausea And Vomiting    Patient Measurements: Height: 5' 11.5" (181.6 cm) Weight: 199 lb 1.2 oz (90.3 kg) IBW/kg (Calculated) : 76.45 As of 09/22/15: Height: 72 inches Weight: 87.5 kg  Vital Signs: Temp: 98 F (36.7 C) (09/29 0900) Temp Source: Core (Comment) (09/29 0700) BP: 104/58 mmHg (09/29 0900) Pulse Rate: 100 (09/29 0900) Intake/Output from previous day: 09/28 0701 - 09/29 0700 In: 3323.8 [I.V.:2893.8; NG/GT:30; IV Piggyback:400] Out: 5750 [Urine:5750] Intake/Output from this shift: Total I/O In: 292.2 [I.V.:262.2; NG/GT:30] Out: 500 [Urine:500]  Labs:  Recent Labs  09/03/2015 0539  09/27/15 0152 09/27/15 0522 09/28/15 0319 09/28/15 0347  WBC 14.2*  --   --  23.0* 25.3*  --   HGB 9.9*  --   --  9.7* 9.6*  --   PLT 258  --   --  192 125*  --   CREATININE  --   < > 4.94* 4.95*  --  5.34*  < > = values in this interval not displayed. Estimated Creatinine Clearance: 16.9 mL/min (by C-G formula based on Cr of 5.34). No results for input(s): VANCOTROUGH, VANCOPEAK, VANCORANDOM, GENTTROUGH, GENTPEAK, GENTRANDOM, TOBRATROUGH, TOBRAPEAK, TOBRARND, AMIKACINPEAK, AMIKACINTROU, AMIKACIN in the last 72 hours.   Microbiology: Recent Results (from the past 720 hour(s))  Blood Culture (routine x 2)     Status: None (Preliminary result)   Collection Time: 09/28/2015  6:20 PM  Result Value Ref Range Status   Specimen Description BLOOD CENTRAL LINE  Final   Special Requests BOTTLES DRAWN AEROBIC AND ANAEROBIC [place] EA  Final   Culture   Final    NO GROWTH 2 DAYS Performed at [facility]    Report Status PENDING  Incomplete  Urine culture     Status: None   Collection Time: 09/27/2015  8:06 PM  Result Value Ref Range Status   Specimen Description URINE, RANDOM  Final   Special Requests  NONE  Final   Culture   Final    NO GROWTH 1 DAY Performed at [facility]    Report Status 09/08/2015 FINAL  Final  MRSA PCR Screening     Status: None   Collection Time: 09/26/2015  9:23 PM  Result Value Ref Range Status   MRSA by PCR NEGATIVE NEGATIVE Final    Comment:        The GeneXpert MRSA Assay (FDA approved for NASAL specimens only), is one component of a comprehensive MRSA colonization surveillance program. It is not intended to diagnose MRSA infection nor to guide or monitor treatment for MRSA infections.   Blood Culture (routine x 2)     Status: None (Preliminary result)   Collection Time: 09/19/2015 10:37 PM  Result Value Ref Range Status   Specimen Description BLOOD RIGHT ARM  Final   Special Requests BOTTLES DRAWN AEROBIC AND ANAEROBIC 3CC  Final   Culture   Final    NO GROWTH 2 DAYS Performed at [facility]    Report Status PENDING  Incomplete  Culture, respiratory (NON-Expectorated)     Status: None (Preliminary result)   Collection Time: 09/27/15  3:04 AM  Result Value Ref Range Status   Specimen Description TRACHEAL ASPIRATE  Final   Special Requests Immunocompromised  Final   Gram Stain   Final    FEW WBC PRESENT,BOTH PMN AND MONONUCLEAR  NO SQUAMOUS EPITHELIAL CELLS SEEN ABUNDANT YEAST Performed at [facility]    Culture   Final    Culture reincubated for better growth Performed at [facility]    Report Status PENDING  Incomplete    Medical History: Past Medical History  Diagnosis Date  . Alcohol abuse 08/2015    admits to 6 pack beer daily.   . Mass of neck 05/2015    multiple soft tissue masses, palpable and seen on CT.  present since at leat 05/2015.   Marland Kitchen Anemia 08/2015    Iron deficient, microcytic  . Elevated LFTs 08/2015    acute hep ABC serologies negative.   Marland Kitchen Herpes zoster 03/2014    right C3-4, T1-2 distribution.   . Gynecomastia 08/2015    per PET scan  . Opacity of lung on imaging study 08/2015     bilateral, with effusion and mild ephysema per PET scan  . Diverticulosis of colon 08/2015    per PET scan  . Hepatomegaly 08/2015    and 1.6 cm liver cyst per PET.   Marland Kitchen Ascites 08/2015    small abd ascites and mild anasarca per PET  . CHF (congestive heart failure) 08/2015    Severe dilatation of all of the heart chambers. EF 10 to 15%.  Severe LV and moderate RV systolic dysfunction. Restrictive pattern of diastolic dysfunction. Moderate aortic, mitral, pulmonic and tricuspid regurgitation. Mild to moderate pulmonary hypertension  . AKI (acute kidney injury) 08/2015  . 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 Coffee ground emesis 09/03/2015    grade c erosive esophagitis and small HH on EGD    Medications:  Scheduled:  . antiseptic oral rinse  7 mL Mouth Rinse QID  . calcium gluconate  1 g Intravenous Once  . chlorhexidine gluconate  15 mL Mouth Rinse BID  . dexamethasone (DECADRON) IVPB [facility]  40 mg Intravenous Q24H  . feeding supplement (VITAL HIGH PROTEIN)  1,000 mL Per Tube Q24H  . fentaNYL (SUBLIMAZE) injection  50 mcg Intravenous Once  . furosemide  160 mg Intravenous 3 times per day  . ipratropium-albuterol  3 mL Nebulization Q6H  . [START ON 09/29/2015] pantoprazole (PROTONIX) IV  40 mg Intravenous Q12H  . piperacillin-tazobactam (ZOSYN)  IV  2.25 g Intravenous Q8H  . potassium chloride  10 mEq Intravenous Q1 Hr x 2   Infusions:  . sodium chloride 10 mL/hr at 09/28/15 0900  . fentaNYL infusion INTRAVENOUS 300 mcg/hr (09/28/15 0900)  . milrinone 0.25 mcg/kg/min (09/28/15 0900)  . norepinephrine (LEVOPHED) Adult infusion 8 mcg/min (09/28/15 0900)  . pantoprozole (PROTONIX) infusion 8 mg/hr (09/28/15 0900)  .  sodium bicarbonate  infusion 1000 mL 50 mL/hr at 09/28/15 0900   PRN:   Assessment: 54 yo male presented undergoing work up by Dr. Marin Olp for neck mass presents with worsening low back pain despite steroids and tramadol.  In ED, was found  to be hypothermic, tachycardic with elevated lactic acid and mottling of lower extremities noted.Currently on Day #4 of IV Zosyn for likely CAP. WBC 25.3 today, also receiving steroids. Pt remains afebrile. CrCl ~17 mL/min   9/25 >> Vanc >> 9/26 9/25 >> Zosyn >>  9/25 blood x 2: NGTD  9/25 urine: NegF  9/28 Trach asp>>  Goal of Therapy:  Zosyn dose appropriate for renal function  Plan:  -Decrease Zosyn to 2.25 gm IV Q 8 hours (EI infusion) -Monitor SCr closely. If CrCl  improves will, increase Zosyn dose -Monitor CBC, cultures and clinical progress   Darl Pikes, PharmD Clinical Pharmacist- Resident Pager: [phone]

## 2015-09-28 NOTE — Progress Notes (Signed)
Mr. Mancinas is still intubated. However, he seems to be fairly alert.  I spoke with pathology yesterday. They think that this is going to be Hodgkin's disease. This would not be a surprise. If it were Hodgkin's disease, I am not sure how to associate it with all that is going on with him with respect to the heart failure, renal failure, back pain.  If this were Hodgkin's disease, we definitely cannot use standard protocol chemotherapy with Adriamycin. I think we might be able to utilize a new agent for Hodgkin's disease which is Adcetris. This is a monoclonal antibody therapy that currently is approved for relapsed Hodgkin's disease after transplant. However, I would think that this is a very "unique" circumstance in which we might be able to get it approved. I think this would be a very useful intervention for him. It has very little toxicity. I will have to talk to our pharmacist with respect to using this given his renal insufficiency.  Overall, I don't have any other great suggestions. Hopefully, he will be extubated soon. His renal function continues to decline despite a good urine output.  There is not been any obvious bleeding. He had that GI bleeding from gastritis.  He has one more day of high-dose Decadron. If this were Hodgkin's, Decadron would help in some respect.  On his physical exam, his vital signs show temperature 98.2. Pulse 100. Blood pressure 102/62. He is still on inotropic support with Levophed and milrinone. His lungs sound pretty clear bilaterally. Cardiac exam is slightly tachycardic. It is regular. Abdomen is soft. Bowel sounds are present but slightly decreased. Extremities shows no clubbing, cyanosis or edema.  Hopefully, we will have final pathologic result today. If so, then we might be able to get treatment started in the next a or so. Again, given his circumstances, we hopefully can use single agent Adcetris to at least get some resolution and then if he improves with his  status, we might be able to intervene with chemotherapy.  I appreciate everybody's help. I know that he is getting outstanding care in the ICU. The staff is incredibly compassionate and I appreciate although they do for him.  Harriette Ohara 33:6

## 2015-09-28 NOTE — Progress Notes (Signed)
[facility] Progress Note    Assessment/ Plan:   1. AKI: Multifactorial in setting of volume depletion, NSAID use, likely rhabdomyolysis (CK 1533 on admission). Cr up slightly from 4.95 to 5.34. UOP remains excellent with 5,750 ml over last 24 hours. No emergent dialysis needs at this time. Continue IV Lasix.  2. Metabolic Acidosis/Anion Gap Metabolic Acidosis: Bicarb 31 from 28. Can consider discontinuing bicarb drip. Lactic acidosis resolved. AG 18 today.  3. Hypokalemia: Previously hyperkalemic now hypokalemic with K 3.3. Will give KCl 10 mEq x 2.  4. Hypocalcemia: Continues to be hypocalcemic. Likely multifactorial in setting of shock, malignancy (tumor lysis syndrome? Hyperphos and previously hyperK, but uric acid low), hyperphosphatemia. Will give another dose of calcium gluconate 1g.  5. Hyperphosphatemia: Ph stable at 6.2-6.3 range in setting of AKI, recent rhabdo. Continue to monitor.  6. Neck mass, likely Hodgkin's: Oncology following. Bedside FNA of left cervical lymph node 9/27.  Preliminary read with likely Hodgkin's disease.  7. 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 On milrinone and levophed drips. BPs in 32K-025K systolic. Possible CAP, on Zosyn currently. Blood and urine cultures negative so far. Respiratory culture with abundant yeast. Management per HF team and PCCM.  8. UGIB: Secondary to erosive esophagitis. EGD performed on 9/27. GI signed off today. Currently on PPI drip, but can be switched to IV PPI today. Hgb stable at 9.6.  9. Acute Bleeding in Setting of Iron Deficiency Anemia: Anemia panel on 9/13 shows low iron 12 and low saturation 5%. Ferritin elevated at 350 but also acute phase reactant in setting of likely malignancy. Baseline Hgb in 9-10 range as outpatient. Transfuse for Hgb < 7.0. Hgb stable.   Subjective:   No acute events overnight. Requiring Levophed and Milrinone for BP support. To have weaning trial today.     Objective:   BP 102/62 mmHg  Pulse 100  Temp(Src) 98.2 F (36.8 C) (Core (Comment))  Resp 26  Ht 5' 11.5" (1.816 m)  Wt 199 lb 1.2 oz (90.3 kg)  BMI 27.38 kg/m2  SpO2 100%  Intake/Output Summary (Last 24 hours) at 09/28/15 0744 Last data filed at 09/28/15 0700  Gross per 24 hour  Intake 3323.84 ml  Output   5750 ml  Net -2426.16 ml   Weight change: -3 lb 12 oz (-1.7 kg)  Physical Exam: Gen: on vent, opens eyes to voice, becomes agitated CVS: tachycardic, regular, +S3 Resp: CTA bilaterally Abd: BS+, soft, non-tender Ext: trace bilateral lower extremity edema   Imaging: US Biopsy  09/14/2015   CLINICAL DATA:  54 year old male with a history of multiple left-sided neck mass, likely lymphadenopathy and suspicious for lymphoma.  He is currently in the ICU, and has elevated INR.  EXAM: ULTRASOUND GUIDED CORE BIOPSY OF LEFT CERVICAL ADENOPATHY  MEDICATIONS: None  PROCEDURE: The procedure, risks, benefits, and alternatives were explained to the patient. Questions regarding the procedure were encouraged and answered. The patient understands and consents to the procedure.  Ultrasound survey was performed with images stored and sent to PACs.  The left neck was prepped with chlorhexidine in a sterile fashion, and a sterile drape was applied covering the operative field. A sterile gown and sterile gloves were used for the procedure. Local anesthesia was provided with 1% Lidocaine.  Ultrasound guidance was used for generous infiltration of the skin and subcutaneous tissues for local anesthesia. Ultrasound guidance was then used to day rect an 18 gauge biopsy gun into selected lymph node of  the left cervical region. Four separate 18 gauge core biopsy were retrieved.  Specimen placed in the saline.  Final image was stored.  Patient tolerated the procedure well and remained hemodynamically stable throughout.  No complications were encountered and no significant blood loss was encounter   COMPLICATIONS: None.  FINDINGS: Ultrasound survey demonstrates multiple pathologic lymph nodes of the left cervical region.  Images during the case demonstrate needle tip within the selected node on each pass.  Final image demonstrates no complicating features.  IMPRESSION: Status post ultrasound-guided core biopsy of left-sided cervical adenopathy. Tissue specimen sent to pathology for complete histopathologic analysis.  Signed,  Dulcy Fanny. Earleen Newport, DO  [facility]  [facility]   Electronically Signed   By: Corrie Mckusick D.O.   On: 09/13/2015 15:15   Dg Abd Portable 1v  09/27/2015   CLINICAL DATA:  Enteric tube placement  EXAM: PORTABLE ABDOMEN - 1 VIEW  COMPARISON:  09/15/2015 CT abdomen/pelvis .  FINDINGS: Enteric tube terminates in the body of the stomach. No dilated small bowel loops. No evidence of pneumatosis or pneumoperitoneum. Vascular calcifications are noted in the pelvis. Patchy opacity is present at the left lung base. Degenerative changes throughout the visualized thoracolumbar spine.  IMPRESSION: Enteric tube terminates in the body of the stomach. Nonobstructive bowel gas pattern.   Electronically Signed   By: Ilona Sorrel M.D.   On: 09/27/2015 13:43    Labs: BMET  Recent Labs Lab 09/17/2015 1935 09/04/2015 2237 09/25/15 0400 09/25/15 1524 09/25/2015 0540 09/27/15 0152 09/27/15 0522 09/28/15 0347  NA  --  137 139 140 139 140 139 143  K  --  5.8* 6.2* 5.5* 4.3 3.6 3.9 3.3*  CL  --  103 103 103 100* 96* 95* 94*  CO2  --  13* 14* 18* 24 27 28 31   GLUCOSE  --  114* 145* 148* 136* 174* 161* 164*  BUN  --  92* 96* 106* 113* 120* 118* 124*  CREATININE  --  3.19* 3.47* 4.23* 4.72* 4.94* 4.95* 5.34*  CALCIUM  --  7.6* 7.3* 7.0* 6.0* 5.9* 5.8* 6.2*  PHOS 11.0*  --  11.1*  --  7.1* 6.2* 6.3* 6.2*   CBC  Recent Labs Lab 09/25/15 1524 09/23/2015 0539 09/27/15 0522 09/28/15 0319  WBC 15.0* 14.2* 23.0* 25.3*  NEUTROABS 13.7* 13.3* 21.9* 24.0*   HGB 11.0* 9.9* 9.7* 9.6*  HCT 34.9* 31.0* 30.3* 30.0*  MCV 78.3 75.4* 74.6* 73.9*  PLT 327 258 192 125*    Medications:    . antiseptic oral rinse  7 mL Mouth Rinse QID  . chlorhexidine gluconate  15 mL Mouth Rinse BID  . dexamethasone (DECADRON) IVPB [facility]  40 mg Intravenous Q24H  . feeding supplement (VITAL HIGH PROTEIN)  1,000 mL Per Tube Q24H  . fentaNYL (SUBLIMAZE) injection  50 mcg Intravenous Once  . furosemide  160 mg Intravenous 3 times per day  . ipratropium-albuterol  3 mL Nebulization Q6H  . [START ON 09/29/2015] pantoprazole (PROTONIX) IV  40 mg Intravenous Q12H  . piperacillin-tazobactam (ZOSYN)  IV  3.375 g Intravenous Q8H      Albin Felling, MD, MPH Internal Medicine Resident, PGY-II Pager: [phone]   09/28/2015, 7:44 AM

## 2015-09-29 DIAGNOSIS — C8198 Hodgkin lymphoma, unspecified, lymph nodes of multiple sites: Secondary | ICD-10-CM

## 2015-09-29 DIAGNOSIS — J9690 Respiratory failure, unspecified, unspecified whether with hypoxia or hypercapnia: Secondary | ICD-10-CM | POA: Insufficient documentation

## 2015-09-29 LAB — HEPATIC FUNCTION PANEL
ALBUMIN: 2 g/dL — AB (ref 3.5–5.0)
ALT: 160 U/L — ABNORMAL HIGH (ref 17–63)
AST: 104 U/L — AB (ref 15–41)
Alkaline Phosphatase: 168 U/L — ABNORMAL HIGH (ref 38–126)
BILIRUBIN TOTAL: 5.9 mg/dL — AB (ref 0.3–1.2)
Bilirubin, Direct: 3.7 mg/dL — ABNORMAL HIGH (ref 0.1–0.5)
Indirect Bilirubin: 2.2 mg/dL — ABNORMAL HIGH (ref 0.3–0.9)
Total Protein: 5.9 g/dL — ABNORMAL LOW (ref 6.5–8.1)

## 2015-09-29 LAB — RENAL FUNCTION PANEL
ANION GAP: 18 — AB (ref 5–15)
Albumin: 2 g/dL — ABNORMAL LOW (ref 3.5–5.0)
BUN: 128 mg/dL — ABNORMAL HIGH (ref 6–20)
CALCIUM: 6.6 mg/dL — AB (ref 8.9–10.3)
CO2: 33 mmol/L — AB (ref 22–32)
Chloride: 92 mmol/L — ABNORMAL LOW (ref 101–111)
Creatinine, Ser: 5.87 mg/dL — ABNORMAL HIGH (ref 0.61–1.24)
GFR calc non Af Amer: 10 mL/min — ABNORMAL LOW (ref >60)
GFR, EST AFRICAN AMERICAN: 11 mL/min — AB (ref >60)
Glucose, Bld: 160 mg/dL — ABNORMAL HIGH (ref 65–99)
POTASSIUM: 3.7 mmol/L (ref 3.5–5.1)
Phosphorus: 6.4 mg/dL — ABNORMAL HIGH (ref 2.5–4.6)
SODIUM: 143 mmol/L (ref 135–145)

## 2015-09-29 LAB — CBC WITH DIFFERENTIAL/PLATELET
BASOS PCT: 0 %
Basophils Absolute: 0 10*3/uL (ref 0.0–0.1)
EOS PCT: 0 %
Eosinophils Absolute: 0 10*3/uL (ref 0.0–0.7)
HEMATOCRIT: 31 % — AB (ref 39.0–52.0)
HEMOGLOBIN: 10 g/dL — AB (ref 13.0–17.0)
LYMPHS PCT: 1 %
Lymphs Abs: 0.3 10*3/uL — ABNORMAL LOW (ref 0.7–4.0)
MCH: 24 pg — AB (ref 26.0–34.0)
MCHC: 32.3 g/dL (ref 30.0–36.0)
MCV: 74.5 fL — AB (ref 78.0–100.0)
MONOS PCT: 1 %
Monocytes Absolute: 0.3 10*3/uL (ref 0.1–1.0)
NEUTROS ABS: 27.1 10*3/uL — AB (ref 1.7–7.7)
Neutrophils Relative %: 98 %
Platelets: 74 10*3/uL — ABNORMAL LOW (ref 150–400)
RBC: 4.16 MIL/uL — ABNORMAL LOW (ref 4.22–5.81)
RDW: 18.2 % — ABNORMAL HIGH (ref 11.5–15.5)
WBC: 27.7 10*3/uL — ABNORMAL HIGH (ref 4.0–10.5)

## 2015-09-29 LAB — CARBOXYHEMOGLOBIN
CARBOXYHEMOGLOBIN: 1.9 % — AB (ref 0.5–1.5)
CARBOXYHEMOGLOBIN: 1.1 % (ref 0.5–1.5)
CARBOXYHEMOGLOBIN: 1.5 % (ref 0.5–1.5)
CARBOXYHEMOGLOBIN: 1.4 % (ref 0.5–1.5)
METHEMOGLOBIN: 1 % (ref 0.0–1.5)
METHEMOGLOBIN: 1 % (ref 0.0–1.5)
Methemoglobin: 1 % (ref 0.0–1.5)
Methemoglobin: 0.8 % (ref 0.0–1.5)
O2 SAT: 66.4 %
O2 SAT: 42.2 %
O2 Saturation: 50.2 %
O2 Saturation: 48.1 %
TOTAL HEMOGLOBIN: 10 g/dL — AB (ref 13.5–18.0)
TOTAL HEMOGLOBIN: 9.9 g/dL — AB (ref 13.5–18.0)
Total hemoglobin: 9.3 g/dL — ABNORMAL LOW (ref 13.5–18.0)
Total hemoglobin: 10.3 g/dL — ABNORMAL LOW (ref 13.5–18.0)

## 2015-09-29 LAB — CULTURE, RESPIRATORY W GRAM STAIN

## 2015-09-29 LAB — APTT: aPTT: 31 seconds (ref 24–37)

## 2015-09-29 LAB — GLUCOSE, CAPILLARY
GLUCOSE-CAPILLARY: 162 mg/dL — AB (ref 65–99)
GLUCOSE-CAPILLARY: 164 mg/dL — AB (ref 65–99)
Glucose-Capillary: 146 mg/dL — ABNORMAL HIGH (ref 65–99)
Glucose-Capillary: 164 mg/dL — ABNORMAL HIGH (ref 65–99)
Glucose-Capillary: 167 mg/dL — ABNORMAL HIGH (ref 65–99)

## 2015-09-29 LAB — PROTIME-INR
INR: 1.65 — AB (ref 0.00–1.49)
PROTHROMBIN TIME: 19.5 s — AB (ref 11.6–15.2)

## 2015-09-29 LAB — MAGNESIUM: Magnesium: 2.4 mg/dL (ref 1.7–2.4)

## 2015-09-29 LAB — FIBRINOGEN: FIBRINOGEN: 324 mg/dL (ref 204–475)

## 2015-09-29 MED ORDER — DIPHENHYDRAMINE HCL 50 MG/ML IJ SOLN: 25.0000 mg | Freq: Once | INTRAMUSCULAR | Status: DC | PRN

## 2015-09-29 MED ORDER — EPINEPHRINE HCL 1 MG/ML IJ SOLN: 0.5000 mg | Freq: Once | INTRAMUSCULAR | Status: DC | PRN

## 2015-09-29 MED ORDER — FAMOTIDINE IN NACL 20-0.9 MG/50ML-% IV SOLN
20.0000 mg | Freq: Once | INTRAVENOUS | Status: DC | PRN
  Filled 2015-09-29: qty 50

## 2015-09-29 MED ORDER — DIPHENHYDRAMINE HCL 50 MG/ML IJ SOLN
50.0000 mg | Freq: Once | INTRAMUSCULAR | Status: AC
  Administered 2015-09-29: 50 mg via INTRAVENOUS
  Filled 2015-09-29: qty 1

## 2015-09-29 MED ORDER — SODIUM CHLORIDE 0.9 % IV SOLN: Freq: Once | INTRAVENOUS | Status: AC

## 2015-09-29 MED ORDER — ALTEPLASE 2 MG IJ SOLR: 2.0000 mg | Freq: Once | INTRAMUSCULAR | Status: DC | PRN

## 2015-09-29 MED ORDER — HEPARIN SOD (PORK) LOCK FLUSH 100 UNIT/ML IV SOLN
250.0000 [IU] | Freq: Once | INTRAVENOUS | Status: DC | PRN
  Filled 2015-09-29: qty 3

## 2015-09-29 MED ORDER — METHYLPREDNISOLONE SODIUM SUCC 125 MG IJ SOLR: 125.0000 mg | Freq: Once | INTRAMUSCULAR | Status: DC | PRN

## 2015-09-29 MED ORDER — SODIUM CHLORIDE 0.9 % IV SOLN
Freq: Once | INTRAVENOUS | Status: AC
  Administered 2015-09-29: 13:00:00 via INTRAVENOUS
  Filled 2015-09-29: qty 4

## 2015-09-29 MED ORDER — ALLOPURINOL 100 MG PO TABS
100.0000 mg | ORAL_TABLET | Freq: Every day | ORAL | Status: DC
  Administered 2015-09-29: 100 mg via ORAL
  Filled 2015-09-29 (×2): qty 1

## 2015-09-29 MED ORDER — DEXAMETHASONE 4 MG PO TABS: 8.0000 mg | ORAL_TABLET | Freq: Two times a day (BID) | ORAL | Status: DC

## 2015-09-29 MED ORDER — EPINEPHRINE HCL 0.1 MG/ML IJ SOSY: 0.2500 mg | PREFILLED_SYRINGE | Freq: Once | INTRAMUSCULAR | Status: DC | PRN

## 2015-09-29 MED ORDER — ONDANSETRON HCL 8 MG PO TABS: 8.0000 mg | ORAL_TABLET | Freq: Two times a day (BID) | ORAL | Status: DC

## 2015-09-29 MED ORDER — SODIUM CHLORIDE 0.9 % IJ SOLN: 10.0000 mL | INTRAMUSCULAR | Status: DC | PRN

## 2015-09-29 MED ORDER — ACYCLOVIR 200 MG/5ML PO SUSP
400.0000 mg | Freq: Every day | ORAL | Status: DC
  Administered 2015-09-29: 400 mg
  Filled 2015-09-29: qty 10

## 2015-09-29 MED ORDER — DIPHENHYDRAMINE HCL 50 MG/ML IJ SOLN: 50.0000 mg | Freq: Once | INTRAMUSCULAR | Status: DC | PRN

## 2015-09-29 MED ORDER — ALBUTEROL SULFATE (2.5 MG/3ML) 0.083% IN NEBU: 2.5000 mg | INHALATION_SOLUTION | Freq: Once | RESPIRATORY_TRACT | Status: DC | PRN

## 2015-09-29 MED ORDER — SODIUM CHLORIDE 0.9 % IV SOLN
100.0000 mg | Freq: Once | INTRAVENOUS | Status: AC
  Administered 2015-09-29: 100 mg via INTRAVENOUS
  Filled 2015-09-29: qty 20

## 2015-09-29 MED ORDER — SODIUM CHLORIDE 0.9 % IJ SOLN: 3.0000 mL | INTRAMUSCULAR | Status: DC | PRN

## 2015-09-29 MED ORDER — SODIUM CHLORIDE 0.9 % IV SOLN: Freq: Once | INTRAVENOUS | Status: DC | PRN

## 2015-09-29 MED ORDER — ACYCLOVIR 200 MG PO CAPS
400.0000 mg | ORAL_CAPSULE | Freq: Every day | ORAL | Status: DC
  Filled 2015-09-29: qty 2

## 2015-09-29 MED ORDER — LIDOCAINE-PRILOCAINE 2.5-2.5 % EX CREA: TOPICAL_CREAM | CUTANEOUS | Status: DC

## 2015-09-29 MED ORDER — HEPARIN SOD (PORK) LOCK FLUSH 100 UNIT/ML IV SOLN
500.0000 [IU] | Freq: Once | INTRAVENOUS | Status: DC | PRN
  Filled 2015-09-29 (×2): qty 5

## 2015-09-29 MED ORDER — ACETAMINOPHEN 325 MG PO TABS
650.0000 mg | ORAL_TABLET | Freq: Once | ORAL | Status: AC
  Administered 2015-09-29: 650 mg via ORAL
  Filled 2015-09-29: qty 2

## 2015-09-29 MED ORDER — LORAZEPAM 0.5 MG PO TABS: 0.5000 mg | ORAL_TABLET | Freq: Four times a day (QID) | ORAL | Status: DC | PRN

## 2015-09-29 MED ORDER — PANTOPRAZOLE SODIUM 40 MG PO TBEC
40.0000 mg | DELAYED_RELEASE_TABLET | Freq: Two times a day (BID) | ORAL | Status: DC
  Administered 2015-09-29: 40 mg via ORAL
  Filled 2015-09-29: qty 1

## 2015-09-29 MED ORDER — PROCHLORPERAZINE MALEATE 10 MG PO TABS: 10.0000 mg | ORAL_TABLET | Freq: Four times a day (QID) | ORAL | Status: DC | PRN

## 2015-09-29 NOTE — Progress Notes (Addendum)
Advanced Heart Failure Team Rounding Note  Referring Physician: Dr Vaughan Browner Primary Physician: Primary Cardiologist:  None Oncologist: Dr Marin Olp  Reason for Consultation: Acute Systolic Heart Failure   HPI:    Paul Morton is a 54 year old male with h/o tobacco and ETOH abuse who recently developed large cervical adenopathy and intractable back pain. Underwent PET scan earlier this month which showed hypermetabolic left cervical lymphadenopathy involving levels II-IV and presence of hepatomegaly with diffuse liver hypermetabolism suggesting probably lymphoma.   Admitted 9/26 with multisystem organ failure. Echo performed and EF 10-15% with moderate RV dysfunction. Initial co-ox drawn was 55%. Started on milrinone. Intubated and transferred to [facility]. Developed large GI bleed. EGDwith esophagitis.   Underwent core biopsy of cervical node 9/27.   Yesterday switched from milrinone to dobutamine 5 mcg with reduced CO-OX and worsening renal function. Self extubated this morning. Tachycardic so dobutmaine was cut back to 2. 5 mcg. Norepi weaned to 1 mcg. WBC trending up. Completed steroid course. Afebrile   Denies SOB   Creatinine 5.3> 5.87    Objective:    Vital Signs:   Temp:  [97.3 F (36.3 C)-98 F (36.7 C)] 97.9 F (36.6 C) (09/30 0700) Pulse Rate:  [47-115] 99 (09/30 0600) Resp:  [19-26] 26 (09/30 0700) BP: (90-113)/(52-72) 99/65 mmHg (09/30 0700) SpO2:  [95 %-100 %] 98 % (09/30 0600) FiO2 (%):  [30 %] 30 % (09/30 0326) Weight:  [187 lb 9.8 oz (85.1 kg)] 187 lb 9.8 oz (85.1 kg) (09/30 0500)    Weight change: Filed Weights   09/27/15 0500 09/28/15 0500 09/29/15 0500  Weight: 202 lb 13.2 oz (92 kg) 199 lb 1.2 oz (90.3 kg) 187 lb 9.8 oz (85.1 kg)    Intake/Output:   Intake/Output Summary (Last 24 hours) at 09/29/15 0854 Last data filed at 09/29/15 0700  Gross per 24 hour  Intake 2000.36 ml  Output   5100 ml  Net -3099.64 ml     Physical Exam: CVP 7  General:  NAD    HEENT: normal  Neck: supple. RIJ TLC . Prominent left neck lymphadenopathy Cor: PMI laterally displaced. Tachy regular + s3 Lungs: clear Abdomen: soft, nontender, nondistended. No bruits or masses. Good bowel sounds. Extremities: no cyanosis, clubbing, rash, no edema Neuro: Alert and oriented.  Telemetry: sinus tach 120-130s  Labs: Basic Metabolic Panel:  Recent Labs Lab 09/17/2015 0539 09/25/2015 0540 09/27/15 0152 09/27/15 0522 09/28/15 0319 09/28/15 0347 09/29/15 0511  NA  --  139 140 139  --  143 143  K  --  4.3 3.6 3.9  --  3.3* 3.7  CL  --  100* 96* 95*  --  94* 92*  CO2  --  24 27 28   --  31 33*  GLUCOSE  --  136* 174* 161*  --  164* 160*  BUN  --  113* 120* 118*  --  124* 128*  CREATININE  --  4.72* 4.94* 4.95*  --  5.34* 5.87*  CALCIUM  --  6.0* 5.9* 5.8*  --  6.2* 6.6*  MG 2.5*  --  2.5* 2.5* 2.4  --  2.4  PHOS  --  7.1* 6.2* 6.3*  --  6.2* 6.4*    Liver Function Tests:  Recent Labs Lab 09/25/15 1524 09/24/2015 0539 09/10/2015 0540 09/27/15 0522 09/28/15 0319 09/28/15 0347 09/29/15 0511  AST 649* 516*  --  251* 151*  --  104*  ALT 347* 290*  --  226* 190*  --  160*  ALKPHOS 215* 173*  --  169* 160*  --  168*  BILITOT 5.1* 5.8*  --  7.1* 6.4*  --  5.9*  PROT 5.2* 5.2*  --  5.4* 5.8*  --  5.9*  ALBUMIN 2.0* 1.9* 1.8* 2.0*  2.0* 2.0* 1.9* 2.0*  2.0*   No results for input(s): LIPASE, AMYLASE in the last 168 hours.  Recent Labs Lab 09/25/15 1523  AMMONIA 37*    CBC:  Recent Labs Lab 09/25/15 1524 09/25/2015 0539 09/27/15 0522 09/28/15 0319 09/29/15 0511  WBC 15.0* 14.2* 23.0* 25.3* 27.7*  NEUTROABS 13.7* 13.3* 21.9* 24.0* 27.1*  HGB 11.0* 9.9* 9.7* 9.6* 10.0*  HCT 34.9* 31.0* 30.3* 30.0* 31.0*  MCV 78.3 75.4* 74.6* 73.9* 74.5*  PLT 327 258 192 125* 74*    Cardiac Enzymes:  Recent Labs Lab 09/11/2015 1630 09/25/15 0400 09/25/15 0800 09/25/15 1524 09/11/2015 0539 09/27/15 0152  CKTOTAL 1533* 1111* 1052* 938* 674*  --   CKMB  --   --   73.5*  --   --   --   TROPONINI  --   --   --   --   --  0.09*    BNP: BNP (last 3 results) No results for input(s): BNP in the last 8760 hours.  ProBNP (last 3 results) No results for input(s): PROBNP in the last 8760 hours.   CBG:  Recent Labs Lab 09/28/15 0330 09/28/15 0813 09/28/15 1152 09/28/15 1543 09/29/15 0351  GLUCAP 149* 160* 153* 146* 162*    Coagulation Studies:  Recent Labs  09/26/15 1215 09/27/15 0522 09/28/15 0319 09/29/15 0511  LABPROT 22.3* 20.2* 20.1* 19.5*  INR 1.97* 1.73* 1.72* 1.65*    Other results: EKG: ST 107 RBBB/LAFB  Imaging: Dg Abd Portable 1v  09/27/2015   CLINICAL DATA:  Enteric tube placement  EXAM: PORTABLE ABDOMEN - 1 VIEW  COMPARISON:  09/22/2015 CT abdomen/pelvis .  FINDINGS: Enteric tube terminates in the body of the stomach. No dilated small bowel loops. No evidence of pneumatosis or pneumoperitoneum. Vascular calcifications are noted in the pelvis. Patchy opacity is present at the left lung base. Degenerative changes throughout the visualized thoracolumbar spine.  IMPRESSION: Enteric tube terminates in the body of the stomach. Nonobstructive bowel gas pattern.   Electronically Signed   By: Ilona Sorrel M.D.   On: 09/27/2015 13:43     Medications:     Current Medications: . acyclovir  400 mg Per Tube Daily  . allopurinol  100 mg Per Tube Daily  . antiseptic oral rinse  7 mL Mouth Rinse QID  . chlorhexidine gluconate  15 mL Mouth Rinse BID  . fentaNYL (SUBLIMAZE) injection  50 mcg Intravenous Once  . ipratropium-albuterol  3 mL Nebulization Q6H  . nicotine  7 mg Transdermal Daily  . pantoprazole (PROTONIX) IV  40 mg Intravenous Q12H  . piperacillin-tazobactam (ZOSYN)  IV  2.25 g Intravenous Q8H    Infusions: . sodium chloride 10 mL/hr at 09/29/15 0700  . DOBUTamine 5 mcg/kg/min (09/29/15 0700)  . feeding supplement (VITAL AF 1.2 CAL) 1,000 mL (09/29/15 0700)  . fentaNYL infusion INTRAVENOUS 400 mcg/hr (09/29/15 0700)    . norepinephrine (LEVOPHED) Adult infusion 8 mcg/min (09/29/15 0700)     Assessment:   1. Cardiogenic shock 2. Acute systolic HF EF 96-04%, etiology uncertain.  Brother has dilated cardiomyopathy so possibly familial.  Reports drinking 6 beers/day long-term.  3. Acute respiratory failure 4. Multisystem organ failure 5. Acute kidney injury 6. Shock liver  7. Hyperkalemia 8. Cervical lymphadenopathy - path appears to be Hodgkins lymphoma.     --s/p core biopsy 9/27 9. Tobacco use 10. ETOH use - 4+ beers per day 11. Family h/o NICM in brother 54. Massive upper GI bleed     --severe esophagitis by EGD on 9/27 13. Coagulopathy - due t shock liver with ? DIC 14. ?CAP  Plan/Discussion:   Self extubated this morning.   Switched from milrinone to dobutamine. Norepi weaned off. Tachycardic this morning so dobutamine cut back to 2.5 mcg. CO-OX today pending. Off diuretics with good urine output noted.   Path results appear to be Hodgkin's. Dr Marin Olp managing.     CLEGG,AMY, NP-C   Advanced Heart Failure Team Pager [phone] (M-F; 7a - 4p)  Please contact [facility] for night-coverage after hours (4p -7a ) and weekends on [email or web address]  Patient seen with NP, agree with the above note.  1. AKI: Creatinine continues to rise.  Very good UOP, however, with CVP 7.  No diuretics.  Follow for recovery, avoid hypotension.  2. Cardiogenic shock: EF 38-18%, uncertain etiology for CMP though brother has dilated cardiomyopathy (may be familial) and he admits to 6 beers/day.  He was transitioned to dobutamine and norepinephrine was weaned off this morning.  Now with co-ox 50% and tachy to about 130 (sinus tachy).   - Would continue dobutamine at 2.5 but restart low dose norepinephrine at 2-3 with soft BP and low co-ox.  Resend co-ox later today.  3. Hodgkins lymphoma: New diagnosis.  Suspect not related to the cardiomyopathy.  Plan treatment with Adcetris (no Adriamycin).  4. Possible CAP: Broad  spectrum abx.   35 minutes critical care time.   Loralie Champagne 09/29/2015 10:33 AM  Co-ox down to 43% after decreasing dobutamine.  Decreasing dobutamine seems to have made no change in HR.  Would increase back to 5 mcg/kg/min and repeat co-ox.  Continue norepinephrine at 4. If HR rises further, will reassess.  Loralie Champagne 09/29/2015 5:52 PM

## 2015-09-29 NOTE — Progress Notes (Signed)
Chemotherapy education including printed materials provided and reviewed with spouse about 1000 this morning.  Spouse signed chemotherapy consent.

## 2015-09-29 NOTE — Progress Notes (Signed)
RN called RT and notified patient self extubated. Patient in no distress at this time. Patient placed on oxygen. Patient able to speak. Vital signs stable at this time. RT will continue to monitor.

## 2015-09-29 NOTE — Progress Notes (Signed)
PULMONARY / CRITICAL CARE MEDICINE   Name: Paul Morton MRN: [id number] DOB: [birth year]    ADMISSION DATE:  09/23/2015  REFERRING MD :  EDP  CHIEF COMPLAINT:  Sepsis   INITIAL PRESENTATION: 54yo male smoker with recent diagnosis lymphoma (still in early stages of workup) who presented 9/25 with progressive back pain.  In ER found to be tachycardic, hypotensive with lactate 14, AKI and coagulopathy.  PCCM called to admit.    STUDIES:  PET 9/22 - Bulky confluent hypermetabolic left cervical lymphadenopathy involving levels II-V. Given the presence of hepatomegaly with diffuse liver hypermetabolism, a diagnosis of lymphoma is favored, however metastatic carcinoma remains on the differential.  Hypermetabolic patchy ground-glass opacity and consolidation in both upper lobes and right lower lobe, favor an infectious or inflammatory etiology.  TTE 9/26 - EF 10-15% w/o regional wall motion abnormality. Grade 3 diastolic dysfunction. Moderate AR. Moderate MR. RV severely dilated w/ moderate reduction in systolic function. PASP 43 mmHg. No pericardial effusion.  RLE Venous Duplex 9/26 - no DVT or SVT  MRI spine 9/25 - incomplete exam, No evidence of significant spinal stenosis or cord compression in the cervical or visualized thoracic spine.  MRI L-spine 9/27 - suboptimal exam. No malignant or suspicious signal.  SIGNIFICANT EVENTS: 9/25 - Admitted to hospital 9/25 - Right IJ CVC by ED 9/26 - Intubated for pending respiratory failure & transferred to [facility] 9/27 - EGD w/ erosive esophagitis 9/27 - FNA neck mass 9/30 - Self extubation during SBT  SUBJECTIVE: No acute events overnight. Patient spontaneously extubated this morning during lightening of sedation and spontaneous breathing trial. He denies any difficulty breathing at this time. He denies any chest pain or pressure.  REVIEW OF SYSTEMS: Denies any nausea. Patient still somewhat confused and accurate review of systems is  questionable.  VITAL SIGNS: Temp:  [97.3 F (36.3 C)-98 F (36.7 C)] 97.9 F (36.6 C) (09/30 0700) Pulse Rate:  [47-115] 99 (09/30 0600) Resp:  [19-26] 26 (09/30 0700) BP: (90-113)/(52-72) 99/65 mmHg (09/30 0700) SpO2:  [95 %-100 %] 98 % (09/30 0600) FiO2 (%):  [30 %] 30 % (09/30 0326) Weight:  [187 lb 9.8 oz (85.1 kg)] 187 lb 9.8 oz (85.1 kg) (09/30 0500) HEMODYNAMICS: CVP:  [6 mmHg-8 mmHg] 7 mmHg VENTILATOR SETTINGS: Vent Mode:  [-] PRVC FiO2 (%):  [30 %] 30 % Set Rate:  [26 bmp] 26 bmp Vt Set:  [600 mL] 600 mL PEEP:  [5 cmH20] 5 cmH20 Plateau Pressure:  [18 cmH20-21 cmH20] 21 cmH20 INTAKE / OUTPUT:  Intake/Output Summary (Last 24 hours) at 09/29/15 0902 Last data filed at 09/29/15 0700  Gross per 24 hour  Intake 1844.26 ml  Output   4600 ml  Net -2755.74 ml    PHYSICAL EXAMINATION: General:  Awake. Post extubation. No acute distress. Appears comfortable.  Integument:  Warm & dry. No rash on exposed skin.  HEENT:  Dressing over neck on left postbiopsy is clean and dry. Endotracheal tube removed. Patient currently on nasal cannula oxygen. Cardiovascular:  Regular rhythm. Tachycardic. No edema.  Pulmonary: Coarse breath sounds bilaterally off ventilator. Normal work of breathing on nasal cannula oxygen. Speaking in complete sentences with good voice clarity and quality. Abdomen: Soft. Normal bowel sounds. Nondistended. Nontender.  Neurological: Following commands. Patient oriented to year, season, & place. He was not aware of the president. Grossly nonfocal.  LABS:  CBC  Recent Labs Lab 09/27/15 0522 09/28/15 0319 09/29/15 0511  WBC 23.0* 25.3* 27.7*  HGB 9.7* 9.6*  10.0*  HCT 30.3* 30.0* 31.0*  PLT 192 125* 74*   Coag's  Recent Labs Lab 09/27/15 0522 09/28/15 0319 09/29/15 0511  APTT 30 29 31   INR 1.73* 1.72* 1.65*   BMET  Recent Labs Lab 09/27/15 0522 09/28/15 0347 09/29/15 0511  NA 139 143 143  K 3.9 3.3* 3.7  CL 95* 94* 92*  CO2 28 31 33*   BUN 118* 124* 128*  CREATININE 4.95* 5.34* 5.87*  GLUCOSE 161* 164* 160*   Electrolytes  Recent Labs Lab 09/27/15 0522 09/28/15 0319 09/28/15 0347 09/29/15 0511  CALCIUM 5.8*  --  6.2* 6.6*  MG 2.5* 2.4  --  2.4  PHOS 6.3*  --  6.2* 6.4*   Sepsis Markers  Recent Labs Lab 09/05/2015 2237  09/25/15 0800 09/09/2015 0541 09/27/15 0524  LATICACIDVEN 8.6*  < > 8.4* 2.2* 1.4  PROCALCITON 5.88  --   --   --   --   < > = values in this interval not displayed. ABG  Recent Labs Lab 09/25/15 0734 09/25/15 1310 09/25/15 1712  PHART 7.237* 7.162* 7.311*  PCO2ART 25.4* 41.4 34.8*  PO2ART 83.6 352* 78.1*   Liver Enzymes  Recent Labs Lab 09/27/15 0522 09/28/15 0319 09/28/15 0347 09/29/15 0511  AST 251* 151*  --  104*  ALT 226* 190*  --  160*  ALKPHOS 169* 160*  --  168*  BILITOT 7.1* 6.4*  --  5.9*  ALBUMIN 2.0*  2.0* 2.0* 1.9* 2.0*  2.0*   Cardiac Enzymes  Recent Labs Lab 09/27/15 0152  TROPONINI 0.09*   Glucose  Recent Labs Lab 09/27/15 2347 09/28/15 0330 09/28/15 0813 09/28/15 1152 09/28/15 1543 09/29/15 0351  GLUCAP 164* 149* 160* 153* 146* 162*    Imaging No results found.  ASSESSMENT / PLAN:  PULMONARY OEET 9/26>>>9/30 (self extubated) A: Acute hypoxic respiratory failure - improving Possible CAP H/O Tobacco Use  P:   Nasal cannula oxygen Duonebs q6hr Incentive Spirometry Nicotine patch See ID section  CARDIOVASCULAR CVL R IJ CVL (EDP) 9/25>>> A: Nonischemic Cardiomyopathy Shock - Sepsis versus Cardiac in etiology  P:  Cardiology/Heart Failure Team consulted Dobutamine gtt Levophed gtt to maintain MAP >65  RENAL A: Acute Renal Failure - Worsening function but continued good UOP. Mild Hypokalemia - resolved Hyperkalemia - resolved. Hyponatremia - resolved  Metabolic Acidosis - resolved Lactic acidosis - resolved  P:   Nephrology following Monitoring UOP with Foley catheter Trending daily  electrolytes  GASTROINTESTINAL A: Shock liver - improving Upper GIB - secondary to erosive esophagitis on EGD  P:   NPO post extubation Trending LFTs daily Protonix IV bid GI consulted - s/p EGD Hepatitis A/B/C negative on serum  HEMATOLOGIC Coagulopathy - Unclear etiology but likely acute liver injury. Improving. S/P Vitamin K & 4u FFP. Leukocytosis - worsening Anemia - Secondary to upper GIB Classic Hodgkin lymphoma - FNA/core biopsy 9/27.  P:  Dr. Marin Olp following Trending Hgb w/ CBC daily Trending Coagulopathy SCDs Decadron 40mg  IV q24hr  INFECTIOUS A: Sepsis - Possible CAP  P:   Resp ctx 9/27>>> BCx2 9/25>>> UC 9/25>>>negative  Vancomycin 9/25>>> Zosyn 9/25>>>  ENDOCRINE A: Hypoglycemia   P:   CBG q4 hr w/ parameters to notify MD  NEUROLOGIC A: Altered Mental Status - Improving. Toxic Metabolic Encephalopathy versus Dilaudid induced  Back pain - unclear etiology. MRI spine negative.  P:   MRI spine negative DC fentanyl DC Versed  FAMILY  - Updates:  No family at bedside. Spoke with the  patient's wife yesterday to update her via phone.  TODAY'S SUMMARY: 55 year old male with left neck mass and concern for lymphoma. Intubated on 9/26 for worsening hypoxic respiratory failure. Continuing on vasopressor & inotropic support. Patient self extubated this morning during spontaneous breathing trial. Continues to have good urine output with inotrope support despite absence of Lasix. Biopsy finalized as classical Hodgkin's lymphoma.  I have spent a total of 32 minutes of critical care time today caring for the patient & reviewing the patient's electronic medical record.  Sonia Baller Ashok Cordia, M.D. [facility] Pulmonary & Critical Care Pager:  [phone] After 3pm or if no response, call [phone]  09/29/2015  9:02 AM

## 2015-09-29 NOTE — Progress Notes (Signed)
[name] certainly is much more alert today. Hopefully, he will be oh to be extubated.  I talked to him this morning about the fact that we are going to start treatment. I told him that the treatment was not chemotherapy. I told him that we cannot use chemotherapy because of his cardiac function and renal function.  I told him about Adcetris. I did not go into a lot of detail. However, I think he did understand what was going on and that we are going to start treatment.  His labs are really about the same. His creatinine is now 5.87. BUN is 128. His bilirubin is 5.9. His blood count was 74,000. White cell count 27,000. Hemoglobin 10. He's only has a very good urine output. He put out 5100 mL yesterday.  He is into feeds.  Again he did seem to be much more alert.  On his physical exam, his blood pressure still low at 91/70. He is still on pressor support. I'm sure this probably is contributing to his renal insufficiency. He is afebrile.  Where he had the biopsy of the left cervical lymph node, this is healing. I cannot palpate any other adenopathy.  Again, it is hard to know how much of the overall clinical picture is being caused by the Hodgkin's disease. I must say that in 20 years, I have never seen Hodgkin's disease do what is being done. The only way that we will know is to treat the Hodgkin's disease.  Adcetris is incredibly appropriate for him. Despite his blood counts, renal function, liver function, I still feel that Adcetris could be safely administered.  I do appreciate everybody's help. I know that the ICU staff is doing a fantastic job.  Pete E.  1 Timothy 2:5

## 2015-09-29 NOTE — Progress Notes (Signed)
Into patient room for wakeup assesment. Sedation decreased by 50% at this time, patient agitated, despite safety mitts patient removed his ETT.. Patient alert and oriented following commands, Dr. Ashok Cordia at bedside, tube feed stopped as well as sedation.  Will continue to monitor closely.

## 2015-09-29 NOTE — Progress Notes (Signed)
[facility] Progress Note    Assessment/ Plan:   1. AKI: Multifactorial in setting of volume depletion, NSAID use, likely rhabdomyolysis (CK 1533 on admission). Cr continues to rise from 4.95>5.34>5.87. However, UOP remains excellent with 5,600 ml over last 24 hours despite Lasix being discontinued yesterday. No emergent dialysis needs at this time. Expect to see kidney function improve as he recovers from his acute illness.  2. Metabolic Acidosis/Anion Gap Metabolic Acidosis: Bicarb 33 from 31. Sodium bicarb drip discontinued 9/29. AG still 18.  3. Hypokalemia: K 3.7 this AM. Continue to monitor.  4. Hypocalcemia: Continues to be hypocalcemic. Likely multifactorial in setting of shock, malignancy (tumor lysis syndrome? Hyperphos and previously hyperK, but uric acid low), hyperphosphatemia. Recommend to give another dose of calcium gluconate 1 g.  5. Hyperphosphatemia: Ph stable at 6.2-6.4 range in setting of AKI, recent rhabdo. Continue to monitor.  6. Hodgkin's Lymphoma: Oncology following. Bedside FNA of left cervical lymph node 9/27. Biopsy consistent with Hodgkin's. Cannot start adriamycin given CM. Oncology starting Adcentris.  7. 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 Switched from milrinone to dopamine. Remains on levophed drip. BPs in 81W-299B systolic. Possible CAP, on Zosyn currently. Blood and urine cultures negative so far. Respiratory culture with abundant yeast. Management per HF team and PCCM.  8. UGIB: Secondary to erosive esophagitis. EGD performed on 9/27. Currently on IV Protonix. Hgb stable at 10.0. 9. Acute Bleeding in Setting of Iron Deficiency Anemia: Anemia panel on 9/13 shows low iron 12 and low saturation 5%. Ferritin elevated at 350 but also acute phase reactant in setting of likely malignancy. Baseline Hgb in 9-10 range as outpatient. Transfuse for Hgb < 7.0. Hgb stable.   Subjective:   No acute events overnight. Patient  self-extubated this morning. No complaints at this time. UOP remains excellent.    Objective:   BP 94/78 mmHg  Pulse 131  Temp(Src) 99.1 F (37.3 C) (Core (Comment))  Resp 18  Ht 5' 11.5" (1.816 m)  Wt 187 lb 9.8 oz (85.1 kg)  BMI 25.80 kg/m2  SpO2 97%  Intake/Output Summary (Last 24 hours) at 09/29/15 1157 Last data filed at 09/29/15 1000  Gross per 24 hour  Intake 1753.87 ml  Output   4800 ml  Net -3046.13 ml   Weight change: -11 lb 7.4 oz (-5.2 kg)  Physical Exam: Gen: alert, sitting up in bed, extubated  CVS: tachycardic, regular, +S3 Resp: CTA bilaterally Abd: BS+, soft, non-tender Ext: no peripheral edema   Imaging: Dg Abd Portable 1v  09/27/2015   CLINICAL DATA:  Enteric tube placement  EXAM: PORTABLE ABDOMEN - 1 VIEW  COMPARISON:  09/15/2015 CT abdomen/pelvis .  FINDINGS: Enteric tube terminates in the body of the stomach. No dilated small bowel loops. No evidence of pneumatosis or pneumoperitoneum. Vascular calcifications are noted in the pelvis. Patchy opacity is present at the left lung base. Degenerative changes throughout the visualized thoracolumbar spine.  IMPRESSION: Enteric tube terminates in the body of the stomach. Nonobstructive bowel gas pattern.   Electronically Signed   By: Ilona Sorrel M.D.   On: 09/27/2015 13:43    Labs: BMET  Recent Labs Lab 09/19/2015 1935  09/25/15 0400 09/25/15 1524 09/25/2015 0540 09/27/15 0152 09/27/15 0522 09/28/15 0347 09/29/15 0511  NA  --   < > 139 140 139 140 139 143 143  K  --   < > 6.2* 5.5* 4.3 3.6 3.9 3.3* 3.7  CL  --   < >  103 103 100* 96* 95* 94* 92*  CO2  --   < > 14* 18* 24 27 28 31  33*  GLUCOSE  --   < > 145* 148* 136* 174* 161* 164* 160*  BUN  --   < > 96* 106* 113* 120* 118* 124* 128*  CREATININE  --   < > 3.47* 4.23* 4.72* 4.94* 4.95* 5.34* 5.87*  CALCIUM  --   < > 7.3* 7.0* 6.0* 5.9* 5.8* 6.2* 6.6*  PHOS 11.0*  --  11.1*  --  7.1* 6.2* 6.3* 6.2* 6.4*  < > = values in this interval not  displayed. CBC  Recent Labs Lab 09/02/2015 0539 09/27/15 0522 09/28/15 0319 09/29/15 0511  WBC 14.2* 23.0* 25.3* 27.7*  NEUTROABS 13.3* 21.9* 24.0* 27.1*  HGB 9.9* 9.7* 9.6* 10.0*  HCT 31.0* 30.3* 30.0* 31.0*  MCV 75.4* 74.6* 73.9* 74.5*  PLT 258 192 125* 74*    Medications:    . [COMPLETED] sodium chloride   Intravenous Once  . acetaminophen  650 mg Oral Once  . acyclovir  400 mg Per Tube Daily  . allopurinol  100 mg Per Tube Daily  . antiseptic oral rinse  7 mL Mouth Rinse QID  . brentuximab (ADCETRIS) CHEMO infusion  100 mg Intravenous Once  . chlorhexidine gluconate  15 mL Mouth Rinse BID  . diphenhydrAMINE  50 mg Intravenous Once  . ipratropium-albuterol  3 mL Nebulization Q6H  . nicotine  7 mg Transdermal Daily  . ondansetron (ZOFRAN) IVPB [facility] +/- dexamethasone   Intravenous Once  . pantoprazole (PROTONIX) IV  40 mg Intravenous Q12H  . piperacillin-tazobactam (ZOSYN)  IV  2.25 g Intravenous Q8H      Albin Felling, MD, MPH Internal Medicine Resident, PGY-II Pager: [phone]   09/29/2015, 11:57 AM

## 2015-09-29 NOTE — Progress Notes (Signed)
Pt sat dropped to 86 om nasal cannula. RT increased Lpm to 6 and sat recovered to 91%. RT placed pt on venturi mask 8Lpm - FIO2 40 %, sat recovered to 96%. RT will notify RN.

## 2015-09-30 ENCOUNTER — Inpatient Hospital Stay (HOSPITAL_COMMUNITY): Payer: BLUE CROSS/BLUE SHIELD

## 2015-09-30 DIAGNOSIS — D696 Thrombocytopenia, unspecified: Secondary | ICD-10-CM

## 2015-09-30 LAB — BASIC METABOLIC PANEL
ANION GAP: 21 — AB (ref 5–15)
BUN: 156 mg/dL — ABNORMAL HIGH (ref 6–20)
CHLORIDE: 90 mmol/L — AB (ref 101–111)
CO2: 31 mmol/L (ref 22–32)
Calcium: 6.7 mg/dL — ABNORMAL LOW (ref 8.9–10.3)
Creatinine, Ser: 6.29 mg/dL — ABNORMAL HIGH (ref 0.61–1.24)
GFR, EST AFRICAN AMERICAN: 10 mL/min — AB (ref >60)
GFR, EST NON AFRICAN AMERICAN: 9 mL/min — AB (ref >60)
Glucose, Bld: 152 mg/dL — ABNORMAL HIGH (ref 65–99)
POTASSIUM: 4.6 mmol/L (ref 3.5–5.1)
SODIUM: 142 mmol/L (ref 135–145)

## 2015-09-30 LAB — CULTURE, BLOOD (ROUTINE X 2)
Culture: NO GROWTH
Culture: NO GROWTH

## 2015-09-30 LAB — BLOOD GAS, ARTERIAL
Acid-Base Excess: 3.8 mmol/L — ABNORMAL HIGH (ref 0.0–2.0)
Acid-Base Excess: 4.8 mmol/L — ABNORMAL HIGH (ref 0.0–2.0)
BICARBONATE: 26.5 meq/L — AB (ref 20.0–24.0)
BICARBONATE: 28.2 meq/L — AB (ref 20.0–24.0)
Drawn by: 358491
Drawn by: 358491
FIO2: 0.6
FIO2: 0.6
LHR: 26 {breaths}/min
MECHVT: 610 mL
O2 SAT: 99 %
O2 Saturation: 98.6 %
PATIENT TEMPERATURE: 98.6
PCO2 ART: 37.3 mmHg (ref 35.0–45.0)
PEEP/CPAP: 10 cmH2O
PEEP: 10 cmH2O
PH ART: 7.539 — AB (ref 7.350–7.450)
PH ART: 7.491 — AB (ref 7.350–7.450)
Patient temperature: 98.6
RATE: 32 resp/min
TCO2: 27.4 mmol/L (ref 0–100)
TCO2: 29.3 mmol/L (ref 0–100)
VT: 540 mL
pCO2 arterial: 31.1 mmHg — ABNORMAL LOW (ref 35.0–45.0)
pO2, Arterial: 106 mmHg — ABNORMAL HIGH (ref 80.0–100.0)
pO2, Arterial: 129 mmHg — ABNORMAL HIGH (ref 80.0–100.0)

## 2015-09-30 LAB — CARBOXYHEMOGLOBIN
CARBOXYHEMOGLOBIN: 1.6 % — AB (ref 0.5–1.5)
CARBOXYHEMOGLOBIN: 1.5 % (ref 0.5–1.5)
METHEMOGLOBIN: 0.9 % (ref 0.0–1.5)
METHEMOGLOBIN: 0.9 % (ref 0.0–1.5)
O2 SAT: 48.3 %
O2 Saturation: 48.5 %
TOTAL HEMOGLOBIN: 10.6 g/dL — AB (ref 13.5–18.0)
Total hemoglobin: 10.8 g/dL — ABNORMAL LOW (ref 13.5–18.0)

## 2015-09-30 LAB — HEPATIC FUNCTION PANEL
ALK PHOS: 193 U/L — AB (ref 38–126)
ALT: 140 U/L — AB (ref 17–63)
AST: 77 U/L — AB (ref 15–41)
Albumin: 1.9 g/dL — ABNORMAL LOW (ref 3.5–5.0)
BILIRUBIN INDIRECT: 2.2 mg/dL — AB (ref 0.3–0.9)
Bilirubin, Direct: 3.8 mg/dL — ABNORMAL HIGH (ref 0.1–0.5)
TOTAL PROTEIN: 5.6 g/dL — AB (ref 6.5–8.1)
Total Bilirubin: 6 mg/dL — ABNORMAL HIGH (ref 0.3–1.2)

## 2015-09-30 LAB — POCT I-STAT 3, ART BLOOD GAS (G3+)
Acid-Base Excess: 6 mmol/L — ABNORMAL HIGH (ref 0.0–2.0)
BICARBONATE: 31.1 meq/L — AB (ref 20.0–24.0)
O2 SAT: 94 %
PCO2 ART: 45.3 mmHg — AB (ref 35.0–45.0)
Patient temperature: 97.7
TCO2: 33 mmol/L (ref 0–100)
pH, Arterial: 7.443 (ref 7.350–7.450)
pO2, Arterial: 67 mmHg — ABNORMAL LOW (ref 80.0–100.0)

## 2015-09-30 LAB — PROCALCITONIN: PROCALCITONIN: 11.8 ng/mL

## 2015-09-30 LAB — TRIGLYCERIDES: Triglycerides: 316 mg/dL — ABNORMAL HIGH (ref <150)

## 2015-09-30 LAB — PROTIME-INR
INR: 1.67 — ABNORMAL HIGH (ref 0.00–1.49)
Prothrombin Time: 19.7 seconds — ABNORMAL HIGH (ref 11.6–15.2)

## 2015-09-30 LAB — URIC ACID: URIC ACID, SERUM: 2.1 mg/dL — AB (ref 4.4–7.6)

## 2015-09-30 LAB — APTT: APTT: 31 s (ref 24–37)

## 2015-09-30 LAB — PHOSPHORUS: PHOSPHORUS: 10.1 mg/dL — AB (ref 2.5–4.6)

## 2015-09-30 MED ORDER — DOCUSATE SODIUM 50 MG/5ML PO LIQD: 100.0000 mg | Freq: Two times a day (BID) | ORAL | Status: DC | PRN

## 2015-09-30 MED ORDER — ACYCLOVIR 200 MG PO CAPS
400.0000 mg | ORAL_CAPSULE | Freq: Every day | ORAL | Status: DC
  Administered 2015-10-01 – 2015-10-13 (×13): 400 mg
  Filled 2015-09-30 (×13): qty 2

## 2015-09-30 MED ORDER — CHLORHEXIDINE GLUCONATE 0.12% ORAL RINSE (MEDLINE KIT)
15.0000 mL | Freq: Two times a day (BID) | OROMUCOSAL | Status: DC
  Administered 2015-09-30 – 2015-10-02 (×3): 15 mL via OROMUCOSAL

## 2015-09-30 MED ORDER — CHLORHEXIDINE GLUCONATE 0.12% ORAL RINSE (MEDLINE KIT): 15.0000 mL | Freq: Two times a day (BID) | OROMUCOSAL | Status: DC

## 2015-09-30 MED ORDER — NOREPINEPHRINE BITARTRATE 1 MG/ML IV SOLN
2.0000 ug/min | INTRAVENOUS | Status: AC
  Administered 2015-09-30: 5 ug/min via INTRAVENOUS
  Administered 2015-09-30 – 2015-10-01 (×4): 10 ug/min via INTRAVENOUS
  Administered 2015-10-02: 8 ug/min via INTRAVENOUS
  Administered 2015-10-02: 10 ug/min via INTRAVENOUS
  Administered 2015-10-02: 8 ug/min via INTRAVENOUS
  Administered 2015-10-03: 4 ug/min via INTRAVENOUS
  Administered 2015-10-04: 10 ug/min via INTRAVENOUS
  Administered 2015-10-04: 6 ug/min via INTRAVENOUS
  Administered 2015-10-04: 10 ug/min via INTRAVENOUS
  Filled 2015-09-30 (×14): qty 4

## 2015-09-30 MED ORDER — FENTANYL CITRATE (PF) 100 MCG/2ML IJ SOLN
INTRAMUSCULAR | Status: AC
  Administered 2015-09-30: 100 ug
  Filled 2015-09-30: qty 4

## 2015-09-30 MED ORDER — MIDAZOLAM HCL 2 MG/2ML IJ SOLN
4.0000 mg | Freq: Once | INTRAMUSCULAR | Status: AC
  Administered 2015-09-30: 4 mg via INTRAVENOUS

## 2015-09-30 MED ORDER — ALLOPURINOL 100 MG PO TABS
100.0000 mg | ORAL_TABLET | Freq: Every day | ORAL | Status: DC
  Administered 2015-10-01 – 2015-10-05 (×5): 100 mg
  Filled 2015-09-30 (×5): qty 1

## 2015-09-30 MED ORDER — ANTISEPTIC ORAL RINSE SOLUTION (CORINZ)
7.0000 mL | OROMUCOSAL | Status: DC
  Administered 2015-09-30 (×2): 7 mL via OROMUCOSAL

## 2015-09-30 MED ORDER — PROPOFOL 1000 MG/100ML IV EMUL
0.0000 ug/kg/min | INTRAVENOUS | Status: DC
  Administered 2015-09-30: 20 ug/kg/min via INTRAVENOUS
  Administered 2015-09-30 – 2015-10-01 (×2): 30 ug/kg/min via INTRAVENOUS
  Administered 2015-10-01: 40 ug/kg/min via INTRAVENOUS
  Administered 2015-10-01: 20 ug/kg/min via INTRAVENOUS
  Administered 2015-10-02: 40 ug/kg/min via INTRAVENOUS
  Administered 2015-10-02: 30 ug/kg/min via INTRAVENOUS
  Administered 2015-10-02: 40 ug/kg/min via INTRAVENOUS
  Administered 2015-10-02: 35 ug/kg/min via INTRAVENOUS
  Administered 2015-10-02: 40 ug/kg/min via INTRAVENOUS
  Administered 2015-10-03 (×2): 50 ug/kg/min via INTRAVENOUS
  Administered 2015-10-03: 40 ug/kg/min via INTRAVENOUS
  Filled 2015-09-30 (×15): qty 100

## 2015-09-30 MED ORDER — LORAZEPAM 2 MG/ML IJ SOLN
1.0000 mg | Freq: Once | INTRAMUSCULAR | Status: AC
  Administered 2015-09-30: 1 mg via INTRAVENOUS
  Filled 2015-09-30: qty 1

## 2015-09-30 MED ORDER — MIDAZOLAM HCL 2 MG/2ML IJ SOLN
INTRAMUSCULAR | Status: AC
  Administered 2015-09-30: 4 mg via INTRAVENOUS
  Filled 2015-09-30: qty 4

## 2015-09-30 MED ORDER — ANTISEPTIC ORAL RINSE SOLUTION (CORINZ)
7.0000 mL | OROMUCOSAL | Status: DC
  Administered 2015-10-01 – 2015-10-02 (×11): 7 mL via OROMUCOSAL

## 2015-09-30 MED ORDER — FENTANYL CITRATE (PF) 100 MCG/2ML IJ SOLN
100.0000 ug | INTRAMUSCULAR | Status: DC | PRN
  Administered 2015-09-30 (×2): 100 ug via INTRAVENOUS
  Filled 2015-09-30: qty 2

## 2015-09-30 MED ORDER — FUROSEMIDE 10 MG/ML IJ SOLN
160.0000 mg | Freq: Three times a day (TID) | INTRAVENOUS | Status: DC
  Administered 2015-09-30 – 2015-10-01 (×3): 160 mg via INTRAVENOUS
  Filled 2015-09-30 (×6): qty 16

## 2015-09-30 MED ORDER — PANTOPRAZOLE SODIUM 40 MG PO PACK
40.0000 mg | PACK | Freq: Two times a day (BID) | ORAL | Status: DC
  Filled 2015-09-30 (×2): qty 20

## 2015-09-30 MED ORDER — PANTOPRAZOLE SODIUM 40 MG IV SOLR
40.0000 mg | Freq: Two times a day (BID) | INTRAVENOUS | Status: DC
  Administered 2015-09-30 – 2015-10-02 (×4): 40 mg via INTRAVENOUS
  Filled 2015-09-30 (×4): qty 40

## 2015-09-30 MED ORDER — ANTISEPTIC ORAL RINSE SOLUTION (CORINZ)
7.0000 mL | OROMUCOSAL | Status: DC
Start: 2015-09-30 — End: 2015-09-30

## 2015-09-30 MED ORDER — ETOMIDATE 2 MG/ML IV SOLN
20.0000 mg | Freq: Once | INTRAVENOUS | Status: AC
  Administered 2015-09-30: 20 mg via INTRAVENOUS

## 2015-09-30 MED ORDER — FENTANYL CITRATE (PF) 100 MCG/2ML IJ SOLN
100.0000 ug | INTRAMUSCULAR | Status: DC | PRN
  Administered 2015-10-01 – 2015-10-02 (×3): 100 ug via INTRAVENOUS
  Filled 2015-09-30 (×4): qty 2

## 2015-09-30 MED ORDER — FENTANYL CITRATE (PF) 100 MCG/2ML IJ SOLN: 100.0000 ug | Freq: Once | INTRAMUSCULAR | Status: AC

## 2015-09-30 MED ORDER — VITAL HIGH PROTEIN PO LIQD
1000.0000 mL | ORAL | Status: DC
  Administered 2015-10-01: 1000 mL
  Filled 2015-09-30 (×4): qty 1000

## 2015-09-30 NOTE — Progress Notes (Signed)
Paul Morton   DOB:03/31/1961   KL#:[id number]   WPV#:[id number]  Subjective: Alert. Complains of dry mouth but otherwise ok. Family present.   Chart extensively reviewed.    Objective:   GENERAL: Pleasant, on bipap HEENT: unremarkable. L cervical biopsy site C/D/I, prominent adenopathy noted PULM:  Coarse breath sounds throughout, anteriorly CARDIO: tachycardic, S1/S2 audible ABDOMEN: SOFT, NT, ND no rebound, no guarding, BS present EXTREMITIES: No edema, SCD's in place SKIN: no rash noted NEURO: alert, oriented, cooperative   Filed Vitals:   09/30/15 0847  BP: 97/70  Pulse: 119  Temp:   Resp: 24    Body mass index is 25.8 kg/(m^2).  Intake/Output Summary (Last 24 hours) at 09/30/15 1105 Last data filed at 09/30/15 0600  Gross per 24 hour  Intake 501.22 ml  Output    740 ml  Net -238.78 ml   CBG (last 3)   Recent Labs  09/29/15 0351 09/29/15 0814 09/29/15 1141  GLUCAP 162* 164* 167*    Labs:  Lab Results  Component Value Date   WBC 27.7* 09/29/2015   HGB 10.0* 09/29/2015   HCT 31.0* 09/29/2015   MCV 74.5* 09/29/2015   PLT 74* 09/29/2015   NEUTROABS 27.1* 74/82/7078    Basic Metabolic Panel:  Recent Labs Lab 09/28/2015 0539  09/27/15 0152 09/27/15 0522 09/28/15 0319 09/28/15 0347 09/29/15 0511 09/30/15 0436  NA  --   < > 140 139  --  143 143 142  K  --   < > 3.6 3.9  --  3.3* 3.7 4.6  CL  --   < > 96* 95*  --  94* 92* 90*  CO2  --   < > 27 28  --  31 33* 31  GLUCOSE  --   < > 174* 161*  --  164* 160* 152*  BUN  --   < > 120* 118*  --  124* 128* 156*  CREATININE  --   < > 4.94* 4.95*  --  5.34* 5.87* 6.29*  CALCIUM  --   < > 5.9* 5.8*  --  6.2* 6.6* 6.7*  MG 2.5*  --  2.5* 2.5* 2.4  --  2.4  --   PHOS  --   < > 6.2* 6.3*  --  6.2* 6.4* 10.1*  < > = values in this interval not displayed. GFR Estimated Creatinine Clearance: 14.4 mL/min (by C-G formula based on Cr of 6.29). Liver Function Tests:  Recent Labs Lab 09/11/2015 0539   09/27/15 0522 09/28/15 0319 09/28/15 0347 09/29/15 0511 09/30/15 0436  AST 516*  --  251* 151*  --  104* 77*  ALT 290*  --  226* 190*  --  160* 140*  ALKPHOS 173*  --  169* 160*  --  168* 193*  BILITOT 5.8*  --  7.1* 6.4*  --  5.9* 6.0*  PROT 5.2*  --  5.4* 5.8*  --  5.9* 5.6*  ALBUMIN 1.9*  < > 2.0*  2.0* 2.0* 1.9* 2.0*  2.0* 1.9*  < > = values in this interval not displayed. No results for input(s): LIPASE, AMYLASE in the last 168 hours.  Recent Labs Lab 09/25/15 1523  AMMONIA 37*   Coagulation profile  Recent Labs Lab 09/02/2015 1215 09/27/15 0522 09/28/15 0319 09/29/15 0511 09/30/15 0436  INR 1.97* 1.73* 1.72* 1.65* 1.67*    CBC:  Recent Labs Lab 09/25/15 1524 09/06/2015 0539 09/27/15 0522 09/28/15 0319 09/29/15 0511  WBC 15.0* 14.2* 23.0* 25.3* 27.7*  NEUTROABS 13.7* 13.3* 21.9* 24.0* 27.1*  HGB 11.0* 9.9* 9.7* 9.6* 10.0*  HCT 34.9* 31.0* 30.3* 30.0* 31.0*  MCV 78.3 75.4* 74.6* 73.9* 74.5*  PLT 327 258 192 125* 74*     Recent Labs Lab 09/05/2015 1630 09/25/15 0400 09/25/15 0800 09/25/15 1524 09/02/2015 0539 09/27/15 0152  CKTOTAL 1533* 1111* 1052* 938* 674*  --   CKMB  --   --  73.5*  --   --   --   TROPONINI  --   --   --   --   --  0.09*     Recent Labs Lab 09/28/15 1937 09/28/15 2354 09/29/15 0351 09/29/15 0814 09/29/15 1141  GLUCAP 146* 164* 162* 164* 167*    Microbiology Recent Results (from the past 240 hour(s))  Blood Culture (routine x 2)     Status: None (Preliminary result)   Collection Time: 09/01/2015  6:20 PM  Result Value Ref Range Status   Specimen Description BLOOD CENTRAL LINE  Final   Special Requests BOTTLES DRAWN AEROBIC AND ANAEROBIC [place] EA  Final   Culture   Final    NO GROWTH 4 DAYS Performed at [facility]    Report Status PENDING  Incomplete  Urine culture     Status: None   Collection Time: 09/07/2015  8:06 PM  Result Value Ref Range Status   Specimen Description URINE, RANDOM  Final   Special  Requests NONE  Final   Culture   Final    NO GROWTH 1 DAY Performed at [facility]    Report Status 09/24/2015 FINAL  Final  MRSA PCR Screening     Status: None   Collection Time: 09/08/2015  9:23 PM  Result Value Ref Range Status   MRSA by PCR NEGATIVE NEGATIVE Final    Comment:        The GeneXpert MRSA Assay (FDA approved for NASAL specimens only), is one component of a comprehensive MRSA colonization surveillance program. It is not intended to diagnose MRSA infection nor to guide or monitor treatment for MRSA infections.   Blood Culture (routine x 2)     Status: None (Preliminary result)   Collection Time: 09/20/2015 10:37 PM  Result Value Ref Range Status   Specimen Description BLOOD RIGHT ARM  Final   Special Requests BOTTLES DRAWN AEROBIC AND ANAEROBIC 3CC  Final   Culture   Final    NO GROWTH 4 DAYS Performed at [facility]    Report Status PENDING  Incomplete  Culture, respiratory (NON-Expectorated)     Status: None   Collection Time: 09/27/15  3:04 AM  Result Value Ref Range Status   Specimen Description TRACHEAL ASPIRATE  Final   Special Requests Immunocompromised  Final   Gram Stain   Final    FEW WBC PRESENT,BOTH PMN AND MONONUCLEAR NO SQUAMOUS EPITHELIAL CELLS SEEN ABUNDANT YEAST Performed at [facility]    Culture   Final    ABUNDANT YEAST CONSISTENT WITH CANDIDA SPECIES Performed at [facility]    Report Status 09/29/2015 FINAL  Final     Studies:  Dg Chest Port 1 View  09/30/2015   CLINICAL DATA:  Hypoxemia  EXAM: PORTABLE CHEST 1 VIEW  COMPARISON:  Radiograph 09/25/2015  FINDINGS: No central venous line unchanged. Stable cardiac silhouette. There is interval increase in bilateral diffuse airspace disease. Small bilateral pleural effusions.  IMPRESSION: Worsening diffuse bilateral airspace disease consistent with worsening pulmonary edema versus less likely diffuse pulmonary infection.  These results will be called to  the ordering clinician or representative by the Radiologist Assistant, and communication documented in the PACS or zVision Dashboard.   Electronically Signed   By: Suzy Bouchard M.D.   On: 09/30/2015 08:16    Assessment/Plan Hodgkin Lymphoma PET/CT on 9/22 with bulky cervical LAD, hepatomegaly with diffuse liver hypermetabolism H/O alcohol and tobacco abuse ARF with good UOP EF 10-15%, cardiomyopathy with severely dilated RV Coagulopathy s/p vitamin K and 4U FFP UGIB  Patient given Brentuximab yesterday. Phosphorus and K are rising and hard to say if just secondary to kidney dysfunction or potential tumor lysis. (I suspect tumor lysis)  Will check uric acid. Tumor lysis is certainly a possibility with Brentuximab and may consider rasburicase if uric acid is markedly elevated. Worsening kidney function may be secondary to TLS.  Hard to say if his whole clinical picture is secondary to Hodgkin Lymphoma, Brentuximab really only choice for therapy but caution in patients with severe kidney dysfunction. NOTE: Additionally, the AUC of MMAE (component of ADCETRIS) was approximately 2-fold higher in patients with severe renal impairment compared to patients with normal renal function. Due to higher MMAE exposure, ?Grade 3 adverse reactions may be more frequent in patients with severe renal impairment compared to patients with normal renal function.  Will monitor closely for toxicity, particularly counts. Supportive care.  May consider neulasta which would need to be given within 72 hours of Brentuximab dosing.  Please call me with concerns/problems at [phone]  I am on call through Monday am. Total of one hour spent in patient chart review, and direct patient care.    Molli Hazard, MD 09/30/2015  11:05 AM

## 2015-09-30 NOTE — Progress Notes (Signed)
[facility] ROUNDING NOTE   Subjective:   Interval History: self extubated 9/30 now with respiratory distress and x ray appearance of pulmonary edema   Objective:  Vital signs in last 24 hours:  Temp:  [92.2 F (33.4 C)-99.9 F (37.7 C)] 98.2 F (36.8 C) (10/01 1100) Pulse Rate:  [117-130] 119 (10/01 1100) Resp:  [10-27] 17 (10/01 1211) BP: (91-107)/(62-77) 102/68 mmHg (10/01 1212) SpO2:  [84 %-100 %] 94 % (10/01 1211) FiO2 (%):  [40 %-100 %] 50 % (10/01 1211) Weight:  [85.1 kg (187 lb 9.8 oz)] 85.1 kg (187 lb 9.8 oz) (10/01 0500)  Weight change: 0 kg (0 lb) Filed Weights   09/28/15 0500 09/29/15 0500 09/30/15 0500  Weight: 90.3 kg (199 lb 1.2 oz) 85.1 kg (187 lb 9.8 oz) 85.1 kg (187 lb 9.8 oz)    Intake/Output: I/O last 3 completed shifts: In: 1565.1 [I.V.:1198.9; NG/GT:116.3; IV Piggyback:250] Out: 6578 [Urine:3540]   Intake/Output this shift:     General: Awake and alert, On BIPAP  Integument: Warm & dry.  HEENT: Dressing over neck on left postbiopsy is clean and dry.  Cardiovascular: Regular rhythm Pulmonary: Coarse breath sounds Abdomen: Soft. Normal bowel sounds. Nondistended. Nontender.  Neurological: Following commands   Basic Metabolic Panel:  Recent Labs Lab 09/17/2015 0539  09/27/15 0152 09/27/15 0522 09/28/15 0319 09/28/15 0347 09/29/15 0511 09/30/15 0436  NA  --   < > 140 139  --  143 143 142  K  --   < > 3.6 3.9  --  3.3* 3.7 4.6  CL  --   < > 96* 95*  --  94* 92* 90*  CO2  --   < > 27 28  --  31 33* 31  GLUCOSE  --   < > 174* 161*  --  164* 160* 152*  BUN  --   < > 120* 118*  --  124* 128* 156*  CREATININE  --   < > 4.94* 4.95*  --  5.34* 5.87* 6.29*  CALCIUM  --   < > 5.9* 5.8*  --  6.2* 6.6* 6.7*  MG 2.5*  --  2.5* 2.5* 2.4  --  2.4  --   PHOS  --   < > 6.2* 6.3*  --  6.2* 6.4* 10.1*  < > = values in this interval not displayed.  Liver Function Tests:  Recent Labs Lab 09/25/2015 0539  09/27/15 0522  09/28/15 0319 09/28/15 0347 09/29/15 0511 09/30/15 0436  AST 516*  --  251* 151*  --  104* 77*  ALT 290*  --  226* 190*  --  160* 140*  ALKPHOS 173*  --  169* 160*  --  168* 193*  BILITOT 5.8*  --  7.1* 6.4*  --  5.9* 6.0*  PROT 5.2*  --  5.4* 5.8*  --  5.9* 5.6*  ALBUMIN 1.9*  < > 2.0*  2.0* 2.0* 1.9* 2.0*  2.0* 1.9*  < > = values in this interval not displayed. No results for input(s): LIPASE, AMYLASE in the last 168 hours.  Recent Labs Lab 09/25/15 1523  AMMONIA 37*    CBC:  Recent Labs Lab 09/25/15 1524 09/05/2015 0539 09/27/15 0522 09/28/15 0319 09/29/15 0511  WBC 15.0* 14.2* 23.0* 25.3* 27.7*  NEUTROABS 13.7* 13.3* 21.9* 24.0* 27.1*  HGB 11.0* 9.9* 9.7* 9.6* 10.0*  HCT 34.9* 31.0* 30.3* 30.0* 31.0*  MCV 78.3 75.4* 74.6* 73.9* 74.5*  PLT 327 258 192 125* 74*  Cardiac Enzymes:  Recent Labs Lab 09/20/2015 1630 09/25/15 0400 09/25/15 0800 09/25/15 1524 09/29/2015 0539 09/27/15 0152  CKTOTAL 1533* 1111* 1052* 938* 674*  --   CKMB  --   --  73.5*  --   --   --   TROPONINI  --   --   --   --   --  0.09*    BNP: Invalid input(s): POCBNP  CBG:  Recent Labs Lab 09/28/15 1937 09/28/15 2354 09/29/15 0351 09/29/15 0814 09/29/15 1141  GLUCAP 146* 164* 162* 164* 167*    Microbiology: Results for orders placed or performed during the hospital encounter of 09/08/2015  Blood Culture (routine x 2)     Status: None (Preliminary result)   Collection Time: 09/23/2015  6:20 PM  Result Value Ref Range Status   Specimen Description BLOOD CENTRAL LINE  Final   Special Requests BOTTLES DRAWN AEROBIC AND ANAEROBIC [place] EA  Final   Culture   Final    NO GROWTH 4 DAYS Performed at [facility]    Report Status PENDING  Incomplete  Urine culture     Status: None   Collection Time: 09/16/2015  8:06 PM  Result Value Ref Range Status   Specimen Description URINE, RANDOM  Final   Special Requests NONE  Final   Culture   Final    NO GROWTH 1 DAY Performed at  [facility]    Report Status 09/18/2015 FINAL  Final  MRSA PCR Screening     Status: None   Collection Time: 09/27/2015  9:23 PM  Result Value Ref Range Status   MRSA by PCR NEGATIVE NEGATIVE Final    Comment:        The GeneXpert MRSA Assay (FDA approved for NASAL specimens only), is one component of a comprehensive MRSA colonization surveillance program. It is not intended to diagnose MRSA infection nor to guide or monitor treatment for MRSA infections.   Blood Culture (routine x 2)     Status: None (Preliminary result)   Collection Time: 09/03/2015 10:37 PM  Result Value Ref Range Status   Specimen Description BLOOD RIGHT ARM  Final   Special Requests BOTTLES DRAWN AEROBIC AND ANAEROBIC 3CC  Final   Culture   Final    NO GROWTH 4 DAYS Performed at [facility]    Report Status PENDING  Incomplete  Culture, respiratory (NON-Expectorated)     Status: None   Collection Time: 09/27/15  3:04 AM  Result Value Ref Range Status   Specimen Description TRACHEAL ASPIRATE  Final   Special Requests Immunocompromised  Final   Gram Stain   Final    FEW WBC PRESENT,BOTH PMN AND MONONUCLEAR NO SQUAMOUS EPITHELIAL CELLS SEEN ABUNDANT YEAST Performed at [facility]    Culture   Final    ABUNDANT YEAST CONSISTENT WITH CANDIDA SPECIES Performed at [facility]    Report Status 09/29/2015 FINAL  Final    Coagulation Studies:  Recent Labs  09/28/15 0319 09/29/15 0511 09/30/15 0436  LABPROT 20.1* 19.5* 19.7*  INR 1.72* 1.65* 1.67*    Urinalysis: No results for input(s): COLORURINE, LABSPEC, PHURINE, GLUCOSEU, HGBUR, BILIRUBINUR, KETONESUR, PROTEINUR, UROBILINOGEN, NITRITE, LEUKOCYTESUR in the last 72 hours.  Invalid input(s): APPERANCEUR    Imaging: Dg Chest Port 1 View  09/30/2015   CLINICAL DATA:  Hypoxemia  EXAM: PORTABLE CHEST 1 VIEW  COMPARISON:  Radiograph 09/25/2015  FINDINGS: No central venous line unchanged. Stable cardiac silhouette.  There is interval increase  in bilateral diffuse airspace disease. Small bilateral pleural effusions.  IMPRESSION: Worsening diffuse bilateral airspace disease consistent with worsening pulmonary edema versus less likely diffuse pulmonary infection.  These results will be called to the ordering clinician or representative by the Radiologist Assistant, and communication documented in the PACS or zVision Dashboard.   Electronically Signed   By: Suzy Bouchard M.D.   On: 09/30/2015 08:16     Medications:   . sodium chloride 10 mL/hr at 09/29/15 1900  . DOBUTamine 5 mcg/kg/min (09/30/15 0616)  . norepinephrine (LEVOPHED) Adult infusion     . acyclovir  400 mg Oral Daily  . allopurinol  100 mg Oral Daily  . furosemide  160 mg Intravenous 3 times per day  . ipratropium-albuterol  3 mL Nebulization Q6H  . nicotine  7 mg Transdermal Daily  . pantoprazole  40 mg Oral BID  . piperacillin-tazobactam (ZOSYN)  IV  2.25 g Intravenous Q8H   sodium chloride, albuterol, albuterol, alteplase, diphenhydrAMINE, diphenhydrAMINE, EPINEPHrine, EPINEPHrine, EPINEPHrine, EPINEPHrine, famotidine, heparin lock flush, heparin lock flush, methylPREDNISolone sodium succinate, ondansetron (ZOFRAN) IV, sodium chloride, sodium chloride  Assessment/ Plan:  1. AKI: Multifactorial in setting of volume depletion, NSAID use, likely rhabdomyolysis (CK 1533 on admission). Cr up slightly. UOP remains excellent.No emergent dialysis needs at this time. Continue IV Lasix.  2. Metabolic Acidosis/Anion Gap Metabolic Acidosis: Bicarb stable 3. Hypokalemia: Previously hyperkalemic now 4.6 4. Hypocalcemia: Continues to be hypocalcemic. Likely multifactorial in setting of shock, malignancy (tumor lysis syndrome? Calcium stable corrects up to normal range 5. Hyperphosphatemia: Ph stable at 6.2-6.3 range in setting of AKI, recent rhabdo. Continue to monitor.  6. Neck mass, likely Hodgkin's: Oncology following. Bedside FNA of left cervical  lymph node 9/27. Preliminary read with likely Hodgkin's disease.  7. 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 On milrinone and levophed drips. BPs in 47Q-259D systolic. Possible CAP, on Zosyn currently. Blood and urine cultures negative so far. Respiratory culture with abundant yeast. Management per HF team and PCCM.  8. UGIB: Secondary to erosive esophagitis. EGD performed on 9/27. GI signed off today. Currently on PPI drip, but can be switched to IV PPI today. Hgb stable at 9.6.  9. Acute Bleeding in Setting of Iron Deficiency Anemia: Anemia panel on 9/13 shows low iron 12 and low saturation 5%. Ferritin elevated at 350 but also acute phase reactant in setting of likely malignancy. Baseline Hgb in 9-10 range as outpatient. Transfuse for Hgb < 7.0. Hgb stable.     LOS: 6 Paul Morton @TODAY @12 :52 PM

## 2015-09-30 NOTE — Progress Notes (Signed)
Placed pt. On bipap as per order. Pt. Tolerating well at this time.

## 2015-09-30 NOTE — Procedures (Signed)
Intubation Procedure Note Paul Morton [id number] 05/05/1961  Procedure: Intubation Indications: Respiratory insufficiency  Procedure Details Consent: Risks of procedure as well as the alternatives and risks of each were explained to the (patient/caregiver).  Consent for procedure obtained. Time Out: Verified patient identification, verified procedure, site/side was marked, verified correct patient position, special equipment/implants available, medications/allergies/relevent history reviewed, required imaging and test results available.  Performed  Maximum sterile technique was used including gloves and hand hygiene.  MAC and 3  Given 4 mg versed, 100 mcg fentanyl, 20 mg etomidate.  Used glidescope.  Inserted 7.5 ETT to 23 cm at lip.  Blood tinged secretions from ETT.   Evaluation Hemodynamic Status: BP stable throughout; O2 sats: transiently fell during during procedure Patient's Current Condition: stable Complications: No apparent complications Patient did tolerate procedure well. Chest X-ray ordered to verify placement.  CXR: pending.   Paul Mires, MD [facility] Pulmonary/Critical Care 09/30/2015, 2:02 PM Pager:  [phone] After 3pm call: [phone]

## 2015-09-30 NOTE — Progress Notes (Signed)
ANTIBIOTIC CONSULT NOTE - FOLLOW UP  Pharmacy Consult for Zosyn Indication: CAP   Allergies  Allergen Reactions  . Peanut-Containing Drug Products Anaphylaxis, Shortness Of Breath and Nausea And Vomiting    Patient Measurements: Height: 5' 11.5" (181.6 cm) Weight: 187 lb 9.8 oz (85.1 kg) IBW/kg (Calculated) : 76.45 As of 09/22/15: Height: 72 inches Weight: 87.5 kg  Vital Signs: Temp: 98.2 F (36.8 C) (10/01 1100) BP: 93/65 mmHg (10/01 1100) Pulse Rate: 119 (10/01 1100) Intake/Output from previous day: 09/30 0701 - 10/01 0700 In: 577.3 [I.V.:427.3; IV Piggyback:150] Out: 1790 [JKKXF:8182] Intake/Output from this shift:    Labs:  Recent Labs  09/28/15 0319 09/28/15 0347 09/29/15 0511 09/30/15 0436  WBC 25.3*  --  27.7*  --   HGB 9.6*  --  10.0*  --   PLT 125*  --  74*  --   CREATININE  --  5.34* 5.87* 6.29*   Estimated Creatinine Clearance: 14.4 mL/min (by C-G formula based on Cr of 6.29). No results for input(s): VANCOTROUGH, VANCOPEAK, VANCORANDOM, GENTTROUGH, GENTPEAK, GENTRANDOM, TOBRATROUGH, TOBRAPEAK, TOBRARND, AMIKACINPEAK, AMIKACINTROU, AMIKACIN in the last 72 hours.   Microbiology: Recent Results (from the past 720 hour(s))  Blood Culture (routine x 2)     Status: None (Preliminary result)   Collection Time: 09/17/2015  6:20 PM  Result Value Ref Range Status   Specimen Description BLOOD CENTRAL LINE  Final   Special Requests BOTTLES DRAWN AEROBIC AND ANAEROBIC [place] EA  Final   Culture   Final    NO GROWTH 4 DAYS Performed at [facility]    Report Status PENDING  Incomplete  Urine culture     Status: None   Collection Time: 09/29/2015  8:06 PM  Result Value Ref Range Status   Specimen Description URINE, RANDOM  Final   Special Requests NONE  Final   Culture   Final    NO GROWTH 1 DAY Performed at [facility]    Report Status 09/02/2015 FINAL  Final  MRSA PCR Screening     Status: None   Collection Time: 09/02/2015  9:23 PM  Result  Value Ref Range Status   MRSA by PCR NEGATIVE NEGATIVE Final    Comment:        The GeneXpert MRSA Assay (FDA approved for NASAL specimens only), is one component of a comprehensive MRSA colonization surveillance program. It is not intended to diagnose MRSA infection nor to guide or monitor treatment for MRSA infections.   Blood Culture (routine x 2)     Status: None (Preliminary result)   Collection Time: 09/16/2015 10:37 PM  Result Value Ref Range Status   Specimen Description BLOOD RIGHT ARM  Final   Special Requests BOTTLES DRAWN AEROBIC AND ANAEROBIC 3CC  Final   Culture   Final    NO GROWTH 4 DAYS Performed at [facility]    Report Status PENDING  Incomplete  Culture, respiratory (NON-Expectorated)     Status: None   Collection Time: 09/27/15  3:04 AM  Result Value Ref Range Status   Specimen Description TRACHEAL ASPIRATE  Final   Special Requests Immunocompromised  Final   Gram Stain   Final    FEW WBC PRESENT,BOTH PMN AND MONONUCLEAR NO SQUAMOUS EPITHELIAL CELLS SEEN ABUNDANT YEAST Performed at [facility]    Culture   Final    ABUNDANT YEAST CONSISTENT WITH CANDIDA SPECIES Performed at [facility]    Report Status 09/29/2015 FINAL  Final  Medical History: Past Medical History  Diagnosis Date  . Alcohol abuse 08/2015    admits to 6 pack beer daily.   . Mass of neck 05/2015    multiple soft tissue masses, palpable and seen on CT.  present since at leat 05/2015.   Marland Kitchen Anemia 08/2015    Iron deficient, microcytic  . Elevated LFTs 08/2015    acute hep ABC serologies negative.   Marland Kitchen Herpes zoster 03/2014    right C3-4, T1-2 distribution.   . Gynecomastia 08/2015    per PET scan  . Opacity of lung on imaging study 08/2015    bilateral, with effusion and mild ephysema per PET scan  . Diverticulosis of colon 08/2015    per PET scan  . Hepatomegaly 08/2015    and 1.6 cm liver cyst per PET.   Marland Kitchen Ascites 08/2015    small abd ascites and mild  anasarca per PET  . CHF (congestive heart failure) 08/2015    Severe dilatation of all of the heart chambers. EF 10 to 15%.  Severe LV and moderate RV systolic dysfunction. Restrictive pattern of diastolic dysfunction. Moderate aortic, mitral, pulmonic and tricuspid regurgitation. Mild to moderate pulmonary hypertension  . AKI (acute kidney injury) 08/2015  . 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 Coffee ground emesis 09/03/2015    grade c erosive esophagitis and small HH on EGD  . 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 acyclovir  400 mg Oral Daily  . allopurinol  100 mg Oral Daily  . furosemide  160 mg Intravenous 3 times per day  . ipratropium-albuterol  3 mL Nebulization Q6H  . nicotine  7 mg Transdermal Daily  . pantoprazole  40 mg Oral BID  . piperacillin-tazobactam (ZOSYN)  IV  2.25 g Intravenous Q8H   Infusions:  . sodium chloride 10 mL/hr at 09/29/15 1900  . DOBUTamine 5 mcg/kg/min (09/30/15 0616)  . norepinephrine (LEVOPHED) Adult infusion       Assessment: 54 yo male continuing on Zosyn for sepsis/PNA vs. UTI - Day #7. Afebrile, PCT up to 11.78, WBC up to 26.3, afebrile. Also on acyclovir 400mg  po qd - dosing per MD  9/25 >> Vanc >> 9/26 9/25 >> Zosyn >>  9/25 blood x 2: NGF 9/25 urine: NegF  9/28 Trach asp>>abd yeast 10/1 trach asp>> Renal function continues to worsen, SCr up to 7.11 CrCl~12. Off steroids 9/29  9/25 >> Vanc >> 9/26 9/25 >> Zosyn >>  9/25 blood x 2: NGTD  9/25 urine: NegF  9/28 Trach asp>>abundant yeast  Goal of Therapy:  Eradication of infection  Plan:  -Zosyn 2.25g IV q8h  -Monitor clinical progress, c/s, renal function, abx LOT/plan -F/u renal plans   Elicia Lamp, PharmD Clinical Pharmacist Pager [phone] 09/30/2015 12:08 PM

## 2015-09-30 NOTE — Progress Notes (Addendum)
Advanced Heart Failure Team Rounding Note  Referring Physician: Dr Vaughan Browner Primary Physician: Primary Cardiologist:  None Oncologist: Dr Marin Olp  Reason for Consultation: Acute Systolic Heart Failure   HPI:    Paul Morton is a 54 year old male with h/o tobacco and ETOH abuse who recently developed large cervical adenopathy and intractable back pain. Underwent PET scan earlier this month which showed hypermetabolic left cervical lymphadenopathy involving levels II-IV and presence of hepatomegaly with diffuse liver hypermetabolism suggesting probably lymphoma.   Admitted 9/26 with multisystem organ failure. Echo performed and EF 10-15% with moderate RV dysfunction. Initial co-ox drawn was 55%. Started on milrinone. Intubated and transferred to [facility]. Developed large GI bleed. EGDwith esophagitis.   Underwent core biopsy of cervical node 9/27. -> appears to be Hodgkin's lymphoma  On 9/28 switched from milrinone to dobutamine 5 mcg with reduced CO-OX and worsening renal function. Self extubated 9/29.  Adcetria given 9/30 for Hodgkins.  On Bipap. Tachypneic and SOB. Co-ox 48%. CXR diffuse edema.   Creatinine 5.3> 5.87 > 6.3   Objective:    Vital Signs:   Temp:  [92.2 F (33.4 C)-99.9 F (37.7 C)] 97.3 F (36.3 C) (10/01 0800) Pulse Rate:  [117-131] 119 (10/01 0847) Resp:  [10-27] 24 (10/01 0847) BP: (91-107)/(62-78) 97/70 mmHg (10/01 0847) SpO2:  [84 %-100 %] 94 % (10/01 0800) FiO2 (%):  [40 %-100 %] 50 % (10/01 0847) Weight:  [85.1 kg (187 lb 9.8 oz)] 85.1 kg (187 lb 9.8 oz) (10/01 0500)    Weight change: Filed Weights   09/28/15 0500 09/29/15 0500 09/30/15 0500  Weight: 90.3 kg (199 lb 1.2 oz) 85.1 kg (187 lb 9.8 oz) 85.1 kg (187 lb 9.8 oz)    Intake/Output:   Intake/Output Summary (Last 24 hours) at 09/30/15 0947 Last data filed at 09/30/15 0600  Gross per 24 hour  Intake 536.16 ml  Output   1240 ml  Net -703.84 ml     Physical Exam: CVP 18-20 General:  On  bipap tachypneic HEENT: normal  Neck: supple. RIJ TLC . Prominent left neck lymphadenopathy Cor: PMI laterally displaced. Tachy regular + s3 Lungs: _ crackles  Abdomen: soft, nontender, mildly distended. No bruits or masses. Good bowel sounds. Extremities: no cyanosis, clubbing, rash, no edema Neuro: Alert and oriented.  Telemetry: sinus tach 120-130s  Labs: Basic Metabolic Panel:  Recent Labs Lab 09/06/2015 0539  09/27/15 0152 09/27/15 0522 09/28/15 0319 09/28/15 0347 09/29/15 0511 09/30/15 0436  NA  --   < > 140 139  --  143 143 142  K  --   < > 3.6 3.9  --  3.3* 3.7 4.6  CL  --   < > 96* 95*  --  94* 92* 90*  CO2  --   < > 27 28  --  31 33* 31  GLUCOSE  --   < > 174* 161*  --  164* 160* 152*  BUN  --   < > 120* 118*  --  124* 128* 156*  CREATININE  --   < > 4.94* 4.95*  --  5.34* 5.87* 6.29*  CALCIUM  --   < > 5.9* 5.8*  --  6.2* 6.6* 6.7*  MG 2.5*  --  2.5* 2.5* 2.4  --  2.4  --   PHOS  --   < > 6.2* 6.3*  --  6.2* 6.4* 10.1*  < > = values in this interval not displayed.  Liver Function Tests:  Recent Labs  Lab 09/17/2015 0539  09/27/15 0522 09/28/15 0319 09/28/15 0347 09/29/15 0511 09/30/15 0436  AST 516*  --  251* 151*  --  104* 77*  ALT 290*  --  226* 190*  --  160* 140*  ALKPHOS 173*  --  169* 160*  --  168* 193*  BILITOT 5.8*  --  7.1* 6.4*  --  5.9* 6.0*  PROT 5.2*  --  5.4* 5.8*  --  5.9* 5.6*  ALBUMIN 1.9*  < > 2.0*  2.0* 2.0* 1.9* 2.0*  2.0* 1.9*  < > = values in this interval not displayed. No results for input(s): LIPASE, AMYLASE in the last 168 hours.  Recent Labs Lab 09/25/15 1523  AMMONIA 37*    CBC:  Recent Labs Lab 09/25/15 1524 09/25/2015 0539 09/27/15 0522 09/28/15 0319 09/29/15 0511  WBC 15.0* 14.2* 23.0* 25.3* 27.7*  NEUTROABS 13.7* 13.3* 21.9* 24.0* 27.1*  HGB 11.0* 9.9* 9.7* 9.6* 10.0*  HCT 34.9* 31.0* 30.3* 30.0* 31.0*  MCV 78.3 75.4* 74.6* 73.9* 74.5*  PLT 327 258 192 125* 74*    Cardiac Enzymes:  Recent Labs Lab  09/10/2015 1630 09/25/15 0400 09/25/15 0800 09/25/15 1524 09/27/2015 0539 09/27/15 0152  CKTOTAL 1533* 1111* 1052* 938* 674*  --   CKMB  --   --  73.5*  --   --   --   TROPONINI  --   --   --   --   --  0.09*    BNP: BNP (last 3 results) No results for input(s): BNP in the last 8760 hours.  ProBNP (last 3 results) No results for input(s): PROBNP in the last 8760 hours.   CBG:  Recent Labs Lab 09/28/15 1937 09/28/15 2354 09/29/15 0351 09/29/15 0814 09/29/15 1141  GLUCAP 146* 164* 162* 164* 167*    Coagulation Studies:  Recent Labs  09/28/15 0319 09/29/15 0511 09/30/15 0436  LABPROT 20.1* 19.5* 19.7*  INR 1.72* 1.65* 1.67*    Other results: EKG: ST 107 RBBB/LAFB  Imaging: Dg Chest Port 1 View  09/30/2015   CLINICAL DATA:  Hypoxemia  EXAM: PORTABLE CHEST 1 VIEW  COMPARISON:  Radiograph 09/25/2015  FINDINGS: No central venous line unchanged. Stable cardiac silhouette. There is interval increase in bilateral diffuse airspace disease. Small bilateral pleural effusions.  IMPRESSION: Worsening diffuse bilateral airspace disease consistent with worsening pulmonary edema versus less likely diffuse pulmonary infection.  These results will be called to the ordering clinician or representative by the Radiologist Assistant, and communication documented in the PACS or zVision Dashboard.   Electronically Signed   By: Suzy Bouchard M.D.   On: 09/30/2015 08:16     Medications:     Current Medications: . acyclovir  400 mg Oral Daily  . allopurinol  100 mg Oral Daily  . ipratropium-albuterol  3 mL Nebulization Q6H  . nicotine  7 mg Transdermal Daily  . pantoprazole  40 mg Oral BID  . piperacillin-tazobactam (ZOSYN)  IV  2.25 g Intravenous Q8H    Infusions: . sodium chloride 10 mL/hr at 09/29/15 1900  . DOBUTamine 5 mcg/kg/min (09/30/15 0616)  . norepinephrine (LEVOPHED) Adult infusion Stopped (09/30/15 0235)     Assessment:   1. Cardiogenic shock 2. Acute systolic  HF EF 47-42%, etiology uncertain.  Brother has dilated cardiomyopathy so possibly familial.  Reports drinking 6 beers/day long-term.  3. Acute respiratory failure 4. Multisystem organ failure 5. Acute kidney injury 6. Shock liver 7. Hyperkalemia 8. Cervical lymphadenopathy - path appears to be Hodgkins  lymphoma. Adcetria given 9/30    --s/p core biopsy 9/27 9. Tobacco use 10. ETOH use - 4+ beers per day 11. Family h/o NICM in brother 12. Massive upper GI bleed     --severe esophagitis by EGD on 9/27 13. Coagulopathy - due to shock liver with ? DIC 14. ?CAP  Plan/Discussion:    He is worse today. More respiratory distress. Co-ox down despite dobutamine. Renal function worse. CVP up.   I am not sure why renal function getting worse after initial improvement.   Will add back norepi to try to get co-ox to 60% or greater. Give lasix 160 IV q8 (if ok with Renal). I am worried he may need to be re-intubated and also may need CVVHD to maintain volume status. D/w CCM and family.   The patient is critically ill with multiple organ systems failure and requires high complexity decision making for assessment and support, frequent evaluation and titration of therapies, application of advanced monitoring technologies and extensive interpretation of multiple databases.   Critical Care Time devoted to patient care services described in this note is 35 Minutes.   Glori Bickers MD 09/30/2015 9:47 AM Advanced Heart Failure Team Pager [phone] (M-F; 7a - 4p)  Please contact [facility] for night-coverage after hours (4p -7a ) and weekends on [email or web address]

## 2015-09-30 NOTE — Progress Notes (Addendum)
PULMONARY / CRITICAL CARE MEDICINE   Name: Paul Morton MRN: [id number] DOB: 1961-06-19    ADMISSION DATE:  09/23/2015  REFERRING MD :  EDP  CHIEF COMPLAINT:  Sepsis   INITIAL PRESENTATION: 54yo male smoker with recent diagnosis lymphoma (still in early stages of workup) who presented 9/25 with progressive back pain.  In ER found to be tachycardic, hypotensive with lactate 14, AKI and coagulopathy.  PCCM called to admit.    STUDIES:  PET 9/22 - Bulky confluent hypermetabolic left cervical lymphadenopathy involving levels II-V. Given the presence of hepatomegaly with diffuse liver hypermetabolism, a diagnosis of lymphoma is favored, however metastatic carcinoma remains on the differential.  Hypermetabolic patchy ground-glass opacity and consolidation in both upper lobes and right lower lobe, favor an infectious or inflammatory etiology.  TTE 9/26 - EF 10-15% w/o regional wall motion abnormality. Grade 3 diastolic dysfunction. Moderate AR. Moderate MR. RV severely dilated w/ moderate reduction in systolic function. PASP 43 mmHg. No pericardial effusion.  RLE Venous Duplex 9/26 - no DVT or SVT  MRI spine 9/25 - incomplete exam, No evidence of significant spinal stenosis or cord compression in the cervical or visualized thoracic spine.  MRI L-spine 9/27 - suboptimal exam. No malignant or suspicious signal.  SIGNIFICANT EVENTS: 9/25 - Admitted to hospital 9/25 - Right IJ CVC by ED 9/26 - Intubated for pending respiratory failure & transferred to [facility] 9/27 - EGD w/ erosive esophagitis 9/27 - FNA neck mass 9/30 - Self extubation during SBT  SUBJECTIVE:  Increased WOB despite BIPAP  CoOx down this am  Remains on Dobutamine , card added lasix drip and levophed.  Scr tend up  Renal would like to start CRRT    VITAL SIGNS: Temp:  [92.2 F (33.4 C)-99.9 F (37.7 C)] 97.3 F (36.3 C) (10/01 0800) Pulse Rate:  [117-130] 119 (10/01 0847) Resp:  [10-27] 24 (10/01 0847) BP:  (91-107)/(62-77) 97/70 mmHg (10/01 0847) SpO2:  [84 %-100 %] 94 % (10/01 0800) FiO2 (%):  [40 %-100 %] 50 % (10/01 0847) Weight:  [85.1 kg (187 lb 9.8 oz)] 85.1 kg (187 lb 9.8 oz) (10/01 0500) HEMODYNAMICS: CVP:  [7 mmHg-20 mmHg] 20 mmHg VENTILATOR SETTINGS: Vent Mode:  [-]  FiO2 (%):  [40 %-100 %] 50 % INTAKE / OUTPUT:  Intake/Output Summary (Last 24 hours) at 09/30/15 1021 Last data filed at 09/30/15 0600  Gross per 24 hour  Intake 522.65 ml  Output   1240 ml  Net -717.35 ml    PHYSICAL EXAMINATION: General:  Awake and alert, On BIPAP  Integument:  Warm & dry. No rash on exposed skin.  HEENT:  Dressing over neck on left postbiopsy is clean and dry.   Cardiovascular:  Regular rhythm. Tachycardic. No edema.  Pulmonary: Coarse breath sounds bilaterally  Abdomen: Soft. Normal bowel sounds. Nondistended. Nontender.  Neurological: Following commands.    LABS:  CBC  Recent Labs Lab 09/27/15 0522 09/28/15 0319 09/29/15 0511  WBC 23.0* 25.3* 27.7*  HGB 9.7* 9.6* 10.0*  HCT 30.3* 30.0* 31.0*  PLT 192 125* 74*   Coag's  Recent Labs Lab 09/28/15 0319 09/29/15 0511 09/30/15 0436  APTT 29 31 31   INR 1.72* 1.65* 1.67*   BMET  Recent Labs Lab 09/28/15 0347 09/29/15 0511 09/30/15 0436  NA 143 143 142  K 3.3* 3.7 4.6  CL 94* 92* 90*  CO2 31 33* 31  BUN 124* 128* 156*  CREATININE 5.34* 5.87* 6.29*  GLUCOSE 164* 160* 152*   Electrolytes  Recent Labs Lab 09/27/15 0522 09/28/15 0319 09/28/15 0347 09/29/15 0511 09/30/15 0436  CALCIUM 5.8*  --  6.2* 6.6* 6.7*  MG 2.5* 2.4  --  2.4  --   PHOS 6.3*  --  6.2* 6.4* 10.1*   Sepsis Markers  Recent Labs Lab 09/23/2015 2237  09/25/15 0800 09/18/2015 0541 09/27/15 0524  LATICACIDVEN 8.6*  < > 8.4* 2.2* 1.4  PROCALCITON 5.88  --   --   --   --   < > = values in this interval not displayed. ABG  Recent Labs Lab 09/25/15 1310 09/25/15 1712 09/30/15 0333  PHART 7.162* 7.311* 7.443  PCO2ART 41.4 34.8* 45.3*   PO2ART 352* 78.1* 67.0*   Liver Enzymes  Recent Labs Lab 09/28/15 0319 09/28/15 0347 09/29/15 0511 09/30/15 0436  AST 151*  --  104* 77*  ALT 190*  --  160* 140*  ALKPHOS 160*  --  168* 193*  BILITOT 6.4*  --  5.9* 6.0*  ALBUMIN 2.0* 1.9* 2.0*  2.0* 1.9*   Cardiac Enzymes  Recent Labs Lab 09/27/15 0152  TROPONINI 0.09*   Glucose  Recent Labs Lab 09/28/15 1543 09/28/15 1937 09/28/15 2354 09/29/15 0351 09/29/15 0814 09/29/15 1141  GLUCAP 146* 146* 164* 162* 164* 167*    Imaging Dg Chest Port 1 View  09/30/2015   CLINICAL DATA:  Hypoxemia  EXAM: PORTABLE CHEST 1 VIEW  COMPARISON:  Radiograph 09/25/2015  FINDINGS: No central venous line unchanged. Stable cardiac silhouette. There is interval increase in bilateral diffuse airspace disease. Small bilateral pleural effusions.  IMPRESSION: Worsening diffuse bilateral airspace disease consistent with worsening pulmonary edema versus less likely diffuse pulmonary infection.  These results will be called to the ordering clinician or representative by the Radiologist Assistant, and communication documented in the PACS or zVision Dashboard.   Electronically Signed   By: Suzy Bouchard M.D.   On: 09/30/2015 08:16    ASSESSMENT / PLAN:  PULMONARY OEET 9/26>>>9/30 (self extubated) A: Acute hypoxic respiratory failure  Possible CAP H/O Tobacco Use Pulmonary Edema  10/1>worsening edema on cxr , increased WOB on BIPAP   P:   Will need to reintubate with vent support  Vent bundle  Duonebs q6hr See ID section Nicotine patch   CARDIOVASCULAR CVL R IJ CVL (EDP) 9/25>>> A: Nonischemic Cardiomyopathy (echo EF 10%, Gr 3 DD ) with acute systolic CHF. Shock - Sepsis versus Cardiac in etiology  P:  Cardiology/Heart Failure Team consulted Dobutamine gtt Levophed gtt to maintain MAP >65 Lasix added 10/1 per cards   RENAL A: Acute Renal Failure - Worsening function Mild Hypokalemia - resolved Hyperkalemia -  resolved. Hyponatremia - resolved  Metabolic Acidosis - resolved Lactic acidosis - resolved  P:   Nephrology following rec CRRT 10/1  Monitoring UOP with Foley catheter Trending daily electrolytes  GASTROINTESTINAL A: Shock liver - improving Upper GIB - secondary to erosive esophagitis on EGD  P:   NPO  Trending LFTs daily Protonix IV bid GI consulted - s/p EGD Hepatitis A/B/C negative on serum  HEMATOLOGIC Coagulopathy - Unclear etiology but likely acute liver injury. Improving. S/P Vitamin K & 4u FFP. Leukocytosis - worsening Anemia - Secondary to upper GIB>improving  Classic Hodgkin lymphoma - FNA/core biopsy 9/27.  P:  Dr. Marin Olp following Trending Hgb w/ CBC daily Trending Coagulopathy SCDs Decadron 40mg  IV q24hr>stop 9/30  S/p Adcetris 9/30  INFECTIOUS A: Sepsis - Possible CAP  P:   Resp ctx 9/27>>>abundant yeast  BCx2 9/25>>> UC 9/25>>>negative  Vancomycin  9/25>>> Zosyn 9/25>>>  ENDOCRINE A: Hypoglycemia >resolved   P:   CBG q4 hr w/ parameters to notify MD  NEUROLOGIC A: Altered Mental Status - Improving. Toxic Metabolic Encephalopathy versus Dilaudid induced  Back pain - unclear etiology. MRI spine negative.  P:   Off fent/versed   FAMILY  - Updates:  No family present   TODAY'S SUMMARY: 53 year old male with left neck mass with cervical bx dx of classic Hodgkin lymphoma  Intubated on 9/26 for worsening hypoxic respiratory failure. Continuing on vasopressor & inotropic support. Patient self extubated 9/30 . Overnight with increased WOB despite BIPAP .  Seen by Oncology with Lanny Hurst given 9/30. Renal function is worse , Nephrology to start CRRT .  Card started Levophed and lasix drip with worsening CHF w/ pulm edema on CXR and CoOx low this am. Doubt will be able to avoid intubation . Will HD cath for CRRT. Will call wife with update.    Tammy Parrett NP-C  [facility]  [phone]    09/30/2015  10:21  AM   He has progressive respiratory distress.  He still has increased WOB even with BiPAP.  His renal fx is worse, and will need to start CRRT.  He has negative fluid balance, but CXR shows b/l infiltrates still and still on increased FiO2 >> ?if inflammatory/infection/malignancy.  Using accessory muscles, b/l crackles, decreased muscle mass.  Will proceed with intubation and ARDS protocol.  Will continue Abx.    Had lengthy d/w pt's wife and brother at bedside.  CC time by me independent of APP time, procedure time is 48 minutes.  Chesley Mires, MD [facility] Pulmonary/Critical Care 09/30/2015, 2:38 PM Pager:  [phone] After 3pm call: [phone]   D/w Dr. Justin Mend >> has good urine outpt >> Hold off on CRRT.  Hold off on HD cath placement for now.  Chesley Mires, MD [facility] Pulmonary/Critical Care 09/30/2015, 2:44 PM Pager:  [phone] After 3pm call: [phone]

## 2015-09-30 DEATH — deceased

## 2015-10-01 ENCOUNTER — Inpatient Hospital Stay (HOSPITAL_COMMUNITY): Payer: BLUE CROSS/BLUE SHIELD

## 2015-10-01 LAB — CARBOXYHEMOGLOBIN
CARBOXYHEMOGLOBIN: 1.5 % (ref 0.5–1.5)
Methemoglobin: 1 % (ref 0.0–1.5)
O2 SAT: 64 %
TOTAL HEMOGLOBIN: 10.3 g/dL — AB (ref 13.5–18.0)

## 2015-10-01 LAB — COMPREHENSIVE METABOLIC PANEL
ALBUMIN: 1.9 g/dL — AB (ref 3.5–5.0)
ALT: 110 U/L — ABNORMAL HIGH (ref 17–63)
ANION GAP: 21 — AB (ref 5–15)
AST: 68 U/L — ABNORMAL HIGH (ref 15–41)
Alkaline Phosphatase: 187 U/L — ABNORMAL HIGH (ref 38–126)
BILIRUBIN TOTAL: 6.4 mg/dL — AB (ref 0.3–1.2)
BUN: 168 mg/dL — ABNORMAL HIGH (ref 6–20)
CO2: 30 mmol/L (ref 22–32)
Calcium: 6.8 mg/dL — ABNORMAL LOW (ref 8.9–10.3)
Chloride: 90 mmol/L — ABNORMAL LOW (ref 101–111)
Creatinine, Ser: 7.11 mg/dL — ABNORMAL HIGH (ref 0.61–1.24)
GFR calc Af Amer: 9 mL/min — ABNORMAL LOW (ref >60)
GFR calc non Af Amer: 8 mL/min — ABNORMAL LOW (ref >60)
GLUCOSE: 139 mg/dL — AB (ref 65–99)
POTASSIUM: 4.8 mmol/L (ref 3.5–5.1)
SODIUM: 141 mmol/L (ref 135–145)
TOTAL PROTEIN: 5.6 g/dL — AB (ref 6.5–8.1)

## 2015-10-01 LAB — APTT: aPTT: 30 seconds (ref 24–37)

## 2015-10-01 LAB — CBC
HEMATOCRIT: 33.3 % — AB (ref 39.0–52.0)
HEMOGLOBIN: 10.4 g/dL — AB (ref 13.0–17.0)
MCH: 23.9 pg — ABNORMAL LOW (ref 26.0–34.0)
MCHC: 31.2 g/dL (ref 30.0–36.0)
MCV: 76.6 fL — ABNORMAL LOW (ref 78.0–100.0)
Platelets: 54 10*3/uL — ABNORMAL LOW (ref 150–400)
RBC: 4.35 MIL/uL (ref 4.22–5.81)
RDW: 18.2 % — ABNORMAL HIGH (ref 11.5–15.5)
WBC: 26.3 10*3/uL — ABNORMAL HIGH (ref 4.0–10.5)

## 2015-10-01 LAB — MAGNESIUM: MAGNESIUM: 2.7 mg/dL — AB (ref 1.7–2.4)

## 2015-10-01 LAB — PROTIME-INR
INR: 1.54 — AB (ref 0.00–1.49)
PROTHROMBIN TIME: 18.5 s — AB (ref 11.6–15.2)

## 2015-10-01 LAB — PHOSPHORUS: PHOSPHORUS: 10.6 mg/dL — AB (ref 2.5–4.6)

## 2015-10-01 LAB — PROCALCITONIN: Procalcitonin: 11.78 ng/mL

## 2015-10-01 MED ORDER — FUROSEMIDE 10 MG/ML IJ SOLN
40.0000 mg | Freq: Once | INTRAMUSCULAR | Status: AC
  Administered 2015-10-01: 40 mg via INTRAVENOUS
  Filled 2015-10-01: qty 4

## 2015-10-01 MED ORDER — IPRATROPIUM-ALBUTEROL 0.5-2.5 (3) MG/3ML IN SOLN
3.0000 mL | Freq: Four times a day (QID) | RESPIRATORY_TRACT | Status: DC
  Administered 2015-10-01 – 2015-10-02 (×4): 3 mL via RESPIRATORY_TRACT
  Filled 2015-10-01 (×4): qty 3

## 2015-10-01 NOTE — Progress Notes (Signed)
Advanced Heart Failure Team Rounding Note  Referring Physician: Dr Vaughan Browner Primary Physician: Primary Cardiologist:  None Oncologist: Dr Marin Olp  Reason for Consultation: Acute Systolic Heart Failure   HPI:    Mr Mollett is a 54 year old male with h/o tobacco and ETOH abuse who recently developed large cervical adenopathy and intractable back pain. Underwent PET scan earlier this month which showed hypermetabolic left cervical lymphadenopathy involving levels II-IV and presence of hepatomegaly with diffuse liver hypermetabolism suggesting probably lymphoma.   Admitted 9/26 with multisystem organ failure. Echo performed and EF 10-15% with moderate RV dysfunction. Initial co-ox drawn was 55%. Started on milrinone. Intubated and transferred to [facility]. Developed large GI bleed. EGDwith esophagitis.   Underwent core biopsy of cervical node 9/27. ->  Hodgkin's lymphoma  Adcetria given 9/30 for Hodgkins.  Reintubated. Remains on dobutamine and levophed. Co-ox now ok at 64%. CVP 15. Creatinine continues to worsen but still with good output with lasix which was stopped today by Renal.    Objective:    Vital Signs:   Temp:  [98.2 F (36.8 C)-99.9 F (37.7 C)] 99.4 F (37.4 C) (10/02 1400) Pulse Rate:  [75-124] 75 (10/02 1300) Resp:  [18-26] 22 (10/02 1400) BP: (84-104)/(46-74) 101/72 mmHg (10/02 1400) SpO2:  [89 %-100 %] 89 % (10/02 1300) FiO2 (%):  [50 %-60 %] 50 % (10/02 1400) Weight:  [87.1 kg (192 lb 0.3 oz)] 87.1 kg (192 lb 0.3 oz) (10/02 0500)    Weight change: Filed Weights   09/29/15 0500 09/30/15 0500 10/01/15 0500  Weight: 85.1 kg (187 lb 9.8 oz) 85.1 kg (187 lb 9.8 oz) 87.1 kg (192 lb 0.3 oz)    Intake/Output:   Intake/Output Summary (Last 24 hours) at 10/01/15 1501 Last data filed at 10/01/15 1400  Gross per 24 hour  Intake 1765.86 ml  Output   1650 ml  Net 115.86 ml     Physical Exam: General:  Intubated/sedated HEENT: normal  Neck: supple. RIJ TLC .  Prominent left neck lymphadenopathy Cor: PMI laterally displaced. regular + s3 Lungs: clear anteriorly   Abdomen: soft, nontender, mildly distended. No bruits or masses. Good bowel sounds. Extremities: no cyanosis, clubbing, rash, no edema Neuro: sedated  Telemetry: sinus 70-80s  Labs: Basic Metabolic Panel:  Recent Labs Lab 09/27/15 0152 09/27/15 0522 09/28/15 0319 09/28/15 0347 09/29/15 0511 09/30/15 0436 10/01/15 0424  NA 140 139  --  143 143 142 141  K 3.6 3.9  --  3.3* 3.7 4.6 4.8  CL 96* 95*  --  94* 92* 90* 90*  CO2 27 28  --  31 33* 31 30  GLUCOSE 174* 161*  --  164* 160* 152* 139*  BUN 120* 118*  --  124* 128* 156* 168*  CREATININE 4.94* 4.95*  --  5.34* 5.87* 6.29* 7.11*  CALCIUM 5.9* 5.8*  --  6.2* 6.6* 6.7* 6.8*  MG 2.5* 2.5* 2.4  --  2.4  --  2.7*  PHOS 6.2* 6.3*  --  6.2* 6.4* 10.1* 10.6*    Liver Function Tests:  Recent Labs Lab 09/27/15 0522 09/28/15 0319 09/28/15 0347 09/29/15 0511 09/30/15 0436 10/01/15 0424  AST 251* 151*  --  104* 77* 68*  ALT 226* 190*  --  160* 140* 110*  ALKPHOS 169* 160*  --  168* 193* 187*  BILITOT 7.1* 6.4*  --  5.9* 6.0* 6.4*  PROT 5.4* 5.8*  --  5.9* 5.6* 5.6*  ALBUMIN 2.0*  2.0* 2.0* 1.9* 2.0*  2.0* 1.9* 1.9*   No results for input(s): LIPASE, AMYLASE in the last 168 hours.  Recent Labs Lab 09/25/15 1523  AMMONIA 37*    CBC:  Recent Labs Lab 09/25/15 1524 09/18/2015 0539 09/27/15 0522 09/28/15 0319 09/29/15 0511 10/01/15 0424  WBC 15.0* 14.2* 23.0* 25.3* 27.7* 26.3*  NEUTROABS 13.7* 13.3* 21.9* 24.0* 27.1*  --   HGB 11.0* 9.9* 9.7* 9.6* 10.0* 10.4*  HCT 34.9* 31.0* 30.3* 30.0* 31.0* 33.3*  MCV 78.3 75.4* 74.6* 73.9* 74.5* 76.6*  PLT 327 258 192 125* 74* 54*    Cardiac Enzymes:  Recent Labs Lab 09/06/2015 1630 09/25/15 0400 09/25/15 0800 09/25/15 1524 09/08/2015 0539 09/27/15 0152  CKTOTAL 1533* 1111* 1052* 938* 674*  --   CKMB  --   --  73.5*  --   --   --   TROPONINI  --   --   --   --    --  0.09*    BNP: BNP (last 3 results) No results for input(s): BNP in the last 8760 hours.  ProBNP (last 3 results) No results for input(s): PROBNP in the last 8760 hours.   CBG:  Recent Labs Lab 09/28/15 1937 09/28/15 2354 09/29/15 0351 09/29/15 0814 09/29/15 1141  GLUCAP 146* 164* 162* 164* 167*    Coagulation Studies:  Recent Labs  09/29/15 0511 09/30/15 0436 10/01/15 0424  LABPROT 19.5* 19.7* 18.5*  INR 1.65* 1.67* 1.54*    Other results: EKG: ST 107 RBBB/LAFB  Imaging: Dg Chest Port 1 View  10/01/2015   CLINICAL DATA:  Hypoxia/respiratory failure  EXAM: PORTABLE CHEST 1 VIEW  COMPARISON:  September 30, 2015  FINDINGS: Endotracheal tube tip is 6.1 cm above the carina. Central catheter tip is in the superior vena cava slightly proximal to the cavoatrial junction. No appreciable pneumothorax. There is patchy airspace disease throughout both lungs bilaterally, stable. There is mild underlying interstitial edema. No new opacity. Heart is enlarged with pulmonary venous hypertension. No adenopathy appreciable.  IMPRESSION: No appreciable change from 1 day prior. Extensive although patchy airspace disease bilaterally. Cardiomegaly with pulmonary venous hypertension and mild edema. Suspect a degree of congestive heart failure. There may well be superimposed pneumonia and/ or ARDS. More than one of these entities may exist concurrently. Tube and catheter positions as described without pneumothorax.   Electronically Signed   By: Lowella Grip III M.D.   On: 10/01/2015 07:05   Dg Chest Port 1 View  09/30/2015   CLINICAL DATA:  Hypoxemia  EXAM: PORTABLE CHEST 1 VIEW  COMPARISON:  Radiograph 09/25/2015  FINDINGS: No central venous line unchanged. Stable cardiac silhouette. There is interval increase in bilateral diffuse airspace disease. Small bilateral pleural effusions.  IMPRESSION: Worsening diffuse bilateral airspace disease consistent with worsening pulmonary edema versus less  likely diffuse pulmonary infection.  These results will be called to the ordering clinician or representative by the Radiologist Assistant, and communication documented in the PACS or zVision Dashboard.   Electronically Signed   By: Suzy Bouchard M.D.   On: 09/30/2015 08:16   Dg Chest Port 1v Same Day  09/30/2015   CLINICAL DATA:  Multi-system organ failure. Respiratory failure requiring intubation.  EXAM: PORTABLE CHEST 1 VIEW  COMPARISON:  Chest radiograph from earlier today.  FINDINGS: Endotracheal tube tip is 3.5 cm above the carina. Right internal jugular central venous catheter terminates at the cavoatrial junction. Stable cardiomediastinal silhouette with mild cardiomegaly. No pneumothorax. No pleural effusion. There is severe patchy airspace opacity throughout both lungs,  not appreciably changed.  IMPRESSION: 1. Well-positioned endotracheal tube and right internal jugular central venous catheter. No pneumothorax. 2. Stable mild cardiomegaly. No appreciable change in severe patchy airspace opacity throughout both lungs, differential includes severe pulmonary edema, ARDS and/or multifocal pneumonia.   Electronically Signed   By: Ilona Sorrel M.D.   On: 09/30/2015 14:28     Medications:     Current Medications: . acyclovir  400 mg Per Tube Daily  . allopurinol  100 mg Per Tube Daily  . antiseptic oral rinse  7 mL Mouth Rinse Q4H  . chlorhexidine gluconate  15 mL Mouth Rinse BID  . feeding supplement (VITAL HIGH PROTEIN)  1,000 mL Per Tube Q24H  . ipratropium-albuterol  3 mL Nebulization Q6H  . nicotine  7 mg Transdermal Daily  . pantoprazole (PROTONIX) IV  40 mg Intravenous Q12H  . piperacillin-tazobactam (ZOSYN)  IV  2.25 g Intravenous Q8H    Infusions: . sodium chloride 10 mL/hr at 09/29/15 1900  . DOBUTamine 5 mcg/kg/min (09/30/15 0616)  . norepinephrine (LEVOPHED) Adult infusion 10 mcg/min (10/01/15 1353)  . propofol (DIPRIVAN) infusion 30 mcg/kg/min (10/01/15 0415)      Assessment:   1. Cardiogenic shock 2. Acute systolic HF EF 81-85%, etiology uncertain.  Brother has dilated cardiomyopathy so possibly familial.  Reports drinking 6 beers/day long-term.  3. Acute respiratory failure 4. Multisystem organ failure 5. Acute kidney injury 6. Shock liver 7. Hyperkalemia 8. Cervical lymphadenopathy - path appears to be Hodgkins lymphoma. Adcetria given 9/30    --s/p core biopsy 9/27 9. Tobacco use 10. ETOH use - 4+ beers per day 11. Family h/o NICM in brother 28. Massive upper GI bleed     --severe esophagitis by EGD on 9/27 13. Coagulopathy - due to shock liver with ? DIC 14. ?CAP  Plan/Discussion:    Cardiac output improved on dobutamine and levophed, but overall seems to be deteriorating. CVP markedly elevated and CXR with diffuse infiltrates (suspect ARDS or lymphproliferative process).   Will give another dose IV lasix this afternoon. Otherwise not much else to offer here.    Glori Bickers MD 10/01/2015 3:01 PM Advanced Heart Failure Team Pager [phone] (M-F; [place])  Please contact [facility] for night-coverage after hours (4p -7a ) and weekends on [email or web address]

## 2015-10-01 NOTE — Progress Notes (Signed)
[facility] ROUNDING NOTE   Subjective:   Interval History:  reintubated  Still on pressors  Objective:  Vital signs in last 24 hours:  Temp:  [97.8 F (36.6 C)-99.9 F (37.7 C)] 99.9 F (37.7 C) (10/02 0600) Pulse Rate:  [98-124] 120 (10/02 0832) Resp:  [17-32] 25 (10/02 0832) BP: (84-103)/(46-74) 95/66 mmHg (10/02 0600) SpO2:  [92 %-100 %] 100 % (10/02 0832) FiO2 (%):  [50 %-60 %] 50 % (10/02 0832) Weight:  [87.1 kg (192 lb 0.3 oz)] 87.1 kg (192 lb 0.3 oz) (10/02 0500)  Weight change: 2 kg (4 lb 6.6 oz) Filed Weights   09/29/15 0500 09/30/15 0500 10/01/15 0500  Weight: 85.1 kg (187 lb 9.8 oz) 85.1 kg (187 lb 9.8 oz) 87.1 kg (192 lb 0.3 oz)    Intake/Output: I/O last 3 completed shifts: In: 0737 [I.V.:1282; IV Piggyback:432] Out: 1062 [Urine:1750]   Intake/Output this shift:     General: Awake and alert, On BIPAP  Integument: Warm & dry.  HEENT: Dressing over neck on left postbiopsy is clean and dry.  Cardiovascular: Regular rhythm Pulmonary: Coarse breath sounds Abdomen: Soft. Normal bowel sounds. Nondistended. Nontender.  Neurological: Following commands   Basic Metabolic Panel:  Recent Labs Lab 09/27/15 0152 09/27/15 0522 09/28/15 0319 09/28/15 0347 09/29/15 0511 09/30/15 0436 10/01/15 0424  NA 140 139  --  143 143 142 141  K 3.6 3.9  --  3.3* 3.7 4.6 4.8  CL 96* 95*  --  94* 92* 90* 90*  CO2 27 28  --  31 33* 31 30  GLUCOSE 174* 161*  --  164* 160* 152* 139*  BUN 120* 118*  --  124* 128* 156* 168*  CREATININE 4.94* 4.95*  --  5.34* 5.87* 6.29* 7.11*  CALCIUM 5.9* 5.8*  --  6.2* 6.6* 6.7* 6.8*  MG 2.5* 2.5* 2.4  --  2.4  --  2.7*  PHOS 6.2* 6.3*  --  6.2* 6.4* 10.1* 10.6*    Liver Function Tests:  Recent Labs Lab 09/27/15 0522 09/28/15 0319 09/28/15 0347 09/29/15 0511 09/30/15 0436 10/01/15 0424  AST 251* 151*  --  104* 77* 68*  ALT 226* 190*  --  160* 140* 110*  ALKPHOS 169* 160*  --  168* 193* 187*  BILITOT  7.1* 6.4*  --  5.9* 6.0* 6.4*  PROT 5.4* 5.8*  --  5.9* 5.6* 5.6*  ALBUMIN 2.0*  2.0* 2.0* 1.9* 2.0*  2.0* 1.9* 1.9*   No results for input(s): LIPASE, AMYLASE in the last 168 hours.  Recent Labs Lab 09/25/15 1523  AMMONIA 37*    CBC:  Recent Labs Lab 09/25/15 1524 09/01/2015 0539 09/27/15 0522 09/28/15 0319 09/29/15 0511 10/01/15 0424  WBC 15.0* 14.2* 23.0* 25.3* 27.7* 26.3*  NEUTROABS 13.7* 13.3* 21.9* 24.0* 27.1*  --   HGB 11.0* 9.9* 9.7* 9.6* 10.0* 10.4*  HCT 34.9* 31.0* 30.3* 30.0* 31.0* 33.3*  MCV 78.3 75.4* 74.6* 73.9* 74.5* 76.6*  PLT 327 258 192 125* 74* 54*    Cardiac Enzymes:  Recent Labs Lab 09/27/2015 1630 09/25/15 0400 09/25/15 0800 09/25/15 1524 09/09/2015 0539 09/27/15 0152  CKTOTAL 1533* 1111* 1052* 938* 674*  --   CKMB  --   --  73.5*  --   --   --   TROPONINI  --   --   --   --   --  0.09*    BNP: Invalid input(s): POCBNP  CBG:  Recent Labs Lab 09/28/15 1937 09/28/15 2354  09/29/15 0351 09/29/15 0814 09/29/15 1141  GLUCAP 146* 164* 162* 164* 167*    Microbiology: Results for orders placed or performed during the hospital encounter of 09/15/2015  Blood Culture (routine x 2)     Status: None   Collection Time: 09/01/2015  6:20 PM  Result Value Ref Range Status   Specimen Description BLOOD CENTRAL LINE  Final   Special Requests BOTTLES DRAWN AEROBIC AND ANAEROBIC [place] EA  Final   Culture   Final    NO GROWTH 5 DAYS Performed at [facility]    Report Status 09/30/2015 FINAL  Final  Urine culture     Status: None   Collection Time: 09/27/2015  8:06 PM  Result Value Ref Range Status   Specimen Description URINE, RANDOM  Final   Special Requests NONE  Final   Culture   Final    NO GROWTH 1 DAY Performed at [facility]    Report Status 09/10/2015 FINAL  Final  MRSA PCR Screening     Status: None   Collection Time: 09/04/2015  9:23 PM  Result Value Ref Range Status   MRSA by PCR NEGATIVE NEGATIVE Final    Comment:         The GeneXpert MRSA Assay (FDA approved for NASAL specimens only), is one component of a comprehensive MRSA colonization surveillance program. It is not intended to diagnose MRSA infection nor to guide or monitor treatment for MRSA infections.   Blood Culture (routine x 2)     Status: None   Collection Time: 09/23/2015 10:37 PM  Result Value Ref Range Status   Specimen Description BLOOD RIGHT ARM  Final   Special Requests BOTTLES DRAWN AEROBIC AND ANAEROBIC 3CC  Final   Culture   Final    NO GROWTH 5 DAYS Performed at [facility]    Report Status 09/30/2015 FINAL  Final  Culture, respiratory (NON-Expectorated)     Status: None   Collection Time: 09/27/15  3:04 AM  Result Value Ref Range Status   Specimen Description TRACHEAL ASPIRATE  Final   Special Requests Immunocompromised  Final   Gram Stain   Final    FEW WBC PRESENT,BOTH PMN AND MONONUCLEAR NO SQUAMOUS EPITHELIAL CELLS SEEN ABUNDANT YEAST Performed at [facility]    Culture   Final    ABUNDANT YEAST CONSISTENT WITH CANDIDA SPECIES Performed at [facility]    Report Status 09/29/2015 FINAL  Final    Coagulation Studies:  Recent Labs  09/29/15 0511 09/30/15 0436 10/01/15 0424  LABPROT 19.5* 19.7* 18.5*  INR 1.65* 1.67* 1.54*    Urinalysis: No results for input(s): COLORURINE, LABSPEC, PHURINE, GLUCOSEU, HGBUR, BILIRUBINUR, KETONESUR, PROTEINUR, UROBILINOGEN, NITRITE, LEUKOCYTESUR in the last 72 hours.  Invalid input(s): APPERANCEUR    Imaging: Dg Chest Port 1 View  10/01/2015   CLINICAL DATA:  Hypoxia/respiratory failure  EXAM: PORTABLE CHEST 1 VIEW  COMPARISON:  September 30, 2015  FINDINGS: Endotracheal tube tip is 6.1 cm above the carina. Central catheter tip is in the superior vena cava slightly proximal to the cavoatrial junction. No appreciable pneumothorax. There is patchy airspace disease throughout both lungs bilaterally, stable. There is mild underlying interstitial edema.  No new opacity. Heart is enlarged with pulmonary venous hypertension. No adenopathy appreciable.  IMPRESSION: No appreciable change from 1 day prior. Extensive although patchy airspace disease bilaterally. Cardiomegaly with pulmonary venous hypertension and mild edema. Suspect a degree of congestive heart failure. There may well be superimposed pneumonia and/  or ARDS. More than one of these entities may exist concurrently. Tube and catheter positions as described without pneumothorax.   Electronically Signed   By: Lowella Grip III M.D.   On: 10/01/2015 07:05   Dg Chest Port 1 View  09/30/2015   CLINICAL DATA:  Hypoxemia  EXAM: PORTABLE CHEST 1 VIEW  COMPARISON:  Radiograph 09/25/2015  FINDINGS: No central venous line unchanged. Stable cardiac silhouette. There is interval increase in bilateral diffuse airspace disease. Small bilateral pleural effusions.  IMPRESSION: Worsening diffuse bilateral airspace disease consistent with worsening pulmonary edema versus less likely diffuse pulmonary infection.  These results will be called to the ordering clinician or representative by the Radiologist Assistant, and communication documented in the PACS or zVision Dashboard.   Electronically Signed   By: Suzy Bouchard M.D.   On: 09/30/2015 08:16   Dg Chest Port 1v Same Day  09/30/2015   CLINICAL DATA:  Multi-system organ failure. Respiratory failure requiring intubation.  EXAM: PORTABLE CHEST 1 VIEW  COMPARISON:  Chest radiograph from earlier today.  FINDINGS: Endotracheal tube tip is 3.5 cm above the carina. Right internal jugular central venous catheter terminates at the cavoatrial junction. Stable cardiomediastinal silhouette with mild cardiomegaly. No pneumothorax. No pleural effusion. There is severe patchy airspace opacity throughout both lungs, not appreciably changed.  IMPRESSION: 1. Well-positioned endotracheal tube and right internal jugular central venous catheter. No pneumothorax. 2. Stable mild  cardiomegaly. No appreciable change in severe patchy airspace opacity throughout both lungs, differential includes severe pulmonary edema, ARDS and/or multifocal pneumonia.   Electronically Signed   By: Ilona Sorrel M.D.   On: 09/30/2015 14:28     Medications:   . sodium chloride 10 mL/hr at 09/29/15 1900  . DOBUTamine 5 mcg/kg/min (09/30/15 0616)  . norepinephrine (LEVOPHED) Adult infusion 10 mcg/min (10/01/15 0603)  . propofol (DIPRIVAN) infusion 30 mcg/kg/min (10/01/15 0415)   . acyclovir  400 mg Per Tube Daily  . allopurinol  100 mg Per Tube Daily  . antiseptic oral rinse  7 mL Mouth Rinse Q4H  . chlorhexidine gluconate  15 mL Mouth Rinse BID  . feeding supplement (VITAL HIGH PROTEIN)  1,000 mL Per Tube Q24H  . furosemide  160 mg Intravenous 3 times per day  . nicotine  7 mg Transdermal Daily  . pantoprazole (PROTONIX) IV  40 mg Intravenous Q12H  . piperacillin-tazobactam (ZOSYN)  IV  2.25 g Intravenous Q8H   sodium chloride, albuterol, albuterol, alteplase, diphenhydrAMINE, diphenhydrAMINE, EPINEPHrine, EPINEPHrine, EPINEPHrine, EPINEPHrine, famotidine, fentaNYL (SUBLIMAZE) injection, fentaNYL (SUBLIMAZE) injection, heparin lock flush, heparin lock flush, methylPREDNISolone sodium succinate, ondansetron (ZOFRAN) IV, sodium chloride, sodium chloride  Assessment/ Plan:  1. AKI: Multifactorial in setting of volume depletion, NSAID use, likely rhabdomyolysis (CK 1533 on admission). Cr up slightly. UOP remains excellent.No emergent dialysis needs at this time.  Will stop lasix 2. Metabolic Acidosis/Anion Gap Metabolic Acidosis: Bicarb stable 3. Hypokalemia: Previously hyperkalemic now 4.6 4. Hypocalcemia: Continues to be hypocalcemic. Likely multifactorial in setting of shock, malignancy (tumor lysis syndrome? Calcium stable corrects up to normal range 5. Hyperphosphatemia: Ph stable at 6.2-6.3 range in setting of AKI, recent rhabdo. Continue to monitor.  6. Neck mass, likely Hodgkin's:  Oncology following. Bedside FNA of left cervical lymph node 9/27. Preliminary read with likely Hodgkin's disease.  7. 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 On milrinone and levophed drips. BPs in 13K-440N systolic. Possible CAP, on Zosyn currently. Blood and urine cultures negative so far. Respiratory culture with abundant  yeast. Management per HF team and PCCM.  8. UGIB: Secondary to erosive esophagitis. EGD performed on 9/27. GI signed off today. Currently on PPI drip, but can be switched to IV PPI today. Hgb stable at  10.4 9. Acute Bleeding in Setting of Iron Deficiency Anemia: Anemia panel on 9/13 shows low iron 12 and low saturation 5%. Ferritin elevated at 350 but also acute phase reactant in setting of likely malignancy. Baseline Hgb in 9-10 range as outpatient. Transfuse for Hgb < 7.0. Hgb stable    LOS: 7 Zyen Triggs W @TODAY @9 :13 AM

## 2015-10-01 NOTE — Progress Notes (Signed)
Paul Morton   DOB:1961/09/24   PQ#:[id number]   JFH#:[id number]  Subjective:  On vent, re-intubated yesterday. Patient had previously self extubated.Comfortable. Family present. Nursing notes no difficulties.  Continues on dobutamine and levophed.  Objective:  Filed Vitals:   10/01/15 0832  BP:   Pulse: 120  Temp:   Resp: 25    Body mass index is 26.41 kg/(m^2).  Intake/Output Summary (Last 24 hours) at 10/01/15 1126 Last data filed at 10/01/15 0603  Gross per 24 hour  Intake 1305.4 ml  Output   1500 ml  Net -194.6 ml   General: Intubated HEENT: benign, improvement in L supraclavicular adenopathy Pulm: Coarse BS bilaterally Cardio: Tachycardic, S1/S1 audible Abdomen: Soft without tenderness, no HSM Extremities: TEDS/SCDs, no edema Neurologic: Sedated  CBG (last 3)   Recent Labs  09/29/15 0351 09/29/15 0814 09/29/15 1141  GLUCAP 162* 164* 167*   Labs:  Lab Results  Component Value Date   WBC 26.3* 10/01/2015   HGB 10.4* 10/01/2015   HCT 33.3* 10/01/2015   MCV 76.6* 10/01/2015   PLT 54* 10/01/2015   NEUTROABS 27.1* 93/73/4287    Basic Metabolic Panel:  Recent Labs Lab 09/27/15 0152 09/27/15 0522 09/28/15 0319 09/28/15 0347 09/29/15 0511 09/30/15 0436 10/01/15 0424  NA 140 139  --  143 143 142 141  K 3.6 3.9  --  3.3* 3.7 4.6 4.8  CL 96* 95*  --  94* 92* 90* 90*  CO2 27 28  --  31 33* 31 30  GLUCOSE 174* 161*  --  164* 160* 152* 139*  BUN 120* 118*  --  124* 128* 156* 168*  CREATININE 4.94* 4.95*  --  5.34* 5.87* 6.29* 7.11*  CALCIUM 5.9* 5.8*  --  6.2* 6.6* 6.7* 6.8*  MG 2.5* 2.5* 2.4  --  2.4  --  2.7*  PHOS 6.2* 6.3*  --  6.2* 6.4* 10.1* 10.6*   GFR Estimated Creatinine Clearance: 12.7 mL/min (by C-G formula based on Cr of 7.11). Liver Function Tests:  Recent Labs Lab 09/27/15 0522 09/28/15 0319 09/28/15 0347 09/29/15 0511 09/30/15 0436 10/01/15 0424  AST 251* 151*  --  104* 77* 68*  ALT 226* 190*  --  160* 140* 110*  ALKPHOS 169*  160*  --  168* 193* 187*  BILITOT 7.1* 6.4*  --  5.9* 6.0* 6.4*  PROT 5.4* 5.8*  --  5.9* 5.6* 5.6*  ALBUMIN 2.0*  2.0* 2.0* 1.9* 2.0*  2.0* 1.9* 1.9*   Coagulation profile  Recent Labs Lab 09/27/15 0522 09/28/15 0319 09/29/15 0511 09/30/15 0436 10/01/15 0424  INR 1.73* 1.72* 1.65* 1.67* 1.54*    CBC:  Recent Labs Lab 09/25/15 1524 09/25/2015 0539 09/27/15 0522 09/28/15 0319 09/29/15 0511 10/01/15 0424  WBC 15.0* 14.2* 23.0* 25.3* 27.7* 26.3*  NEUTROABS 13.7* 13.3* 21.9* 24.0* 27.1*  --   HGB 11.0* 9.9* 9.7* 9.6* 10.0* 10.4*  HCT 34.9* 31.0* 30.3* 30.0* 31.0* 33.3*  MCV 78.3 75.4* 74.6* 73.9* 74.5* 76.6*  PLT 327 258 192 125* 74* 54*   Cardiac Enzymes:  Recent Labs Lab 09/28/2015 1630 09/25/15 0400 09/25/15 0800 09/25/15 1524 08/31/2015 0539 09/27/15 0152  CKTOTAL 1533* 1111* 1052* 938* 674*  --   CKMB  --   --  73.5*  --   --   --   TROPONINI  --   --   --   --   --  0.09*   CBG:  Recent Labs Lab 09/28/15 1937 09/28/15 2354 09/29/15 0351 09/29/15  4680 09/29/15 1141  [place]  09/30/15 1641  TRIG 316*   Microbiology Recent Results (from the past 240 hour(s))  Blood Culture (routine x 2)     Status: None   Collection Time: 09/06/2015  6:20 PM  Result Value Ref Range Status   Specimen Description BLOOD CENTRAL LINE  Final   Special Requests BOTTLES DRAWN AEROBIC AND ANAEROBIC [place] EA  Final   Culture   Final    NO GROWTH 5 DAYS Performed at [facility]    Report Status 09/30/2015 FINAL  Final  Urine culture     Status: None   Collection Time: 09/20/2015  8:06 PM  Result Value Ref Range Status   Specimen Description URINE, RANDOM  Final   Special Requests NONE  Final   Culture   Final    NO GROWTH 1 DAY Performed at [facility]    Report Status 09/14/2015 FINAL  Final  MRSA PCR Screening     Status: None   Collection Time: 09/11/2015  9:23 PM  Result Value Ref Range Status   MRSA by PCR  NEGATIVE NEGATIVE Final    Comment:        The GeneXpert MRSA Assay (FDA approved for NASAL specimens only), is one component of a comprehensive MRSA colonization surveillance program. It is not intended to diagnose MRSA infection nor to guide or monitor treatment for MRSA infections.   Blood Culture (routine x 2)     Status: None   Collection Time: 09/16/2015 10:37 PM  Result Value Ref Range Status   Specimen Description BLOOD RIGHT ARM  Final   Special Requests BOTTLES DRAWN AEROBIC AND ANAEROBIC 3CC  Final   Culture   Final    NO GROWTH 5 DAYS Performed at [facility]    Report Status 09/30/2015 FINAL  Final  Culture, respiratory (NON-Expectorated)     Status: None   Collection Time: 09/27/15  3:04 AM  Result Value Ref Range Status   Specimen Description TRACHEAL ASPIRATE  Final   Special Requests Immunocompromised  Final   Gram Stain   Final    FEW WBC PRESENT,BOTH PMN AND MONONUCLEAR NO SQUAMOUS EPITHELIAL CELLS SEEN ABUNDANT YEAST Performed at [facility]    Culture   Final    ABUNDANT YEAST CONSISTENT WITH CANDIDA SPECIES Performed at [facility]    Report Status 09/29/2015 FINAL  Final      Studies:  Dg Chest Port 1 View  10/01/2015   CLINICAL DATA:  Hypoxia/respiratory failure  EXAM: PORTABLE CHEST 1 VIEW  COMPARISON:  September 30, 2015  FINDINGS: Endotracheal tube tip is 6.1 cm above the carina. Central catheter tip is in the superior vena cava slightly proximal to the cavoatrial junction. No appreciable pneumothorax. There is patchy airspace disease throughout both lungs bilaterally, stable. There is mild underlying interstitial edema. No new opacity. Heart is enlarged with pulmonary venous hypertension. No adenopathy appreciable.  IMPRESSION: No appreciable change from 1 day prior. Extensive although patchy airspace disease bilaterally. Cardiomegaly with pulmonary venous hypertension and mild edema. Suspect a degree of congestive heart  failure. There may well be superimposed pneumonia and/ or ARDS. More than one of these entities may exist concurrently. Tube and catheter positions as described without pneumothorax.   Electronically Signed   By: Lowella Grip III M.D.   On: 10/01/2015 07:05   Dg Chest Port 1 View  09/30/2015   CLINICAL  DATA:  Hypoxemia  EXAM: PORTABLE CHEST 1 VIEW  COMPARISON:  Radiograph 09/25/2015  FINDINGS: No central venous line unchanged. Stable cardiac silhouette. There is interval increase in bilateral diffuse airspace disease. Small bilateral pleural effusions.  IMPRESSION: Worsening diffuse bilateral airspace disease consistent with worsening pulmonary edema versus less likely diffuse pulmonary infection.  These results will be called to the ordering clinician or representative by the Radiologist Assistant, and communication documented in the PACS or zVision Dashboard.   Electronically Signed   By: Suzy Bouchard M.D.   On: 09/30/2015 08:16   Dg Chest Port 1v Same Day  09/30/2015   CLINICAL DATA:  Multi-system organ failure. Respiratory failure requiring intubation.  EXAM: PORTABLE CHEST 1 VIEW  COMPARISON:  Chest radiograph from earlier today.  FINDINGS: Endotracheal tube tip is 3.5 cm above the carina. Right internal jugular central venous catheter terminates at the cavoatrial junction. Stable cardiomediastinal silhouette with mild cardiomegaly. No pneumothorax. No pleural effusion. There is severe patchy airspace opacity throughout both lungs, not appreciably changed.  IMPRESSION: 1. Well-positioned endotracheal tube and right internal jugular central venous catheter. No pneumothorax. 2. Stable mild cardiomegaly. No appreciable change in severe patchy airspace opacity throughout both lungs, differential includes severe pulmonary edema, ARDS and/or multifocal pneumonia.   Electronically Signed   By: Ilona Sorrel M.D.   On: 09/30/2015 14:28    Assessment: 54 y.o. male with:  Hodgkin Lymphoma PET/CT on  9/22 with bulky cervical LAD, hepatomegaly with diffuse liver hypermetabolism H/O alcohol and tobacco abuse ARF with good UOP EF 10-15%, cardiomyopathy with severely dilated RV Coagulopathy s/p vitamin K and 4U FFP UGIB Thrombocytopenia   Patient given Brentuximab on Friday. Phosphorus and K are rising and hard to say if just secondary to kidney dysfunction or potential tumor lysis. Uric acid WNL on allopurinol. Calcium is low but corrects into normal range Continue to monitor calcium, phos and K. Tumor lysis is certainly a possibility with Brentuximab. Nephrology involved and following.  Will monitor closely for toxicity, particularly counts. Supportive care.Keep platelets >/= 20 K or give for bleeding.  Patients family had questions regarding PET/CT discussed with them.  Please call me with concerns/problems at [phone] I am on call through Monday am.  Molli Hazard, MD 10/01/2015  11:26 AM

## 2015-10-01 NOTE — Progress Notes (Signed)
PULMONARY / CRITICAL CARE MEDICINE   Name: Paul Morton MRN: [id number] DOB: [birth year]    ADMISSION DATE:  09/26/2015  REFERRING MD :  EDP  CHIEF COMPLAINT:  Sepsis   INITIAL PRESENTATION: 54yo male smoker with recent diagnosis lymphoma (still in early stages of workup) who presented 9/25 with progressive back pain.  In ER found to be tachycardic, hypotensive with lactate 14, AKI and coagulopathy.  PCCM called to admit.    STUDIES:  PET 9/22 - Bulky confluent hypermetabolic left cervical lymphadenopathy involving levels II-V. Given the presence of hepatomegaly with diffuse liver hypermetabolism, a diagnosis of lymphoma is favored, however metastatic carcinoma remains on the differential.  Hypermetabolic patchy ground-glass opacity and consolidation in both upper lobes and right lower lobe, favor an infectious or inflammatory etiology.  TTE 9/26 - EF 10-15% w/o regional wall motion abnormality. Grade 3 diastolic dysfunction. Moderate AR. Moderate MR. RV severely dilated w/ moderate reduction in systolic function. PASP 43 mmHg. No pericardial effusion.  RLE Venous Duplex 9/26 - no DVT or SVT  MRI spine 9/25 - incomplete exam, No evidence of significant spinal stenosis or cord compression in the cervical or visualized thoracic spine.  MRI L-spine 9/27 - suboptimal exam. No malignant or suspicious signal.  SIGNIFICANT EVENTS: 9/25 - Admitted to hospital 9/25 - Right IJ CVC by ED 9/26 - Intubated for pending respiratory failure & transferred to [facility] 9/27 - EGD w/ erosive esophagitis 9/27 - FNA neck mass 9/30 - Self extubation during SBT 10/1 -Reintubated   SUBJECTIVE:  Resting on vent, follows commands on sedation  CoOx improved today on Dobutamine and Levo  Scr tend up on lasix . UOP remains high with neg bal .  Renal holding on CRRT. recs to hold lasix    VITAL SIGNS: Temp:  [97.8 F (36.6 C)-99.9 F (37.7 C)] 99.9 F (37.7 C) (10/02 0600) Pulse Rate:  [98-124] 120 (10/02  0832) Resp:  [17-32] 25 (10/02 0832) BP: (84-103)/(46-74) 95/66 mmHg (10/02 0600) SpO2:  [92 %-100 %] 100 % (10/02 0832) FiO2 (%):  [50 %-60 %] 50 % (10/02 0832) Weight:  [87.1 kg (192 lb 0.3 oz)] 87.1 kg (192 lb 0.3 oz) (10/02 0500) HEMODYNAMICS: CVP:  [15 mmHg-16 mmHg] 15 mmHg VENTILATOR SETTINGS: Vent Mode:  [-] PRVC FiO2 (%):  [50 %-60 %] 50 % Set Rate:  [22 bmp-32 bmp] 22 bmp Vt Set:  [470 mL-610 mL] 470 mL PEEP:  [10 cmH20] 10 cmH20 Plateau Pressure:  [13 cmH20-33 cmH20] 13 cmH20 INTAKE / OUTPUT:  Intake/Output Summary (Last 24 hours) at 10/01/15 1007 Last data filed at 10/01/15 0603  Gross per 24 hour  Intake 1365.4 ml  Output   1500 ml  Net -134.6 ml    PHYSICAL EXAMINATION: General:  Sedated on vent  Integument:  Warm /dry  HEENT:  ETT , dry mucosa .   Cardiovascular:  Regular rhythm. Tachycardic. No edema.  Pulmonary: Coarse breath sounds bilaterally  Abdomen: Soft. Normal bowel sounds. Nondistended. Nontender.  Neurological: Following commands on sedation   LABS:  CBC  Recent Labs Lab 09/28/15 0319 09/29/15 0511 10/01/15 0424  WBC 25.3* 27.7* 26.3*  HGB 9.6* 10.0* 10.4*  HCT 30.0* 31.0* 33.3*  PLT 125* 74* 54*   Coag's  Recent Labs Lab 09/29/15 0511 09/30/15 0436 10/01/15 0424  APTT 31 31 30   INR 1.65* 1.67* 1.54*   BMET  Recent Labs Lab 09/29/15 0511 09/30/15 0436 10/01/15 0424  NA 143 142 141  K 3.7 4.6 4.8  CL 92* 90* 90*  CO2 33* 31 30  BUN 128* 156* 168*  CREATININE 5.87* 6.29* 7.11*  GLUCOSE 160* 152* 139*   Electrolytes  Recent Labs Lab 09/28/15 0319  09/29/15 0511 09/30/15 0436 10/01/15 0424  CALCIUM  --   < > 6.6* 6.7* 6.8*  MG 2.4  --  2.4  --  2.7*  PHOS  --   < > 6.4* 10.1* 10.6*  < > = values in this interval not displayed. Sepsis Markers  Recent Labs Lab 09/29/2015 2237  09/25/15 0800 09/19/2015 0541 09/27/15 0524 09/30/15 1519 10/01/15 0424  LATICACIDVEN 8.6*  < > 8.4* 2.2* 1.4  --   --   PROCALCITON  5.88  --   --   --   --  11.80 11.78  < > = values in this interval not displayed. ABG  Recent Labs Lab 09/30/15 0333 09/30/15 1445 09/30/15 1620  PHART 7.443 7.539* 7.491*  PCO2ART 45.3* 31.1* 37.3  PO2ART 67.0* 106* 129*   Liver Enzymes  Recent Labs Lab 09/29/15 0511 09/30/15 0436 10/01/15 0424  AST 104* 77* 68*  ALT 160* 140* 110*  ALKPHOS 168* 193* 187*  BILITOT 5.9* 6.0* 6.4*  ALBUMIN 2.0*  2.0* 1.9* 1.9*   Cardiac Enzymes  Recent Labs Lab 09/27/15 0152  TROPONINI 0.09*   Glucose  Recent Labs Lab 09/28/15 1543 09/28/15 1937 09/28/15 2354 09/29/15 0351 09/29/15 0814 09/29/15 1141  GLUCAP 146* 146* 164* 162* 164* 167*    Imaging Dg Chest Port 1 View  10/01/2015   CLINICAL DATA:  Hypoxia/respiratory failure  EXAM: PORTABLE CHEST 1 VIEW  COMPARISON:  September 30, 2015  FINDINGS: Endotracheal tube tip is 6.1 cm above the carina. Central catheter tip is in the superior vena cava slightly proximal to the cavoatrial junction. No appreciable pneumothorax. There is patchy airspace disease throughout both lungs bilaterally, stable. There is mild underlying interstitial edema. No new opacity. Heart is enlarged with pulmonary venous hypertension. No adenopathy appreciable.  IMPRESSION: No appreciable change from 1 day prior. Extensive although patchy airspace disease bilaterally. Cardiomegaly with pulmonary venous hypertension and mild edema. Suspect a degree of congestive heart failure. There may well be superimposed pneumonia and/ or ARDS. More than one of these entities may exist concurrently. Tube and catheter positions as described without pneumothorax.   Electronically Signed   By: Lowella Grip III M.D.   On: 10/01/2015 07:05   Dg Chest Port 1v Same Day  09/30/2015   CLINICAL DATA:  Multi-system organ failure. Respiratory failure requiring intubation.  EXAM: PORTABLE CHEST 1 VIEW  COMPARISON:  Chest radiograph from earlier today.  FINDINGS: Endotracheal tube tip  is 3.5 cm above the carina. Right internal jugular central venous catheter terminates at the cavoatrial junction. Stable cardiomediastinal silhouette with mild cardiomegaly. No pneumothorax. No pleural effusion. There is severe patchy airspace opacity throughout both lungs, not appreciably changed.  IMPRESSION: 1. Well-positioned endotracheal tube and right internal jugular central venous catheter. No pneumothorax. 2. Stable mild cardiomegaly. No appreciable change in severe patchy airspace opacity throughout both lungs, differential includes severe pulmonary edema, ARDS and/or multifocal pneumonia.   Electronically Signed   By: Ilona Sorrel M.D.   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 Shock - Sepsis versus Cardiac in etiology  P:  Cardiology/Heart Failure Team consulted Dobutamine gtt Levophed gtt to maintain MAP >65 D/C lasix    RENAL A: Acute Renal Failure - Worsening function Mild Hypokalemia - resolved Hyperkalemia - resolved. Hyponatremia - resolved  Metabolic Acidosis - resolved Lactic acidosis - resolved 10/2 >UOP remains high w/ -5.5 L bal   P:   Nephrology following-hold on CRRT for now   Monitoring UOP with Foley catheter Trending daily electrolytes  GASTROINTESTINAL A: Shock liver - improving Upper GIB - secondary to erosive esophagitis on EGD  P:   Cont TF   Trending LFTs daily Protonix IV bid GI consulted - s/p EGD Hepatitis A/B/C negative on serum  HEMATOLOGIC Coagulopathy - Unclear etiology but likely acute liver injury. Improving. S/P  Vitamin K & 4u FFP. Leukocytosis  Anemia - Secondary to upper GIB>improving  Classic Hodgkin lymphoma - FNA/core biopsy 9/27 s/p chemo -Adcetris 9/30  >oncology questioning tumor lysis , uric acid decreased on allopurinol   P:  Oncology following Trending Hgb w/ CBC daily Trending Coagulopathy SCDs Decadron 40mg  IV q24hr>stop 9/30     INFECTIOUS A: Sepsis - Possible CAP  P:   Resp ctx 9/27>>>abundant yeast  BCx2 9/25>>>NEG  UC 9/25>>>negative  Vancomycin 9/25>>> Zosyn 9/25>>>  ENDOCRINE A: Hypoglycemia >resolved   P:   Check CBG, add SSI if elevated   NEUROLOGIC A: Altered Mental Status - Improving. Toxic Metabolic Encephalopathy versus Dilaudid induced  Back pain - unclear etiology. MRI spine negative.  P:   Cont sedation with Diprivan/Fent   FAMILY  - Updates:  Family updated with Dr. Halford Chessman  10/1   TODAY'S SUMMARY: 54 year old male with left neck mass with cervical bx dx of classic Hodgkin lymphoma  Intubated on 9/26 for worsening hypoxic respiratory failure. Continuing on vasopressor & inotropic support. Patient self extubated 9/30 . Overnight with increased WOB despite BIPAP . Reintubated on 10/1 . Seen by Oncology with Lanny Hurst given 9/30. Renal function is worse ,but with good UOP and neg Bal.  Nephrology hold on CRRT for now. Recommeded to stop Lasix for now with neg bal. Co Ox improved on pressor support today.    Tammy Parrett NP-C  [facility]  [phone]    10/01/2015  10:07 AM  Reviewed above.  Remains on increased PEEP, pressors.  Urine outpt adequate.  RASS -2, scattered rhonchi, abd soft, HR regular.  CXR, labs reviewed.  Continue Abx.  Hold lasix.  Hold off on CRRT.  F/u CXR, CBC.  Updated pts wife at bedside.  D/w Dr. Justin Mend.  CC time by me independent of APP time is 34 minutes.  Chesley Mires, MD [facility] 10/01/2015, 12:43 PM Pager:  [phone] After 3pm call: [phone]

## 2015-10-02 ENCOUNTER — Inpatient Hospital Stay (HOSPITAL_COMMUNITY): Payer: BLUE CROSS/BLUE SHIELD

## 2015-10-02 DIAGNOSIS — C8199 Hodgkin lymphoma, unspecified, extranodal and solid organ sites: Secondary | ICD-10-CM | POA: Insufficient documentation

## 2015-10-02 DIAGNOSIS — J189 Pneumonia, unspecified organism: Secondary | ICD-10-CM | POA: Insufficient documentation

## 2015-10-02 DIAGNOSIS — N179 Acute kidney failure, unspecified: Secondary | ICD-10-CM | POA: Insufficient documentation

## 2015-10-02 LAB — COMPREHENSIVE METABOLIC PANEL
ALBUMIN: 1.8 g/dL — AB (ref 3.5–5.0)
ALK PHOS: 175 U/L — AB (ref 38–126)
ALT: 93 U/L — ABNORMAL HIGH (ref 17–63)
ANION GAP: 22 — AB (ref 5–15)
AST: 70 U/L — AB (ref 15–41)
BILIRUBIN TOTAL: 7.1 mg/dL — AB (ref 0.3–1.2)
BUN: 174 mg/dL — AB (ref 6–20)
CALCIUM: 7.5 mg/dL — AB (ref 8.9–10.3)
CO2: 30 mmol/L (ref 22–32)
Chloride: 90 mmol/L — ABNORMAL LOW (ref 101–111)
Creatinine, Ser: 7.54 mg/dL — ABNORMAL HIGH (ref 0.61–1.24)
GFR calc Af Amer: 8 mL/min — ABNORMAL LOW (ref >60)
GFR, EST NON AFRICAN AMERICAN: 7 mL/min — AB (ref >60)
GLUCOSE: 144 mg/dL — AB (ref 65–99)
Potassium: 4.5 mmol/L (ref 3.5–5.1)
Sodium: 142 mmol/L (ref 135–145)
TOTAL PROTEIN: 5.5 g/dL — AB (ref 6.5–8.1)

## 2015-10-02 LAB — CBC
HCT: 32.1 % — ABNORMAL LOW (ref 39.0–52.0)
Hemoglobin: 10 g/dL — ABNORMAL LOW (ref 13.0–17.0)
MCH: 23.6 pg — ABNORMAL LOW (ref 26.0–34.0)
MCHC: 31.2 g/dL (ref 30.0–36.0)
MCV: 75.7 fL — ABNORMAL LOW (ref 78.0–100.0)
PLATELETS: 72 10*3/uL — AB (ref 150–400)
RBC: 4.24 MIL/uL (ref 4.22–5.81)
RDW: 17.9 % — AB (ref 11.5–15.5)
WBC: 23.9 10*3/uL — AB (ref 4.0–10.5)

## 2015-10-02 LAB — APTT: APTT: 30 s (ref 24–37)

## 2015-10-02 LAB — BLOOD GAS, ARTERIAL
Acid-Base Excess: 4.8 mmol/L — ABNORMAL HIGH (ref 0.0–2.0)
BICARBONATE: 28.7 meq/L — AB (ref 20.0–24.0)
Drawn by: 236041
FIO2: 0.5
LHR: 22 {breaths}/min
O2 Saturation: 99.3 %
PCO2 ART: 41.7 mmHg (ref 35.0–45.0)
PEEP: 10 cmH2O
Patient temperature: 98.6
TCO2: 30 mmol/L (ref 0–100)
VT: 470 mL
pH, Arterial: 7.453 — ABNORMAL HIGH (ref 7.350–7.450)
pO2, Arterial: 170 mmHg — ABNORMAL HIGH (ref 80.0–100.0)

## 2015-10-02 LAB — GLUCOSE, CAPILLARY
GLUCOSE-CAPILLARY: 148 mg/dL — AB (ref 65–99)
GLUCOSE-CAPILLARY: 119 mg/dL — AB (ref 65–99)
Glucose-Capillary: 154 mg/dL — ABNORMAL HIGH (ref 65–99)
Glucose-Capillary: 145 mg/dL — ABNORMAL HIGH (ref 65–99)

## 2015-10-02 LAB — PROTIME-INR
INR: 1.49 (ref 0.00–1.49)
Prothrombin Time: 18 seconds — ABNORMAL HIGH (ref 11.6–15.2)

## 2015-10-02 LAB — PROCALCITONIN: Procalcitonin: 10.9 ng/mL

## 2015-10-02 MED ORDER — VITAL HIGH PROTEIN PO LIQD
1000.0000 mL | ORAL | Status: DC
  Administered 2015-10-02: 65 mL
  Administered 2015-10-03 – 2015-10-04 (×2): 1000 mL
  Filled 2015-10-02 (×5): qty 1000

## 2015-10-02 MED ORDER — PRISMASOL BGK 4/2.5 32-4-2.5 MEQ/L IV SOLN
INTRAVENOUS | Status: DC
  Filled 2015-10-02 (×3): qty 5000

## 2015-10-02 MED ORDER — PRISMASOL BGK 4/2.5 32-4-2.5 MEQ/L IV SOLN
INTRAVENOUS | Status: DC
  Filled 2015-10-02 (×7): qty 5000

## 2015-10-02 MED ORDER — SODIUM CHLORIDE 0.9 % IJ SOLN
250.0000 [IU]/h | INTRAMUSCULAR | Status: DC
  Filled 2015-10-02: qty 2

## 2015-10-02 MED ORDER — PIPERACILLIN-TAZOBACTAM IN DEX 2-0.25 GM/50ML IV SOLN
2.2500 g | Freq: Four times a day (QID) | INTRAVENOUS | Status: DC
  Administered 2015-10-02 – 2015-10-03 (×5): 2.25 g via INTRAVENOUS
  Filled 2015-10-02 (×6): qty 50

## 2015-10-02 MED ORDER — SODIUM CHLORIDE 0.9 % FOR CRRT
INTRAVENOUS_CENTRAL | Status: DC | PRN
  Filled 2015-10-02: qty 1000

## 2015-10-02 MED ORDER — IPRATROPIUM-ALBUTEROL 0.5-2.5 (3) MG/3ML IN SOLN: 3.0000 mL | RESPIRATORY_TRACT | Status: DC | PRN

## 2015-10-02 MED ORDER — CHLORHEXIDINE GLUCONATE 0.12% ORAL RINSE (MEDLINE KIT)
15.0000 mL | Freq: Two times a day (BID) | OROMUCOSAL | Status: DC
  Administered 2015-10-02 – 2015-10-06 (×8): 15 mL via OROMUCOSAL

## 2015-10-02 MED ORDER — ANTISEPTIC ORAL RINSE SOLUTION (CORINZ)
7.0000 mL | Freq: Four times a day (QID) | OROMUCOSAL | Status: DC
Start: 2015-10-03 — End: 2015-10-07
  Administered 2015-10-03 – 2015-10-06 (×16): 7 mL via OROMUCOSAL

## 2015-10-02 MED ORDER — HEPARIN BOLUS VIA INFUSION (CRRT)
1000.0000 [IU] | INTRAVENOUS | Status: DC | PRN
  Filled 2015-10-02: qty 1000

## 2015-10-02 MED ORDER — HEPARIN SODIUM (PORCINE) 1000 UNIT/ML DIALYSIS
1000.0000 [IU] | INTRAMUSCULAR | Status: DC | PRN
  Filled 2015-10-02: qty 6

## 2015-10-02 MED ORDER — PANTOPRAZOLE SODIUM 40 MG PO PACK
40.0000 mg | PACK | Freq: Two times a day (BID) | ORAL | Status: DC
  Administered 2015-10-02 – 2015-10-04 (×5): 40 mg
  Filled 2015-10-02 (×6): qty 20

## 2015-10-02 NOTE — Progress Notes (Signed)
PULMONARY / CRITICAL CARE MEDICINE   Name: Paul Morton MRN: [id number] DOB: [birth year]    ADMISSION DATE:  09/16/2015  REFERRING MD :  EDP  CHIEF COMPLAINT:  Sepsis   INITIAL PRESENTATION:  54yo male smoker with recent diagnosis lymphoma (still in early stages of workup) who presented 9/25 with progressive back pain.  In ER found to be tachycardic, hypotensive with lactate 14, AKI and coagulopathy.  PCCM called to admit.    STUDIES:  PET 9/22 - Bulky confluent hypermetabolic left cervical lymphadenopathy involving levels II-V. Given the presence of hepatomegaly with diffuse liver hypermetabolism, a diagnosis of lymphoma is favored, however metastatic carcinoma remains on the differential.  Hypermetabolic patchy ground-glass opacity and consolidation in both upper lobes and right lower lobe, favor an infectious or inflammatory etiology.  TTE 9/26 - EF 10-15% w/o regional wall motion abnormality. Grade 3 diastolic dysfunction. Moderate AR. Moderate MR. RV severely dilated w/ moderate reduction in systolic function. PASP 43 mmHg. No pericardial effusion.  RLE Venous Duplex 9/26 - no DVT or SVT  MRI spine 9/25 - incomplete exam, No evidence of significant spinal stenosis or cord compression in the cervical or visualized thoracic spine.   MRI L-spine 9/27 - suboptimal exam. No malignant or suspicious signal.  SIGNIFICANT EVENTS: 9/25 - Admitted to hospital 9/25 - Right IJ CVC by ED 9/26 - Intubated for pending respiratory failure & transferred to [facility] 9/27 - EGD w/ erosive esophagitis 9/27 - FNA neck mass 9/30 - Self extubation during SBT 10/1 - Reintubated   SUBJECTIVE:  O2 needs better.  Making urine.  Weaning off pressors.   VITAL SIGNS: Temp:  [98.9 F (37.2 C)-99.8 F (37.7 C)] 98.9 F (37.2 C) (10/03 1115) Pulse Rate:  [54-118] 114 (10/03 1115) Resp:  [19-29] 26 (10/03 1115) BP: (86-105)/(64-76) 95/64 mmHg (10/03 1100) SpO2:  [88 %-100 %] 100 % (10/03 1115) FiO2 (%):   [40 %-50 %] 40 % (10/03 0828) Weight:  [184 lb 11.9 oz (83.8 kg)] 184 lb 11.9 oz (83.8 kg) (10/03 0400) HEMODYNAMICS: CVP:  [9 mmHg-16 mmHg] 9 mmHg VENTILATOR SETTINGS: Vent Mode:  [-] PRVC FiO2 (%):  [40 %-50 %] 40 % Set Rate:  [22 bmp] 22 bmp Vt Set:  [470 mL] 470 mL PEEP:  [8 cmH20-10 cmH20] 8 cmH20 Plateau Pressure:  [22 cmH20-24 cmH20] 23 cmH20 INTAKE / OUTPUT:  Intake/Output Summary (Last 24 hours) at 10/02/15 1132 Last data filed at 10/02/15 1100  Gross per 24 hour  Intake 2071.11 ml  Output   2550 ml  Net -478.89 ml    PHYSICAL EXAMINATION: General:  sedated Integument: no rashes HEENT:  ETT in place  Cardiovascular: regular, tachycardic Pulmonary: scattered rhonchi, no wheeze Abdomen: Soft, non tender Neurological: RASS -2  LABS:  CBC  Recent Labs Lab 09/29/15 0511 10/01/15 0424 10/02/15 0438  WBC 27.7* 26.3* 23.9*  HGB 10.0* 10.4* 10.0*  HCT 31.0* 33.3* 32.1*  PLT 74* 54* 72*   Coag's  Recent Labs Lab 09/30/15 0436 10/01/15 0424 10/02/15 0438  APTT 31 30 30   INR 1.67* 1.54* 1.49   BMET  Recent Labs Lab 09/30/15 0436 10/01/15 0424 10/02/15 0438  NA 142 141 142  K 4.6 4.8 4.5  CL 90* 90* 90*  CO2 31 30 30   BUN 156* 168* 174*  CREATININE 6.29* 7.11* 7.54*  GLUCOSE 152* 139* 144*   Electrolytes  Recent Labs Lab 09/28/15 0319  09/29/15 0511 09/30/15 0436 10/01/15 0424 10/02/15 0438  CALCIUM  --   < >  6.6* 6.7* 6.8* 7.5*  MG 2.4  --  2.4  --  2.7*  --   PHOS  --   < > 6.4* 10.1* 10.6*  --   < > = values in this interval not displayed.   Sepsis Markers  Recent Labs Lab 09/25/2015 0541 09/27/15 0524 09/30/15 1519 10/01/15 0424 10/02/15 0438  LATICACIDVEN 2.2* 1.4  --   --   --   PROCALCITON  --   --  11.80 11.78 10.90   ABG  Recent Labs Lab 09/30/15 1445 09/30/15 1620 10/02/15 0420  PHART 7.539* 7.491* 7.453*  PCO2ART 31.1* 37.3 41.7  PO2ART 106* 129* 170*   Liver Enzymes  Recent Labs Lab 09/30/15 0436  10/01/15 0424 10/02/15 0438  AST 77* 68* 70*  ALT 140* 110* 93*  ALKPHOS 193* 187* 175*  BILITOT 6.0* 6.4* 7.1*  ALBUMIN 1.9* 1.9* 1.8*   Cardiac Enzymes  Recent Labs Lab 09/27/15 0152  TROPONINI 0.09*   Glucose  Recent Labs Lab 09/28/15 2354 09/29/15 0351 09/29/15 0814 09/29/15 1141 10/02/15 0348 10/02/15 0804  GLUCAP 164* 162* 164* 167* 154* 148*    Imaging Dg Chest Port 1 View  10/02/2015   CLINICAL DATA:  Pneumonia.  EXAM: PORTABLE CHEST 1 VIEW  COMPARISON:  10/01/2015.  FINDINGS: Endotracheal tube and right IJ line in stable position. Interim placement of NG tube, its tip is below the left hemidiaphragm. Cardiomegaly with normal pulmonary vascularity. Multifocal pulmonary infiltrates are noted, improved from prior exam. No pleural effusion or pneumothorax.  IMPRESSION: 1. Interim placement of NG tube, its tip is below left hemidiaphragm. Endotracheal tube and right IJ line in stable position. 2. Interim improvement of multifocal bilateral pulmonary infiltrates. 3. Stable cardiomegaly.   Electronically Signed   By: Marcello Moores  Register   On: 10/02/2015 07:27   Dg Abd Portable 1v  10/01/2015   CLINICAL DATA:  Enteric tube placement  EXAM: PORTABLE ABDOMEN - 1 VIEW  COMPARISON:  09/27/2015 abdominal radiograph  FINDINGS: Weighted enteric tube tip is in the body of the stomach. No disproportionately dilated small bowel loops in the visualized abdomen. No evidence of pneumatosis or pneumoperitoneum. Stable 4 mm linear calcification overlying the left upper kidney, either a vascular calcification or left renal stone. Moderate degenerative changes in the visualized thoracolumbar spine. Cardiomegaly. Bibasilar lung opacities.  IMPRESSION: Weighted enteric tube tip is in the body of the stomach.   Electronically Signed   By: Ilona Sorrel M.D.   On: 10/01/2015 15:26    ASSESSMENT / PLAN:  PULMONARY ETT 9/26>>>9/30 (self extubated)>10/1  A: Acute hypoxic respiratory failure 2nd to  HCAP with ARDS >> less likely pulmonary edema. Hx of tobacco abuse. 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 Shock - combination of sepsis and cardiac. P:  Hold diuresis >> likely contributing to renal failure Wean levophed to keep MAP > 65 Dobutamine per cardiology  RENAL A: Acute Renal Failure >> likely from CHF, sepsis, and diuresis >> creatinine might be reaching plateau. P:   D/w Dr. Jimmy Footman >> hold off on CRRT for now >> if creatinine continues to rise, then will need CRRT F/u BMET, monitor urine outpt  GASTROINTESTINAL A: Shock liver - improving. Upper GIB - secondary to erosive esophagitis on EGD. Protein calorie malnutrition. P:   Tube feeds BID protonix F/u LFT's  HEMATOLOGIC A:  New dx of Hodgkin's lymphoma. Coagulopathy >> resolved. Anemia of critical illness, and GI bleed. Thrombocytopenia. P:  F/u CBC, INR SCDs Allopurinol per oncology Chemotherapy plans per oncology  INFECTIOUS A: Septic shock 2nd to HCAP >> procalcitonin trending down. P:   Vancomycin 9/25>>> Zosyn 9/25>>> Zovirax for prophylaxis  Sputum 10/01 >>  ENDOCRINE A: Hyperglycemia. P:   SSI if CBG > 180  NEUROLOGIC A: Acute metabolic encephalopathy. P:   RASS goal -1  SUMMARY: His O2 and pressor needs are slowly improving.  He is making urine.  Hopefully his renal fx has reached plateau, and will start to trend down.  D/w Dr. Jimmy Footman >> will hold off on CRRT 10/03 and reassess 10/04.  CC time 44 minutes.   Chesley Mires, MD [facility] Pulmonary/Critical Care 10/02/2015, 11:32 AM Pager:  [phone] After 3pm call: [phone]

## 2015-10-02 NOTE — Progress Notes (Signed)
Advanced Heart Failure Team Rounding Note  Referring Physician: Dr Vaughan Browner Primary Physician: Primary Cardiologist:  None Oncologist: Dr Marin Olp  Reason for Consultation: Acute Systolic Heart Failure   HPI:    Paul Morton is a 54 year old male with h/o tobacco and ETOH abuse who recently developed large cervical adenopathy and intractable back pain. Underwent PET scan earlier this month which showed hypermetabolic left cervical lymphadenopathy involving levels II-IV and presence of hepatomegaly with diffuse liver hypermetabolism suggesting probably lymphoma.   Admitted 9/26 with multisystem organ failure. Echo performed and EF 10-15% with moderate RV dysfunction. Initial co-ox drawn was 55%. Started on milrinone. Intubated and transferred to [facility]. Developed large GI bleed. EGDwith esophagitis.   Underwent core biopsy of cervical node 9/27. ->  Hodgkin's lymphoma  Adcetria given 9/30 for Hodgkins.  Reintubated. Remains on dobutamine and norep. Creatinine continues to worsen. Off diuretics.     Objective:    Vital Signs:   Temp:  [98.9 F (37.2 C)-99.8 F (37.7 C)] 98.9 F (37.2 C) (10/03 1115) Pulse Rate:  [54-118] 114 (10/03 1115) Resp:  [19-29] 26 (10/03 1115) BP: (86-105)/(64-76) 95/64 mmHg (10/03 1100) SpO2:  [88 %-100 %] 100 % (10/03 1115) FiO2 (%):  [40 %-50 %] 40 % (10/03 1204) Weight:  [184 lb 11.9 oz (83.8 kg)] 184 lb 11.9 oz (83.8 kg) (10/03 0400)    Weight change: Filed Weights   09/30/15 0500 10/01/15 0500 10/02/15 0400  Weight: 187 lb 9.8 oz (85.1 kg) 192 lb 0.3 oz (87.1 kg) 184 lb 11.9 oz (83.8 kg)    Intake/Output:   Intake/Output Summary (Last 24 hours) at 10/02/15 1213 Last data filed at 10/02/15 1100  Gross per 24 hour  Intake 1956.51 ml  Output   2550 ml  Net -593.49 ml     Physical Exam: General:  Intubated/sedated HEENT: normal  Neck: supple. RIJ TLC . Prominent left neck lymphadenopathy Cor: PMI laterally displaced. regular + s3 Lungs:  clear anteriorly   Abdomen: soft, nontender, mildly distended. No bruits or masses. Good bowel sounds. Extremities: no cyanosis, clubbing, rash, no edema Neuro: sedated   Telemetry: sinus tach. 110s.   Labs: Basic Metabolic Panel:  Recent Labs Lab 09/27/15 0152 09/27/15 0522 09/28/15 0319 09/28/15 0932 09/29/15 3557 09/30/15 0436 10/01/15 0424 10/02/15 0438  NA 140 139  --  143 143 142 141 142  K 3.6 3.9  --  3.3* 3.7 4.6 4.8 4.5  CL 96* 95*  --  94* 92* 90* 90* 90*  CO2 27 28  --  31 33* 31 30 30   GLUCOSE 174* 161*  --  164* 160* 152* 139* 144*  BUN 120* 118*  --  124* 128* 156* 168* 174*  CREATININE 4.94* 4.95*  --  5.34* 5.87* 6.29* 7.11* 7.54*  CALCIUM 5.9* 5.8*  --  6.2* 6.6* 6.7* 6.8* 7.5*  MG 2.5* 2.5* 2.4  --  2.4  --  2.7*  --   PHOS 6.2* 6.3*  --  6.2* 6.4* 10.1* 10.6*  --     Liver Function Tests:  Recent Labs Lab 09/28/15 0319 09/28/15 0347 09/29/15 0511 09/30/15 0436 10/01/15 0424 10/02/15 0438  AST 151*  --  104* 77* 68* 70*  ALT 190*  --  160* 140* 110* 93*  ALKPHOS 160*  --  168* 193* 187* 175*  BILITOT 6.4*  --  5.9* 6.0* 6.4* 7.1*  PROT 5.8*  --  5.9* 5.6* 5.6* 5.5*  ALBUMIN 2.0* 1.9* 2.0*  2.0*  1.9* 1.9* 1.8*   No results for input(s): LIPASE, AMYLASE in the last 168 hours.  Recent Labs Lab 09/25/15 1523  AMMONIA 37*    CBC:  Recent Labs Lab 09/25/15 1524 09/08/2015 0539 09/27/15 0522 09/28/15 0319 09/29/15 0511 10/01/15 0424 10/02/15 0438  WBC 15.0* 14.2* 23.0* 25.3* 27.7* 26.3* 23.9*  NEUTROABS 13.7* 13.3* 21.9* 24.0* 27.1*  --   --   HGB 11.0* 9.9* 9.7* 9.6* 10.0* 10.4* 10.0*  HCT 34.9* 31.0* 30.3* 30.0* 31.0* 33.3* 32.1*  MCV 78.3 75.4* 74.6* 73.9* 74.5* 76.6* 75.7*  PLT 327 258 192 125* 74* 54* 72*    Cardiac Enzymes:  Recent Labs Lab 09/25/15 1524 09/16/2015 0539 09/27/15 0152  CKTOTAL 938* 674*  --   TROPONINI  --   --  0.09*    BNP: BNP (last 3 results) No results for input(s): BNP in the last 8760  hours.  ProBNP (last 3 results) No results for input(s): PROBNP in the last 8760 hours.   CBG:  Recent Labs Lab 09/29/15 0814 09/29/15 1141 10/02/15 0348 10/02/15 0804 10/02/15 1119  GLUCAP 164* 167* 154* 148* 145*    Coagulation Studies:  Recent Labs  09/30/15 0436 10/01/15 0424 10/02/15 0438  LABPROT 19.7* 18.5* 18.0*  INR 1.67* 1.54* 1.49    Other results: EKG: ST 107 RBBB/LAFB  Imaging: Dg Chest Port 1 View  10/02/2015   CLINICAL DATA:  Pneumonia.  EXAM: PORTABLE CHEST 1 VIEW  COMPARISON:  10/01/2015.  FINDINGS: Endotracheal tube and right IJ line in stable position. Interim placement of NG tube, its tip is below the left hemidiaphragm. Cardiomegaly with normal pulmonary vascularity. Multifocal pulmonary infiltrates are noted, improved from prior exam. No pleural effusion or pneumothorax.  IMPRESSION: 1. Interim placement of NG tube, its tip is below left hemidiaphragm. Endotracheal tube and right IJ line in stable position. 2. Interim improvement of multifocal bilateral pulmonary infiltrates. 3. Stable cardiomegaly.   Electronically Signed   By: Marcello Moores  Register   On: 10/02/2015 07:27   Dg Chest Port 1 View  10/01/2015   CLINICAL DATA:  Hypoxia/respiratory failure  EXAM: PORTABLE CHEST 1 VIEW  COMPARISON:  September 30, 2015  FINDINGS: Endotracheal tube tip is 6.1 cm above the carina. Central catheter tip is in the superior vena cava slightly proximal to the cavoatrial junction. No appreciable pneumothorax. There is patchy airspace disease throughout both lungs bilaterally, stable. There is mild underlying interstitial edema. No new opacity. Heart is enlarged with pulmonary venous hypertension. No adenopathy appreciable.  IMPRESSION: No appreciable change from 1 day prior. Extensive although patchy airspace disease bilaterally. Cardiomegaly with pulmonary venous hypertension and mild edema. Suspect a degree of congestive heart failure. There may well be superimposed pneumonia  and/ or ARDS. More than one of these entities may exist concurrently. Tube and catheter positions as described without pneumothorax.   Electronically Signed   By: Lowella Grip III M.D.   On: 10/01/2015 07:05   Dg Chest Port 1v Same Day  09/30/2015   CLINICAL DATA:  Multi-system organ failure. Respiratory failure requiring intubation.  EXAM: PORTABLE CHEST 1 VIEW  COMPARISON:  Chest radiograph from earlier today.  FINDINGS: Endotracheal tube tip is 3.5 cm above the carina. Right internal jugular central venous catheter terminates at the cavoatrial junction. Stable cardiomediastinal silhouette with mild cardiomegaly. No pneumothorax. No pleural effusion. There is severe patchy airspace opacity throughout both lungs, not appreciably changed.  IMPRESSION: 1. Well-positioned endotracheal tube and right internal jugular central venous catheter. No pneumothorax.  2. Stable mild cardiomegaly. No appreciable change in severe patchy airspace opacity throughout both lungs, differential includes severe pulmonary edema, ARDS and/or multifocal pneumonia.   Electronically Signed   By: Ilona Sorrel M.D.   On: 09/30/2015 14:28   Dg Abd Portable 1v  10/01/2015   CLINICAL DATA:  Enteric tube placement  EXAM: PORTABLE ABDOMEN - 1 VIEW  COMPARISON:  09/27/2015 abdominal radiograph  FINDINGS: Weighted enteric tube tip is in the body of the stomach. No disproportionately dilated small bowel loops in the visualized abdomen. No evidence of pneumatosis or pneumoperitoneum. Stable 4 mm linear calcification overlying the left upper kidney, either a vascular calcification or left renal stone. Moderate degenerative changes in the visualized thoracolumbar spine. Cardiomegaly. Bibasilar lung opacities.  IMPRESSION: Weighted enteric tube tip is in the body of the stomach.   Electronically Signed   By: Ilona Sorrel M.D.   On: 10/01/2015 15:26     Medications:     Current Medications: . acyclovir  400 mg Per Tube Daily  .  allopurinol  100 mg Per Tube Daily  . antiseptic oral rinse  7 mL Mouth Rinse Q4H  . chlorhexidine gluconate  15 mL Mouth Rinse BID  . feeding supplement (VITAL HIGH PROTEIN)  1,000 mL Per Tube Q24H  . nicotine  7 mg Transdermal Daily  . pantoprazole sodium  40 mg Per Tube BID  . piperacillin-tazobactam (ZOSYN)  IV  2.25 g Intravenous 4 times per day    Infusions: . sodium chloride 10 mL/hr (10/02/15 0732)  . DOBUTamine 5 mcg/kg/min (10/02/15 1100)  . norepinephrine (LEVOPHED) Adult infusion 8 mcg/min (10/02/15 1100)  . propofol (DIPRIVAN) infusion 30 mcg/kg/min (10/02/15 1100)     Assessment:   1. Cardiogenic shock 2. Acute systolic HF EF 45-80%, etiology uncertain.  Brother has dilated cardiomyopathy so possibly familial.  Reports drinking 6 beers/day long-term.  3. Acute respiratory failure 4. Multisystem organ failure 5. Acute kidney injury 6. Shock liver 7. Hyperkalemia 8. Cervical lymphadenopathy - path appears to be Hodgkins lymphoma. Adcetria given 9/30    --s/p core biopsy 9/27 9. Tobacco use 10. ETOH use - 4+ beers per day 11. Family h/o NICM in brother 69. Massive upper GI bleed     --severe esophagitis by EGD on 9/27 13. Coagulopathy - due to shock liver with ? DIC 14. ?CAP  Plan/Discussion:   Remains intubated.   Remains on dobutamine 5 mcg and levophed 8 mcg. CVP down to 7. Diuretics on hold.  CXR with improvement of pulmonary infiltrates.     Per Nephrology--Considering CRRT. Urine output ok.    Paul Morton,Paul Morton  10/02/2015 12:13 PM Advanced Heart Failure Team Pager [phone] (M-F; 7a - 4p)  Please contact [facility] for night-coverage after hours (4p -7a ) and weekends on [email or web address]  Patient seen and examined with Paul Grinder, NP. We discussed all aspects of the encounter. I agree with the assessment and plan as stated above.   He remains critically ill. Co-ox improved on dobutamine and levophed. Has diuresed well but BUN/Cr very high (not  clearing). I think he needs to start CRRT and probably need to get catheter in prior to counts dropping further from chemo. Given Hodgkin's and multi-system organ failure prognosis appears very tenuous.   The patient is critically ill with multiple organ systems failure and requires high complexity decision making for assessment and support, frequent evaluation and titration of therapies, application of advanced monitoring technologies and extensive interpretation of multiple databases.   Critical  Care Time devoted to patient care services described in this note is 40 Minutes.  Paul Morton, Daniel,MD 6:25 PM

## 2015-10-02 NOTE — Progress Notes (Addendum)
Nutrition Follow-up  DOCUMENTATION CODES:   Not applicable  INTERVENTION:    Continue TF via OGT with Vital High Protein, increase goal rate to 65 ml/h (1560 ml per day) to provide 1560 kcals, 137 gm protein, 1304 ml free water daily.  Total intake with calories from propofol and TF will be 1964 kcals per day (96% of estimated needs).  NUTRITION DIAGNOSIS:   Inadequate oral intake related to inability to eat as evidenced by NPO status.  Ongoing   GOAL:   Patient will meet greater than or equal to 90% of their needs  Unmet, progressing  MONITOR:   Vent status, Weight trends, Labs, I & O's  REASON FOR ASSESSMENT:   Consult Enteral/tube feeding initiation and management  ASSESSMENT:   54yo male smoker with recent diagnosis lymphoma (still in early stages of workup) who presented 9/25 with progressive back pain. In ER found to be tachycardic, hypotensive with lactate 14, AKI and coagulopathy.   Patient was extubated on 9/30, required re-intubation on 10/1. Received MD Consult for TF initiation and management. CRRT has been ordered, but no line yet.  Patient is currently intubated on ventilator support MV: 10.7 L/min Temp (24hrs), Avg:99.4 F (37.4 C), Min:98.9 F (37.2 C), Max:99.8 F (37.7 C)  Propofol: 15.3 ml/hr providing 404 kcals per day   Diet Order:  Diet NPO time specified  Skin:  Reviewed, no issues  Last BM:  unknown  Height:   Ht Readings from Last 1 Encounters:  09/25/15 5' 11.5" (1.816 m)    Weight:   Wt Readings from Last 1 Encounters:  10/02/15 184 lb 11.9 oz (83.8 kg)    Ideal Body Weight:  79.5 kg  BMI:  Body mass index is 25.41 kg/(m^2).  Estimated Nutritional Needs:   Kcal:  2054  Protein:  130-145 gm  Fluid:  2.1-2.3 L  EDUCATION NEEDS:   No education needs identified at this time  Molli Barrows, [place], [place], [place] Pager [phone] After Hours Pager [phone]

## 2015-10-02 NOTE — Progress Notes (Signed)
Dr Halford Chessman informed of CRRT orders and need for catheter placement

## 2015-10-02 NOTE — Progress Notes (Signed)
Subjective: Interval History: has no complaint, entub ,sedated.  Objective: Vital signs in last 24 hours: Temp:  [99.4 F (37.4 C)-99.9 F (37.7 C)] 99.8 F (37.7 C) (10/03 0500) Pulse Rate:  [54-120] 117 (10/03 0500) Resp:  [19-29] 22 (10/03 0500) BP: (86-105)/(65-75) 97/65 mmHg (10/03 0500) SpO2:  [88 %-100 %] 100 % (10/03 0500) FiO2 (%):  [40 %-50 %] 40 % (10/03 0521) Weight:  [83.8 kg (184 lb 11.9 oz)] 83.8 kg (184 lb 11.9 oz) (10/03 0400) Weight change: -3.3 kg (-7 lb 4.4 oz)  Intake/Output from previous day: 10/02 0701 - 10/03 0700 In: 1751.4 [I.V.:1541.4; NG/GT:60; IV Piggyback:150] Out: 1650 [Urine:1650] Intake/Output this shift:    General appearance: pale and sedated, pale, not responding Resp: rhonchi bilaterally and wheezes bilaterally Cardio: S1, S2 normal and systolic murmur: holosystolic 2/6, blowing at apex GI: pos bs,soft, liver down 6 cm Extremities: edema 3+  Lab Results:  Recent Labs  10/01/15 0424 10/02/15 0438  WBC 26.3* 23.9*  HGB 10.4* 10.0*  HCT 33.3* 32.1*  PLT 54* 72*   BMET:  Recent Labs  10/01/15 0424 10/02/15 0438  NA 141 142  K 4.8 4.5  CL 90* 90*  CO2 30 30  GLUCOSE 139* 144*  BUN 168* 174*  CREATININE 7.11* 7.54*  CALCIUM 6.8* 7.5*   No results for input(s): PTH in the last 72 hours. Iron Studies: No results for input(s): IRON, TIBC, TRANSFERRIN, FERRITIN in the last 72 hours.  Studies/Results: Dg Chest Port 1 View  10/01/2015   CLINICAL DATA:  Hypoxia/respiratory failure  EXAM: PORTABLE CHEST 1 VIEW  COMPARISON:  September 30, 2015  FINDINGS: Endotracheal tube tip is 6.1 cm above the carina. Central catheter tip is in the superior vena cava slightly proximal to the cavoatrial junction. No appreciable pneumothorax. There is patchy airspace disease throughout both lungs bilaterally, stable. There is mild underlying interstitial edema. No new opacity. Heart is enlarged with pulmonary venous hypertension. No adenopathy  appreciable.  IMPRESSION: No appreciable change from 1 day prior. Extensive although patchy airspace disease bilaterally. Cardiomegaly with pulmonary venous hypertension and mild edema. Suspect a degree of congestive heart failure. There may well be superimposed pneumonia and/ or ARDS. More than one of these entities may exist concurrently. Tube and catheter positions as described without pneumothorax.   Electronically Signed   By: Lowella Grip III M.D.   On: 10/01/2015 07:05   Dg Chest Port 1v Same Day  09/30/2015   CLINICAL DATA:  Multi-system organ failure. Respiratory failure requiring intubation.  EXAM: PORTABLE CHEST 1 VIEW  COMPARISON:  Chest radiograph from earlier today.  FINDINGS: Endotracheal tube tip is 3.5 cm above the carina. Right internal jugular central venous catheter terminates at the cavoatrial junction. Stable cardiomediastinal silhouette with mild cardiomegaly. No pneumothorax. No pleural effusion. There is severe patchy airspace opacity throughout both lungs, not appreciably changed.  IMPRESSION: 1. Well-positioned endotracheal tube and right internal jugular central venous catheter. No pneumothorax. 2. Stable mild cardiomegaly. No appreciable change in severe patchy airspace opacity throughout both lungs, differential includes severe pulmonary edema, ARDS and/or multifocal pneumonia.   Electronically Signed   By: Ilona Sorrel M.D.   On: 09/30/2015 14:28   Dg Abd Portable 1v  10/01/2015   CLINICAL DATA:  Enteric tube placement  EXAM: PORTABLE ABDOMEN - 1 VIEW  COMPARISON:  09/27/2015 abdominal radiograph  FINDINGS: Weighted enteric tube tip is in the body of the stomach. No disproportionately dilated small bowel loops in the visualized abdomen. No  evidence of pneumatosis or pneumoperitoneum. Stable 4 mm linear calcification overlying the left upper kidney, either a vascular calcification or left renal stone. Moderate degenerative changes in the visualized thoracolumbar spine.  Cardiomegaly. Bibasilar lung opacities.  IMPRESSION: Weighted enteric tube tip is in the body of the stomach.   Electronically Signed   By: Ilona Sorrel M.D.   On: 10/01/2015 15:26    I have reviewed the patient's current medications.  Assessment/Plan: 1 AKI  Nonoliguric but no clearance.  K/acid base ok.  Will restart CRRT. Some vol xs 2 Hodgkins 3 Sepsis Vanco/Primax 4 CM vol xs 5 Resp failure secretions and vol xs 6 anemia 7 Malnutrition P CRRT, lower solute, vol, Vent , AB nutrition, inotropes    LOS: 8 days   Maxie Debose L 10/02/2015,7:02 AM

## 2015-10-02 NOTE — Progress Notes (Signed)
[name] is intubated again. I'm not sure exactly what happened over the weekend. He does have a feeding tube in place.  He tolerated the Adcetris well. The lymphadenopathy in the left neck appears to be less prominent already.  He continues on pressor support. I think because of the pressor support, his kidney function is suffering.  To me, it certainly seems as if everything is depending upon his cardiac function.  Unfortunately, his renal function continues to deteriorate. His BUN is 174 and creatinine of 7.5. I think is still making urine but his urine output might be decreasing. I don't know if he will need dialysis.  His blood counts are holding pretty steady. His white cell count is 24. Hemoglobin 10 and platelet count 72,000. His bilirubin is going up. However, his liver function tests are somewhat improved. His INR is 1.5.  His physical exam shows a blood pressure of 97/65. His temperature 99.8. Pulse is 117. On his exam, he has decreased left cervical adenopathy. His lungs show some decrease in the bases. Cardiac exam tachycardic but regular. Abdomen is soft. Bowel sounds are decreased. There is no obvious hepatomegaly or spleen megaly. Extremities shows some mild edema in his legs.  For now, we'll continue to follow along. I do not believe that there is any issues with respect to tumor lysis. I think that his main problem is his incredibly poor cardiac status and the fact that he needs pressor support and I think this is leading to decreased kidney function.  I do appreciate everybody's help.  Pete E.  1 Timothy 2:8

## 2015-10-02 NOTE — Progress Notes (Signed)
Utilization review complete. Ramsha Lonigro RN CCM Case Mgmt phone [phone] 

## 2015-10-03 ENCOUNTER — Inpatient Hospital Stay (HOSPITAL_COMMUNITY): Payer: BLUE CROSS/BLUE SHIELD

## 2015-10-03 DIAGNOSIS — J80 Acute respiratory distress syndrome: Secondary | ICD-10-CM | POA: Insufficient documentation

## 2015-10-03 LAB — RENAL FUNCTION PANEL
ALBUMIN: 1.8 g/dL — AB (ref 3.5–5.0)
ANION GAP: 24 — AB (ref 5–15)
BUN: 190 mg/dL — ABNORMAL HIGH (ref 6–20)
CHLORIDE: 87 mmol/L — AB (ref 101–111)
CO2: 29 mmol/L (ref 22–32)
Calcium: 7.2 mg/dL — ABNORMAL LOW (ref 8.9–10.3)
Creatinine, Ser: 7.65 mg/dL — ABNORMAL HIGH (ref 0.61–1.24)
GFR, EST AFRICAN AMERICAN: 8 mL/min — AB (ref >60)
GFR, EST NON AFRICAN AMERICAN: 7 mL/min — AB (ref >60)
Glucose, Bld: 118 mg/dL — ABNORMAL HIGH (ref 65–99)
PHOSPHORUS: 11.3 mg/dL — AB (ref 2.5–4.6)
POTASSIUM: 4.3 mmol/L (ref 3.5–5.1)
Sodium: 140 mmol/L (ref 135–145)

## 2015-10-03 LAB — COMPREHENSIVE METABOLIC PANEL
ALT: 81 U/L — AB (ref 17–63)
AST: 69 U/L — ABNORMAL HIGH (ref 15–41)
Albumin: 1.8 g/dL — ABNORMAL LOW (ref 3.5–5.0)
Alkaline Phosphatase: 186 U/L — ABNORMAL HIGH (ref 38–126)
Anion gap: 20 — ABNORMAL HIGH (ref 5–15)
BUN: 200 mg/dL — ABNORMAL HIGH (ref 6–20)
CHLORIDE: 89 mmol/L — AB (ref 101–111)
CO2: 31 mmol/L (ref 22–32)
CREATININE: 7.7 mg/dL — AB (ref 0.61–1.24)
Calcium: 7.3 mg/dL — ABNORMAL LOW (ref 8.9–10.3)
GFR, EST AFRICAN AMERICAN: 8 mL/min — AB (ref >60)
GFR, EST NON AFRICAN AMERICAN: 7 mL/min — AB (ref >60)
Glucose, Bld: 128 mg/dL — ABNORMAL HIGH (ref 65–99)
POTASSIUM: 4.2 mmol/L (ref 3.5–5.1)
Sodium: 140 mmol/L (ref 135–145)
Total Bilirubin: 6.7 mg/dL — ABNORMAL HIGH (ref 0.3–1.2)
Total Protein: 5.1 g/dL — ABNORMAL LOW (ref 6.5–8.1)

## 2015-10-03 LAB — CBC
HCT: 29.3 % — ABNORMAL LOW (ref 39.0–52.0)
Hemoglobin: 9.5 g/dL — ABNORMAL LOW (ref 13.0–17.0)
MCH: 24.4 pg — AB (ref 26.0–34.0)
MCHC: 32.4 g/dL (ref 30.0–36.0)
MCV: 75.1 fL — AB (ref 78.0–100.0)
PLATELETS: 101 10*3/uL — AB (ref 150–400)
RBC: 3.9 MIL/uL — ABNORMAL LOW (ref 4.22–5.81)
RDW: 17.8 % — AB (ref 11.5–15.5)
WBC: 21.4 10*3/uL — ABNORMAL HIGH (ref 4.0–10.5)

## 2015-10-03 LAB — GLUCOSE, CAPILLARY
GLUCOSE-CAPILLARY: 129 mg/dL — AB (ref 65–99)
Glucose-Capillary: 132 mg/dL — ABNORMAL HIGH (ref 65–99)
Glucose-Capillary: 134 mg/dL — ABNORMAL HIGH (ref 65–99)

## 2015-10-03 LAB — PROTIME-INR
INR: 1.42 (ref 0.00–1.49)
Prothrombin Time: 17.5 seconds — ABNORMAL HIGH (ref 11.6–15.2)

## 2015-10-03 LAB — POCT ACTIVATED CLOTTING TIME
ACTIVATED CLOTTING TIME: 147 s
ACTIVATED CLOTTING TIME: 153 s
ACTIVATED CLOTTING TIME: 165 s
ACTIVATED CLOTTING TIME: 165 s
ACTIVATED CLOTTING TIME: 165 s
ACTIVATED CLOTTING TIME: 165 s
Activated Clotting Time: 159 seconds

## 2015-10-03 LAB — CULTURE, RESPIRATORY W GRAM STAIN

## 2015-10-03 LAB — CARBOXYHEMOGLOBIN
CARBOXYHEMOGLOBIN: 1.7 % — AB (ref 0.5–1.5)
Methemoglobin: 1 % (ref 0.0–1.5)
O2 Saturation: 59.9 %
Total hemoglobin: 9.7 g/dL — ABNORMAL LOW (ref 13.5–18.0)

## 2015-10-03 LAB — PHOSPHORUS: PHOSPHORUS: 11.1 mg/dL — AB (ref 2.5–4.6)

## 2015-10-03 LAB — CULTURE, RESPIRATORY

## 2015-10-03 LAB — LACTATE DEHYDROGENASE: LDH: 534 U/L — AB (ref 98–192)

## 2015-10-03 LAB — TRIGLYCERIDES: TRIGLYCERIDES: 434 mg/dL — AB (ref <150)

## 2015-10-03 MED ORDER — FENTANYL BOLUS VIA INFUSION
50.0000 ug | INTRAVENOUS | Status: DC | PRN
  Filled 2015-10-03: qty 50

## 2015-10-03 MED ORDER — PRISMASOL BGK 4/2.5 32-4-2.5 MEQ/L IV SOLN
INTRAVENOUS | Status: DC
  Administered 2015-10-03 – 2015-10-13 (×64): via INTRAVENOUS_CENTRAL
  Filled 2015-10-03 (×83): qty 5000

## 2015-10-03 MED ORDER — HEPARIN BOLUS VIA INFUSION (CRRT)
1000.0000 [IU] | INTRAVENOUS | Status: DC | PRN
  Administered 2015-10-05: 1000 [IU] via INTRAVENOUS_CENTRAL
  Filled 2015-10-03 (×2): qty 1000

## 2015-10-03 MED ORDER — SODIUM CHLORIDE 0.9 % FOR CRRT
INTRAVENOUS_CENTRAL | Status: DC | PRN
  Filled 2015-10-03: qty 1000

## 2015-10-03 MED ORDER — SODIUM CHLORIDE 0.9 % IJ SOLN
250.0000 [IU]/h | INTRAMUSCULAR | Status: DC
  Administered 2015-10-03: 250 [IU]/h via INTRAVENOUS_CENTRAL
  Administered 2015-10-04: 1100 [IU]/h via INTRAVENOUS_CENTRAL
  Administered 2015-10-04: 1450 [IU]/h via INTRAVENOUS_CENTRAL
  Administered 2015-10-04: 1300 [IU]/h via INTRAVENOUS_CENTRAL
  Administered 2015-10-05 (×3): 2500 [IU]/h via INTRAVENOUS_CENTRAL
  Administered 2015-10-05: 1800 [IU]/h via INTRAVENOUS_CENTRAL
  Administered 2015-10-05: 2050 [IU]/h via INTRAVENOUS_CENTRAL
  Administered 2015-10-06 – 2015-10-12 (×33): 2500 [IU]/h via INTRAVENOUS_CENTRAL
  Filled 2015-10-03 (×48): qty 2

## 2015-10-03 MED ORDER — PRISMASOL BGK 4/2.5 32-4-2.5 MEQ/L IV SOLN
INTRAVENOUS | Status: DC
  Administered 2015-10-03 – 2015-10-12 (×30): via INTRAVENOUS_CENTRAL
  Filled 2015-10-03 (×35): qty 5000

## 2015-10-03 MED ORDER — LANTHANUM CARBONATE 500 MG PO CHEW
1000.0000 mg | CHEWABLE_TABLET | Freq: Three times a day (TID) | ORAL | Status: DC
  Administered 2015-10-03 (×3): 1000 mg via ORAL
  Filled 2015-10-03 (×7): qty 2

## 2015-10-03 MED ORDER — HEPARIN SODIUM (PORCINE) 1000 UNIT/ML DIALYSIS
1000.0000 [IU] | INTRAMUSCULAR | Status: DC | PRN
  Filled 2015-10-03: qty 6

## 2015-10-03 MED ORDER — PRISMASOL BGK 4/2.5 32-4-2.5 MEQ/L IV SOLN
INTRAVENOUS | Status: DC
  Administered 2015-10-03 – 2015-10-13 (×36): via INTRAVENOUS_CENTRAL
  Filled 2015-10-03 (×41): qty 5000

## 2015-10-03 MED ORDER — FENTANYL CITRATE (PF) 2500 MCG/50ML IJ SOLN
25.0000 ug/h | INTRAMUSCULAR | Status: DC
  Administered 2015-10-03: 50 ug/h via INTRAVENOUS
  Administered 2015-10-04: 200 ug/h via INTRAVENOUS
  Administered 2015-10-05: 100 ug/h via INTRAVENOUS
  Filled 2015-10-03 (×3): qty 50

## 2015-10-03 MED ORDER — MIDAZOLAM HCL 2 MG/2ML IJ SOLN
2.0000 mg | INTRAMUSCULAR | Status: DC | PRN
  Administered 2015-10-03: 2 mg via INTRAVENOUS
  Filled 2015-10-03: qty 2

## 2015-10-03 MED ORDER — FENTANYL CITRATE (PF) 100 MCG/2ML IJ SOLN: 50.0000 ug | Freq: Once | INTRAMUSCULAR | Status: DC

## 2015-10-03 NOTE — Progress Notes (Addendum)
PULMONARY / CRITICAL CARE MEDICINE   Name: Paul Morton MRN: [id number] DOB: [birth year]    ADMISSION DATE:  09/07/2015  REFERRING MD :  EDP  CHIEF COMPLAINT:  Sepsis   INITIAL PRESENTATION:  54yo male smoker with recent diagnosis lymphoma (still in early stages of workup) who presented 9/25 with progressive back pain.  In ER found to be tachycardic, hypotensive with lactate 14, AKI and coagulopathy.  PCCM called to admit.    STUDIES:  PET 9/22 - Bulky confluent hypermetabolic left cervical lymphadenopathy involving levels II-V. Given the presence of hepatomegaly with diffuse liver hypermetabolism, a diagnosis of lymphoma is favored, however metastatic carcinoma remains on the differential.  Hypermetabolic patchy ground-glass opacity and consolidation in both upper lobes and right lower lobe, favor an infectious or inflammatory etiology.  TTE 9/26 - EF 10-15% w/o regional wall motion abnormality. Grade 3 diastolic dysfunction. Moderate AR. Moderate MR. RV severely dilated w/ moderate reduction in systolic function. PASP 43 mmHg. No pericardial effusion.  RLE Venous Duplex 9/26 - no DVT or SVT  MRI spine 9/25 - incomplete exam, No evidence of significant spinal stenosis or cord compression in the cervical or visualized thoracic spine.   MRI L-spine 9/27 - suboptimal exam. No malignant or suspicious signal.  SIGNIFICANT EVENTS: 9/25 - Admitted to hospital 9/25 - Right IJ CVC by ED 9/26 - Intubated for pending respiratory failure & transferred to [facility] 9/27 - EGD w/ erosive esophagitis 9/27 - FNA neck mass 9/30 - Self extubation during SBT 10/1 - Reintubated   SUBJECTIVE:  Bun / crt remains elevated   VITAL SIGNS: Temp:  [90.7 F (32.6 C)-99.8 F (37.7 C)] 99.7 F (37.6 C) (10/04 1100) Pulse Rate:  [104-122] 117 (10/04 1215) Resp:  [18-30] 27 (10/04 1215) BP: (86-102)/(52-70) 99/63 mmHg (10/04 1215) SpO2:  [99 %-100 %] 100 % (10/04 1215) FiO2 (%):  [40 %] 40 % (10/04  1200) Weight:  [82.7 kg (182 lb 5.1 oz)] 82.7 kg (182 lb 5.1 oz) (10/04 0600) HEMODYNAMICS: CVP:  [8 mmHg-12 mmHg] 12 mmHg VENTILATOR SETTINGS: Vent Mode:  [-] PRVC FiO2 (%):  [40 %] 40 % Set Rate:  [22 bmp] 22 bmp Vt Set:  [470 mL] 470 mL PEEP:  [5 cmH20] 5 cmH20 Plateau Pressure:  [17 cmH20-22 cmH20] 22 cmH20 INTAKE / OUTPUT:  Intake/Output Summary (Last 24 hours) at 10/03/15 1259 Last data filed at 10/03/15 1200  Gross per 24 hour  Intake 3203.24 ml  Output   2085 ml  Net 1118.24 ml    PHYSICAL EXAMINATION: General:  Sedated, rass -3 Integument: no rashes HEENT:  ETT in place, jvd noted increased  Cardiovascular:  s1 s2 rrt no m Pulmonary: increased coasre Abdomen: Soft, non tender Neurological: RASS -3  LABS:  CBC  Recent Labs Lab 10/01/15 0424 10/02/15 0438 10/03/15 0500  WBC 26.3* 23.9* 21.4*  HGB 10.4* 10.0* 9.5*  HCT 33.3* 32.1* 29.3*  PLT 54* 72* 101*   Coag's  Recent Labs Lab 09/30/15 0436 10/01/15 0424 10/02/15 0438 10/03/15 0500  APTT 31 30 30   --   INR 1.67* 1.54* 1.49 1.42   BMET  Recent Labs Lab 10/01/15 0424 10/02/15 0438 10/03/15 0500  NA 141 142 140  K 4.8 4.5 4.2  CL 90* 90* 89*  CO2 30 30 31   BUN 168* 174* 200*  CREATININE 7.11* 7.54* 7.70*  GLUCOSE 139* 144* 128*   Electrolytes  Recent Labs Lab 09/28/15 0319  09/29/15 0867 09/30/15 0436 10/01/15 0424 10/02/15 6195  10/03/15 0500  CALCIUM  --   < > 6.6* 6.7* 6.8* 7.5* 7.3*  MG 2.4  --  2.4  --  2.7*  --   --   PHOS  --   < > 6.4* 10.1* 10.6*  --  11.1*  < > = values in this interval not displayed.   Sepsis Markers  Recent Labs Lab 09/27/15 0524 09/30/15 1519 10/01/15 0424 10/02/15 0438  LATICACIDVEN 1.4  --   --   --   PROCALCITON  --  11.80 11.78 10.90   ABG  Recent Labs Lab 09/30/15 1445 09/30/15 1620 10/02/15 0420  PHART 7.539* 7.491* 7.453*  PCO2ART 31.1* 37.3 41.7  PO2ART 106* 129* 170*   Liver Enzymes  Recent Labs Lab 10/01/15 0424  10/02/15 0438 10/03/15 0500  AST 68* 70* 69*  ALT 110* 93* 81*  ALKPHOS 187* 175* 186*  BILITOT 6.4* 7.1* 6.7*  ALBUMIN 1.9* 1.8* 1.8*   Cardiac Enzymes  Recent Labs Lab 09/27/15 0152  TROPONINI 0.09*   Glucose  Recent Labs Lab 10/02/15 0348 10/02/15 0804 10/02/15 1119 10/02/15 1559 10/02/15 2347 10/03/15 0333  GLUCAP 154* 148* 145* 119* 129* 132*    Imaging Dg Chest Port 1 View  10/03/2015   CLINICAL DATA:  Healthcare associated pneumonia.  EXAM: PORTABLE CHEST 1 VIEW  COMPARISON:  10/02/2015  FINDINGS: Support lines and tubes in appropriate position. Bilateral pulmonary airspace disease shows slight improvement since previous study. Cardiomegaly stable. No pneumothorax visualized.  IMPRESSION: Stable cardiomegaly. Slight improvement in diffuse bilateral airspace disease.   Electronically Signed   By: Earle Gell M.D.   On: 10/03/2015 07:16    ASSESSMENT / PLAN:  PULMONARY ETT 9/26>>>9/30 (self extubated)>10/1  A: Acute hypoxic respiratory failure 2nd to HCAP with ARDS >> less likely pulmonary edema. Hx of tobacco abuse. 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 Shock - combination of sepsis and cardiac. P:  Hold diuresis >> likely contributing to renal failure Wean levophed to keep MAP > 60 Dobutamine per cardiology Hope levo can be dc'ed Dc propofol  RENAL A: Acute Renal Failure >> likely from CHF, sepsis, and diuresis >> creatinine rise again noted P:   D/w Dr. Jimmy Footman >>I would consider start cvvhd , given MODS, pos balance despite output good, residual int changes BUN from lymphoma? contribution and TLS F/u BMET in am   GASTROINTESTINAL A: Shock liver -  improving. Upper GIB - secondary to erosive esophagitis on EGD. Protein calorie malnutrition. P:   Tube feeds BID protonix F/u LFT's  HEMATOLOGIC A: New dx of Hodgkin's lymphoma Coagulopathy >> resolved. Anemia of critical illness, and GI bleed. Thrombocytopenia. P:  F/u CBC, INR SCDs Allopurinol per oncology Chemotherapy plans per oncology - noted  INFECTIOUS A: Septic shock 2nd to HCAP >> procalcitonin trending down. P:   Vancomycin 9/25>>>off Zosyn 9/25>>> Zovirax for prophylaxis  Sputum 10/01 >>  Add stop date zosyn  today, s/p 10 days  ENDOCRINE A: Hyperglycemia. P:   SSI if CBG > 180  NEUROLOGIC A: Acute metabolic encephalopathy. P:   RASS goal -1 Dc prop i nsetting shock fent drip, versed prn wua  rass -1 goal as above  i will  call wife   Ccm time 79 mi n  Lavon Paganini. Titus Mould, MD, [place] Pgr: [facility]

## 2015-10-03 NOTE — Progress Notes (Signed)
Subjective: Interval History: none. Sedated on vent  Objective: Vital signs in last 24 hours: Temp:  [98.2 F (36.8 C)-99.7 F (37.6 C)] 99.7 F (37.6 C) (10/04 0600) Pulse Rate:  [104-117] 115 (10/04 0600) Resp:  [18-29] 25 (10/04 0600) BP: (88-102)/(58-76) 93/62 mmHg (10/04 0600) SpO2:  [99 %-100 %] 100 % (10/04 0600) FiO2 (%):  [40 %] 40 % (10/04 0400) Weight:  [82.7 kg (182 lb 5.1 oz)] 82.7 kg (182 lb 5.1 oz) (10/04 0600) Weight change: -1.1 kg (-2 lb 6.8 oz)  Intake/Output from previous day: 10/03 0701 - 10/04 0700 In: 3043.6 [I.V.:1488.6; NG/GT:1355; IV Piggyback:200] Out: 2525 [Urine:2525] Intake/Output this shift:    General appearance: not responsive on vent Resp: rhonchi bilaterally Cardio: S1, S2 normal and reg, Gr 2/6 M GI: soft, non-tender; bowel sounds normal; no masses,  no organomegaly Extremities: edema 1+  Lab Results:  Recent Labs  10/02/15 0438 10/03/15 0500  WBC 23.9* 21.4*  HGB 10.0* 9.5*  HCT 32.1* 29.3*  PLT 72* 101*   BMET:  Recent Labs  10/02/15 0438 10/03/15 0500  NA 142 140  K 4.5 4.2  CL 90* 89*  CO2 30 31  GLUCOSE 144* 128*  BUN 174* 200*  CREATININE 7.54* 7.70*  CALCIUM 7.5* 7.3*   No results for input(s): PTH in the last 72 hours. Iron Studies: No results for input(s): IRON, TIBC, TRANSFERRIN, FERRITIN in the last 72 hours.  Studies/Results: Dg Chest Port 1 View  10/03/2015   CLINICAL DATA:  Healthcare associated pneumonia.  EXAM: PORTABLE CHEST 1 VIEW  COMPARISON:  10/02/2015  FINDINGS: Support lines and tubes in appropriate position. Bilateral pulmonary airspace disease shows slight improvement since previous study. Cardiomegaly stable. No pneumothorax visualized.  IMPRESSION: Stable cardiomegaly. Slight improvement in diffuse bilateral airspace disease.   Electronically Signed   By: Earle Gell M.D.   On: 10/03/2015 07:16   Dg Chest Port 1 View  10/02/2015   CLINICAL DATA:  Pneumonia.  EXAM: PORTABLE CHEST 1 VIEW   COMPARISON:  10/01/2015.  FINDINGS: Endotracheal tube and right IJ line in stable position. Interim placement of NG tube, its tip is below the left hemidiaphragm. Cardiomegaly with normal pulmonary vascularity. Multifocal pulmonary infiltrates are noted, improved from prior exam. No pleural effusion or pneumothorax.  IMPRESSION: 1. Interim placement of NG tube, its tip is below left hemidiaphragm. Endotracheal tube and right IJ line in stable position. 2. Interim improvement of multifocal bilateral pulmonary infiltrates. 3. Stable cardiomegaly.   Electronically Signed   By: Marcello Moores  Register   On: 10/02/2015 07:27   Dg Abd Portable 1v  10/01/2015   CLINICAL DATA:  Enteric tube placement  EXAM: PORTABLE ABDOMEN - 1 VIEW  COMPARISON:  09/27/2015 abdominal radiograph  FINDINGS: Weighted enteric tube tip is in the body of the stomach. No disproportionately dilated small bowel loops in the visualized abdomen. No evidence of pneumatosis or pneumoperitoneum. Stable 4 mm linear calcification overlying the left upper kidney, either a vascular calcification or left renal stone. Moderate degenerative changes in the visualized thoracolumbar spine. Cardiomegaly. Bibasilar lung opacities.  IMPRESSION: Weighted enteric tube tip is in the body of the stomach.   Electronically Signed   By: Ilona Sorrel M.D.   On: 10/01/2015 15:26    I have reviewed the patient's current medications.  Assessment/Plan: 1 AKI Cr and BUN rising.  High solute load.  Would benefit from lower solute from immune, metabolic, standpoint.  Nonoliguric and vol ok (CVP8-10 and mild edema, and O2  ok).  Still hypotensive.  Hopefully will turn the corner with function 2 Anemia mildly lower 3 VDRF per CCM 4 ? Pneumonia 5 Schock liver LFTs better 6 Hodgkins 7 low bps on pressors P REc CRRT , will await CCM eval.  Cont current fluids. TF, AB,  Vent.    LOS: 9 days   Paul Morton L 10/03/2015,7:24 AM

## 2015-10-03 NOTE — Progress Notes (Signed)
Advanced Heart Failure Team Rounding Note  Referring Physician: Dr Vaughan Browner Primary Physician: Primary Cardiologist:  None Oncologist: Dr Marin Olp  Reason for Consultation: Acute Systolic Heart Failure   HPI:    Paul Morton is a 54 year old male with h/o tobacco and ETOH abuse who recently developed large cervical adenopathy and intractable back pain. Underwent PET scan earlier this month which showed hypermetabolic left cervical lymphadenopathy involving levels II-IV and presence of hepatomegaly with diffuse liver hypermetabolism suggesting probably lymphoma.   Admitted 9/26 with multisystem organ failure. Echo performed and EF 10-15% with moderate RV dysfunction. Initial co-ox drawn was 55%. Started on milrinone. Intubated and transferred to [facility]. Developed large GI bleed. EGDwith esophagitis.   Underwent core biopsy of cervical node 9/27. ->  Hodgkin's lymphoma. Adcetria given 9/30 for Hodgkins.  Reintubated. Remains on dobutamine and norep. HD catheter inserted earlier today. Now on CVVHD.     Objective:    Vital Signs:   Temp:  [90.7 F (32.6 C)-99.8 F (37.7 C)] 98.5 F (36.9 C) (10/04 1304) Pulse Rate:  [104-122] 117 (10/04 1445) Resp:  [18-41] 26 (10/04 1445) BP: (86-102)/(52-70) 91/64 mmHg (10/04 1445) SpO2:  [99 %-100 %] 100 % (10/04 1445) FiO2 (%):  [40 %] 40 % (10/04 1526) Weight:  [182 lb 5.1 oz (82.7 kg)] 182 lb 5.1 oz (82.7 kg) (10/04 0600)    Weight change: Filed Weights   10/01/15 0500 10/02/15 0400 10/03/15 0600  Weight: 192 lb 0.3 oz (87.1 kg) 184 lb 11.9 oz (83.8 kg) 182 lb 5.1 oz (82.7 kg)    Intake/Output:   Intake/Output Summary (Last 24 hours) at 10/03/15 1540 Last data filed at 10/03/15 1400  Gross per 24 hour  Intake 3111.14 ml  Output   2035 ml  Net 1076.14 ml     Physical Exam: General:  Intubated/sedated HEENT: normal  Neck: supple. RIJ TLC . Prominent left neck lymphadenopathy Cor: PMI laterally displaced. regular + s3 Lungs: clear  anteriorly   Abdomen: soft, nontender, mildly distended. No bruits or masses. Good bowel sounds. Extremities: no cyanosis, clubbing, rash, no edema. R and L hand mittens Neuro: Awake following commands.   Telemetry: sinus tach. 110s.   Labs: Basic Metabolic Panel:  Recent Labs Lab 09/27/15 0152 09/27/15 0522 09/28/15 0319 09/28/15 1749 09/29/15 4496 09/30/15 0436 10/01/15 0424 10/02/15 0438 10/03/15 0500  NA 140 139  --  143 143 142 141 142 140  K 3.6 3.9  --  3.3* 3.7 4.6 4.8 4.5 4.2  CL 96* 95*  --  94* 92* 90* 90* 90* 89*  CO2 27 28  --  31 33* 31 30 30 31   GLUCOSE 174* 161*  --  164* 160* 152* 139* 144* 128*  BUN 120* 118*  --  124* 128* 156* 168* 174* 200*  CREATININE 4.94* 4.95*  --  5.34* 5.87* 6.29* 7.11* 7.54* 7.70*  CALCIUM 5.9* 5.8*  --  6.2* 6.6* 6.7* 6.8* 7.5* 7.3*  MG 2.5* 2.5* 2.4  --  2.4  --  2.7*  --   --   PHOS 6.2* 6.3*  --  6.2* 6.4* 10.1* 10.6*  --  11.1*    Liver Function Tests:  Recent Labs Lab 09/29/15 0511 09/30/15 0436 10/01/15 0424 10/02/15 0438 10/03/15 0500  AST 104* 77* 68* 70* 69*  ALT 160* 140* 110* 93* 81*  ALKPHOS 168* 193* 187* 175* 186*  BILITOT 5.9* 6.0* 6.4* 7.1* 6.7*  PROT 5.9* 5.6* 5.6* 5.5* 5.1*  ALBUMIN 2.0*  2.0* 1.9* 1.9* 1.8* 1.8*   No results for input(s): LIPASE, AMYLASE in the last 168 hours. No results for input(s): AMMONIA in the last 168 hours.  CBC:  Recent Labs Lab 09/27/15 0522 09/28/15 0319 09/29/15 0511 10/01/15 0424 10/02/15 0438 10/03/15 0500  WBC 23.0* 25.3* 27.7* 26.3* 23.9* 21.4*  NEUTROABS 21.9* 24.0* 27.1*  --   --   --   HGB 9.7* 9.6* 10.0* 10.4* 10.0* 9.5*  HCT 30.3* 30.0* 31.0* 33.3* 32.1* 29.3*  MCV 74.6* 73.9* 74.5* 76.6* 75.7* 75.1*  PLT 192 125* 74* 54* 72* 101*    Cardiac Enzymes:  Recent Labs Lab 09/27/15 0152  TROPONINI 0.09*    BNP: BNP (last 3 results) No results for input(s): BNP in the last 8760 hours.  ProBNP (last 3 results) No results for input(s):  PROBNP in the last 8760 hours.   CBG:  Recent Labs Lab 10/02/15 1119 10/02/15 1559 10/02/15 2347 10/03/15 0333 10/03/15 1303  GLUCAP 145* 119* 129* 132* 134*    Coagulation Studies:  Recent Labs  10/01/15 0424 10/02/15 0438 10/03/15 0500  LABPROT 18.5* 18.0* 17.5*  INR 1.54* 1.49 1.42     Imaging: Dg Chest Port 1 View  10/03/2015   CLINICAL DATA:  Healthcare associated pneumonia.  EXAM: PORTABLE CHEST 1 VIEW  COMPARISON:  10/02/2015  FINDINGS: Support lines and tubes in appropriate position. Bilateral pulmonary airspace disease shows slight improvement since previous study. Cardiomegaly stable. No pneumothorax visualized.  IMPRESSION: Stable cardiomegaly. Slight improvement in diffuse bilateral airspace disease.   Electronically Signed   By: Earle Gell M.D.   On: 10/03/2015 07:16   Dg Chest Port 1 View  10/02/2015   CLINICAL DATA:  Pneumonia.  EXAM: PORTABLE CHEST 1 VIEW  COMPARISON:  10/01/2015.  FINDINGS: Endotracheal tube and right IJ line in stable position. Interim placement of NG tube, its tip is below the left hemidiaphragm. Cardiomegaly with normal pulmonary vascularity. Multifocal pulmonary infiltrates are noted, improved from prior exam. No pleural effusion or pneumothorax.  IMPRESSION: 1. Interim placement of NG tube, its tip is below left hemidiaphragm. Endotracheal tube and right IJ line in stable position. 2. Interim improvement of multifocal bilateral pulmonary infiltrates. 3. Stable cardiomegaly.   Electronically Signed   By: Marcello Moores  Register   On: 10/02/2015 07:27     Medications:     Current Medications: . acyclovir  400 mg Per Tube Daily  . allopurinol  100 mg Per Tube Daily  . antiseptic oral rinse  7 mL Mouth Rinse QID  . chlorhexidine gluconate  15 mL Mouth Rinse BID  . feeding supplement (VITAL HIGH PROTEIN)  1,000 mL Per Tube Q24H  . fentaNYL (SUBLIMAZE) injection  50 mcg Intravenous Once  . lanthanum  1,000 mg Oral TID WC  . pantoprazole sodium   40 mg Per Tube BID    Infusions: . sodium chloride 10 mL/hr at 10/02/15 2000  . DOBUTamine 5 mcg/kg/min (10/03/15 1400)  . fentaNYL infusion INTRAVENOUS 50 mcg/hr (10/03/15 1445)  . heparin 10,000 units/ 20 mL infusion syringe 250 Units/hr (10/03/15 1501)  . norepinephrine (LEVOPHED) Adult infusion 4 mcg/min (10/03/15 1400)  . dialysis replacement fluid (prismasate) 800 mL/hr at 10/03/15 1459  . dialysis replacement fluid (prismasate) 700 mL/hr at 10/03/15 1500  . dialysate (PRISMASATE) 2,000 mL/hr at 10/03/15 1500     Assessment:   1. Cardiogenic shock 2. Acute systolic HF EF 97-98%, etiology uncertain.  Brother has dilated cardiomyopathy so possibly familial.  Reports drinking 6 beers/day long-term.  3. Acute respiratory failure 4. Multisystem organ failure 5. Acute kidney injury 6. Shock liver 7. Hyperkalemia 8. Cervical lymphadenopathy - path appears to be Hodgkins lymphoma. Adcetria given 9/30    --s/p core biopsy 9/27 9. Tobacco use 10. ETOH use - 4+ beers per day 11. Family h/o NICM in brother 57. Massive upper GI bleed     --severe esophagitis by EGD on 9/27 13. Coagulopathy - due to shock liver with ? DIC 14. ?CAP  Plan/Discussion:   Remains intubated.   Remains on dobutamine 5 mcg and norepi 4 mcg. Weaning Norepi as tolerated.    Starting CRRT this afternoon.     CLEGG,AMY NP-C  10/03/2015 3:40 PM Advanced Heart Failure Team Pager [phone] (M-F; 7a - 4p)  Please contact [facility] for night-coverage after hours (4p -7a ) and weekends on [email or web address]   Patient seen and examined with Darrick Grinder, NP. We discussed all aspects of the encounter. I agree with the assessment and plan as stated above.   Remains hemodynamically tenuous despite dual pressors. Now on CVVHD. Although i think cardiogenic shock was clearly his initial presentation his renal function improved a bit then deteriorated again despite adequate hemodynamic support raising the question of a  mixed etiology for his renal failure. Agree with CVVHD. Will continue inotropes to keep MAPs and cardiac output up.. Recheck co-ox to assess cardiac output. Given his lymphoma and multisystem organ failure in the setting of severely depressed cardiac function - prognosis appears poor but we will take one day at a time. Would be careful with heparin as platelets drop from chemo.   The patient is critically ill with multiple organ systems failure and requires high complexity decision making for assessment and support, frequent evaluation and titration of therapies, application of advanced monitoring technologies and extensive interpretation of multiple databases.   Critical Care Time devoted to patient care services described in this note is 35 Minutes.  Bensimhon, Daniel,MD 6:44 PM

## 2015-10-03 NOTE — Procedures (Signed)
Hemodialysis Catheter Insertion Procedure Note Paul Morton [id number] 1961-12-07  Procedure: Insertion of Hemodialysis Catheter Indications: CRRT  Procedure Details Consent: Unable to obtain consent because of altered level of consciousness. Time Out: Verified patient identification, verified procedure, site/side was marked, verified correct patient position, special equipment/implants available, medications/allergies/relevent history reviewed, required imaging and test results available.  Performed  Maximum sterile technique was used including antiseptics, cap, gloves, gown, hand hygiene, mask and sheet. Skin prep: Chlorhexidine; local anesthetic administered A antimicrobial bonded/coated triple lumen catheter was placed in the left internal jugular vein using the Seldinger technique.  Evaluation Blood flow good Complications: No apparent complications Patient did tolerate procedure well. Chest X-ray ordered to verify placement.  CXR: pending.  Procedure performed under direct ultrasound guidance for real time vessel cannulation.      Montey Hora, [facility] Pager: [phone]  or [phone] 10/03/2015, 2:43 PM   [place] Tolerated i consented wife  Lavon Paganini. Titus Mould, MD, [place] Pgr: [facility]

## 2015-10-03 NOTE — Progress Notes (Signed)
Mr. Haubner is still intubated. He is still on pressor support. His BUN and creatinine continued to go up. He is still urinating well.  The adenopathy in the left neck appears to be not as firm. It is certainly softer and not as prominent.  His platelet count is also going up. His liver enzymes are improving although his bilirubin and alkaline phosphatase are still high. His LDH is come down by almost 2/3. I have to believe that this is all in response to the Adcetris and that what was in his liver might be improving.  We still have to deal with his incredibly poor cardiac function.  It would be nice to get him on actual anticoagulation but this I think would be very difficult given the recent upper GI bleed. He is at a significant only higher risk for thromboembolic disease.  His vital signs are okay although blood pressure is still on the low side at 93/62. Heart rate is 115.   I believe that the Adcetris is doing what needs to do. Unfortunately, I'm assured how his cardiac function will be improved. I think his mortality is clearly based on his cardiac function.  I would think that at some point his kidneys will need dialysis.  I do appreciate everybody's help. I know that the staff in the ICU is doing a fantastic job with him.  Paul Morton 33:6

## 2015-10-04 ENCOUNTER — Inpatient Hospital Stay (HOSPITAL_COMMUNITY): Payer: BLUE CROSS/BLUE SHIELD

## 2015-10-04 DIAGNOSIS — N179 Acute kidney failure, unspecified: Secondary | ICD-10-CM | POA: Insufficient documentation

## 2015-10-04 LAB — CBC WITH DIFFERENTIAL/PLATELET
BASOS ABS: 0 10*3/uL (ref 0.0–0.1)
Basophils Absolute: 0 10*3/uL (ref 0.0–0.1)
Basophils Relative: 0 %
Basophils Relative: 0 %
EOS PCT: 0 %
Eosinophils Absolute: 0.1 10*3/uL (ref 0.0–0.7)
Eosinophils Absolute: 0.1 10*3/uL (ref 0.0–0.7)
Eosinophils Relative: 0 %
HCT: 28.3 % — ABNORMAL LOW (ref 39.0–52.0)
HEMATOCRIT: 29.5 % — AB (ref 39.0–52.0)
HEMOGLOBIN: 8.8 g/dL — AB (ref 13.0–17.0)
Hemoglobin: 9.6 g/dL — ABNORMAL LOW (ref 13.0–17.0)
LYMPHS PCT: 4 %
LYMPHS PCT: 4 %
Lymphs Abs: 0.6 10*3/uL — ABNORMAL LOW (ref 0.7–4.0)
Lymphs Abs: 0.7 10*3/uL (ref 0.7–4.0)
MCH: 24.2 pg — ABNORMAL LOW (ref 26.0–34.0)
MCH: 23.8 pg — ABNORMAL LOW (ref 26.0–34.0)
MCHC: 32.5 g/dL (ref 30.0–36.0)
MCHC: 31.1 g/dL (ref 30.0–36.0)
MCV: 74.3 fL — AB (ref 78.0–100.0)
MCV: 76.7 fL — AB (ref 78.0–100.0)
MONO ABS: 0.1 10*3/uL (ref 0.1–1.0)
MONOS PCT: 1 %
Monocytes Absolute: 0.3 10*3/uL (ref 0.1–1.0)
Monocytes Relative: 2 %
NEUTROS ABS: 14.9 10*3/uL — AB (ref 1.7–7.7)
NEUTROS ABS: 14.5 10*3/uL — AB (ref 1.7–7.7)
NEUTROS PCT: 94 %
Neutrophils Relative %: 95 %
PLATELETS: 147 10*3/uL — AB (ref 150–400)
Platelets: 108 10*3/uL — ABNORMAL LOW (ref 150–400)
RBC: 3.97 MIL/uL — ABNORMAL LOW (ref 4.22–5.81)
RBC: 3.69 MIL/uL — AB (ref 4.22–5.81)
RDW: 17.9 % — AB (ref 11.5–15.5)
RDW: 18.1 % — ABNORMAL HIGH (ref 11.5–15.5)
WBC: 15.7 10*3/uL — ABNORMAL HIGH (ref 4.0–10.5)
WBC: 15.5 10*3/uL — AB (ref 4.0–10.5)

## 2015-10-04 LAB — POCT ACTIVATED CLOTTING TIME
ACTIVATED CLOTTING TIME: 177 s
ACTIVATED CLOTTING TIME: 177 s
ACTIVATED CLOTTING TIME: 177 s
ACTIVATED CLOTTING TIME: 177 s
ACTIVATED CLOTTING TIME: 177 s
ACTIVATED CLOTTING TIME: 183 s
ACTIVATED CLOTTING TIME: 183 s
ACTIVATED CLOTTING TIME: 190 s
Activated Clotting Time: 165 seconds
Activated Clotting Time: 178 seconds
Activated Clotting Time: 183 seconds
Activated Clotting Time: 183 seconds
Activated Clotting Time: 183 seconds
Activated Clotting Time: 183 seconds
Activated Clotting Time: 183 seconds
Activated Clotting Time: 184 seconds
Activated Clotting Time: 190 seconds
Activated Clotting Time: 190 seconds
Activated Clotting Time: 196 seconds
Activated Clotting Time: 196 seconds

## 2015-10-04 LAB — RENAL FUNCTION PANEL
ANION GAP: 10 (ref 5–15)
Albumin: 1.9 g/dL — ABNORMAL LOW (ref 3.5–5.0)
BUN: 73 mg/dL — ABNORMAL HIGH (ref 6–20)
CALCIUM: 7.7 mg/dL — AB (ref 8.9–10.3)
CHLORIDE: 102 mmol/L (ref 101–111)
CO2: 26 mmol/L (ref 22–32)
Creatinine, Ser: 3.1 mg/dL — ABNORMAL HIGH (ref 0.61–1.24)
GFR calc non Af Amer: 21 mL/min — ABNORMAL LOW (ref >60)
GFR, EST AFRICAN AMERICAN: 24 mL/min — AB (ref >60)
GLUCOSE: 160 mg/dL — AB (ref 65–99)
POTASSIUM: 4.6 mmol/L (ref 3.5–5.1)
Phosphorus: 5.6 mg/dL — ABNORMAL HIGH (ref 2.5–4.6)
SODIUM: 138 mmol/L (ref 135–145)

## 2015-10-04 LAB — BILIRUBIN, FRACTIONATED(TOT/DIR/INDIR)
BILIRUBIN DIRECT: 5.4 mg/dL — AB (ref 0.1–0.5)
BILIRUBIN INDIRECT: 2.3 mg/dL — AB (ref 0.3–0.9)
BILIRUBIN TOTAL: 7.7 mg/dL — AB (ref 0.3–1.2)

## 2015-10-04 LAB — COMPREHENSIVE METABOLIC PANEL
ALBUMIN: 1.9 g/dL — AB (ref 3.5–5.0)
ALT: 85 U/L — ABNORMAL HIGH (ref 17–63)
ANION GAP: 13 (ref 5–15)
AST: 66 U/L — ABNORMAL HIGH (ref 15–41)
Alkaline Phosphatase: 186 U/L — ABNORMAL HIGH (ref 38–126)
BUN: 87 mg/dL — ABNORMAL HIGH (ref 6–20)
CO2: 29 mmol/L (ref 22–32)
Calcium: 7.9 mg/dL — ABNORMAL LOW (ref 8.9–10.3)
Chloride: 98 mmol/L — ABNORMAL LOW (ref 101–111)
Creatinine, Ser: 3.66 mg/dL — ABNORMAL HIGH (ref 0.61–1.24)
GFR calc Af Amer: 20 mL/min — ABNORMAL LOW (ref >60)
GFR calc non Af Amer: 17 mL/min — ABNORMAL LOW (ref >60)
Glucose, Bld: 105 mg/dL — ABNORMAL HIGH (ref 65–99)
POTASSIUM: 4.3 mmol/L (ref 3.5–5.1)
SODIUM: 140 mmol/L (ref 135–145)
Total Bilirubin: 7.3 mg/dL — ABNORMAL HIGH (ref 0.3–1.2)
Total Protein: 5.7 g/dL — ABNORMAL LOW (ref 6.5–8.1)

## 2015-10-04 LAB — CARBOXYHEMOGLOBIN
Carboxyhemoglobin: 1.6 % — ABNORMAL HIGH (ref 0.5–1.5)
Methemoglobin: 1.1 % (ref 0.0–1.5)
O2 SAT: 57.5 %
TOTAL HEMOGLOBIN: 10.8 g/dL — AB (ref 13.5–18.0)

## 2015-10-04 LAB — APTT: aPTT: 40 seconds — ABNORMAL HIGH (ref 24–37)

## 2015-10-04 LAB — GLUCOSE, CAPILLARY
GLUCOSE-CAPILLARY: 126 mg/dL — AB (ref 65–99)
Glucose-Capillary: 102 mg/dL — ABNORMAL HIGH (ref 65–99)
Glucose-Capillary: 100 mg/dL — ABNORMAL HIGH (ref 65–99)
Glucose-Capillary: 162 mg/dL — ABNORMAL HIGH (ref 65–99)
Glucose-Capillary: 171 mg/dL — ABNORMAL HIGH (ref 65–99)

## 2015-10-04 LAB — PHOSPHORUS: Phosphorus: 6.3 mg/dL — ABNORMAL HIGH (ref 2.5–4.6)

## 2015-10-04 LAB — MAGNESIUM: Magnesium: 2.5 mg/dL — ABNORMAL HIGH (ref 1.7–2.4)

## 2015-10-04 MED ORDER — PANTOPRAZOLE SODIUM 40 MG IV SOLR
40.0000 mg | Freq: Every day | INTRAVENOUS | Status: DC
  Administered 2015-10-04: 40 mg via INTRAVENOUS
  Filled 2015-10-04 (×2): qty 40

## 2015-10-04 MED ORDER — ETOMIDATE 2 MG/ML IV SOLN
INTRAVENOUS | Status: AC
  Administered 2015-10-04: 20 mg
  Filled 2015-10-04: qty 10

## 2015-10-04 MED ORDER — SEVELAMER CARBONATE 2.4 G PO PACK
2.4000 g | PACK | Freq: Three times a day (TID) | ORAL | Status: DC
  Administered 2015-10-04 (×2): 2.4 g
  Filled 2015-10-04 (×7): qty 1

## 2015-10-04 MED ORDER — NOREPINEPHRINE BITARTRATE 1 MG/ML IV SOLN
0.0000 ug/min | INTRAVENOUS | Status: DC
  Administered 2015-10-04: 12 ug/min via INTRAVENOUS
  Administered 2015-10-05: 10 ug/min via INTRAVENOUS
  Administered 2015-10-09: 15 ug/min via INTRAVENOUS
  Administered 2015-10-09: 7.04 ug/min via INTRAVENOUS
  Administered 2015-10-09: 10 ug/min via INTRAVENOUS
  Administered 2015-10-10: 15 ug/min via INTRAVENOUS
  Administered 2015-10-11: 40 ug/min via INTRAVENOUS
  Administered 2015-10-11: 35 ug/min via INTRAVENOUS
  Administered 2015-10-11: 40 ug/min via INTRAVENOUS
  Administered 2015-10-12 (×2): 50 ug/min via INTRAVENOUS
  Administered 2015-10-12: 40 ug/min via INTRAVENOUS
  Administered 2015-10-13 (×2): 55 ug/min via INTRAVENOUS
  Filled 2015-10-04 (×15): qty 16

## 2015-10-04 NOTE — Progress Notes (Signed)
Subjective: Interval History: has no complaint of coop, follows commands.  Objective: Vital signs in last 24 hours: Temp:  [90.7 F (32.6 C)-99.8 F (37.7 C)] 96.8 F (36 C) (10/05 0600) Pulse Rate:  [86-125] 111 (10/05 0700) Resp:  [13-41] 16 (10/05 0700) BP: (78-104)/(52-72) 84/64 mmHg (10/05 0700) SpO2:  [99 %-100 %] 100 % (10/05 0700) FiO2 (%):  [40 %] 40 % (10/05 0600) Weight:  [82.9 kg (182 lb 12.2 oz)] 82.9 kg (182 lb 12.2 oz) (10/05 0415) Weight change: 0.2 kg (7.1 oz)  Intake/Output from previous day: 10/04 0701 - 10/05 0700 In: 2958.6 [I.V.:1293.6; AC/ZY:6063; IV Piggyback:50] Out: 0160 [Urine:1190] Intake/Output this shift:    General appearance: cooperative, slowed mentation and on vent,  Neck: L IJ cath for HD Resp: rales bibasilar Cardio: S1, S2 normal and systolic murmur: holosystolic 2/6, blowing at apex GI: pos bs, mild distension Extremities: edema 2+  Lab Results:  Recent Labs  10/03/15 0500 10/04/15 0415  WBC 21.4* 15.7*  HGB 9.5* 9.6*  HCT 29.3* 29.5*  PLT 101* 108*   BMET:  Recent Labs  10/03/15 1458 10/04/15 0415  NA 140 140  K 4.3 4.3  CL 87* 98*  CO2 29 29  GLUCOSE 118* 105*  BUN 190* 87*  CREATININE 7.65* 3.66*  CALCIUM 7.2* 7.9*   No results for input(s): PTH in the last 72 hours. Iron Studies: No results for input(s): IRON, TIBC, TRANSFERRIN, FERRITIN in the last 72 hours.  Studies/Results: Dg Chest Port 1 View  10/04/2015   CLINICAL DATA:  ARDS  EXAM: PORTABLE CHEST 1 VIEW  COMPARISON:  10/03/2015  FINDINGS: Endotracheal tube in good position. Right jugular catheter has been advanced with tip in the lower SVC. Left jugular central venous catheter tip in the SVC unchanged. Feeding tube enters the stomach with the tip not visualized.  No pneumothorax  Progression of bibasilar airspace opacities. This may represent atelectasis or pneumonia.  Cardiac enlargement with vascular congestion. Possible small right effusion  IMPRESSION:  Progression of bibasilar atelectasis/infiltrate. Possible small right effusion.   Electronically Signed   By: Franchot Gallo M.D.   On: 10/04/2015 07:05   Dg Chest Port 1 View  10/03/2015   CLINICAL DATA:  Central line placement.  EXAM: PORTABLE CHEST 1 VIEW  COMPARISON:  Film previously performed today at 0506 hours  FINDINGS: New larger caliber left jugular central line has been placed with the tip in the upper SVC. No pneumothorax after placement. Endotracheal tube remains with the tip 3.4 cm above the carina. Right jugular central line shows stable positioning. Lungs show suggestion of mild interstitial edema which is relatively stable. No pleural effusions. Stable cardiac enlargement.  IMPRESSION: Newly placed left jugular central line tip is in the upper SVC. No pneumothorax identified.   Electronically Signed   By: Aletta Edouard M.D.   On: 10/03/2015 16:10   Dg Chest Port 1 View  10/03/2015   CLINICAL DATA:  Healthcare associated pneumonia.  EXAM: PORTABLE CHEST 1 VIEW  COMPARISON:  10/02/2015  FINDINGS: Support lines and tubes in appropriate position. Bilateral pulmonary airspace disease shows slight improvement since previous study. Cardiomegaly stable. No pneumothorax visualized.  IMPRESSION: Stable cardiomegaly. Slight improvement in diffuse bilateral airspace disease.   Electronically Signed   By: Earle Gell M.D.   On: 10/03/2015 07:16    I have reviewed the patient's current medications.  Assessment/Plan: 1 AKI solute load much improved with CRRT. Acid/base/K stable.  Mild vol xs but with low bps,keep  even .  CVP10. 2 Hypotension still an issue on pressors, cause ?? 3 VDRF per CCM 4 Hodgkins on meds 5 Pneu resolving  CXR better 6 Schock liver 7Anemia stable P CRRT, Vent, TF, AB    LOS: 10 days   Domenic Schoenberger L 10/04/2015,7:10 AM

## 2015-10-04 NOTE — Procedures (Signed)
Central Venous Catheter Insertion Procedure Note Lorenz Donley [id number] 10-08-1961  Procedure: Insertion of Central Venous Catheter Indications: cvvhd  Procedure Details Consent: Risks of procedure as well as the alternatives and risks of each were explained to the (patient/caregiver).  Consent for procedure obtained. Time Out: Verified patient identification, verified procedure, site/side was marked, verified correct patient position, special equipment/implants available, medications/allergies/relevent history reviewed, required imaging and test results available.  Performed  Maximum sterile technique was used including antiseptics, cap, gloves, gown, hand hygiene, mask and sheet. Skin prep: Chlorhexidine; local anesthetic administered A antimicrobial bonded/coated triple lumen catheter was placed in the left internal jugular vein using the Seldinger technique.  Evaluation Blood flow good Complications: No apparent complications Patient did tolerate procedure well. Chest X-ray ordered to verify placement.  CXR: pending.  Raylene Miyamoto 10/04/2015, 1:02 PM  [place]  Daniel J. Titus Mould, MD, [place] Pgr: [facility]

## 2015-10-04 NOTE — Progress Notes (Signed)
Advanced Heart Failure Team Rounding Note  Subjective:  Paul Morton is a 54 year old male with h/o tobacco and ETOH abuse who recently developed large cervical adenopathy and intractable back pain. Underwent PET scan earlier this month which showed hypermetabolic left cervical lymphadenopathy involving levels II-IV and presence of hepatomegaly with diffuse liver hypermetabolism suggesting probably lymphoma.   Admitted 9/26 with multisystem organ failure. Echo performed and EF 10-15% with moderate RV dysfunction. Initial co-ox drawn was 55%. Started on milrinone. Intubated and transferred to [facility]. Developed large GI bleed. EGDwith esophagitis.   Underwent core biopsy of cervical node 9/27. ->  Hodgkin's lymphoma. Adcetria given 9/30 for Hodgkins.  Remains intubated.  On CVVHD and dobutamine and norepi. Awakens and follows commands.Plan for trach later this week. Co-ox 58%    Objective:    Vital Signs:   Temp:  [90.7 F (32.6 C)-99.8 F (37.7 C)] 98.4 F (36.9 C) (10/05 0759) Pulse Rate:  [86-125] 118 (10/05 0930) Resp:  [11-41] 17 (10/05 0930) BP: (78-104)/(52-72) 86/64 mmHg (10/05 0930) SpO2:  [99 %-100 %] 99 % (10/05 0930) FiO2 (%):  [40 %] 40 % (10/05 0831) Weight:  [82.9 kg (182 lb 12.2 oz)] 82.9 kg (182 lb 12.2 oz) (10/05 0415) Last BM Date: 10/03/15  Weight change: Filed Weights   10/02/15 0400 10/03/15 0600 10/04/15 0415  Weight: 83.8 kg (184 lb 11.9 oz) 82.7 kg (182 lb 5.1 oz) 82.9 kg (182 lb 12.2 oz)    Intake/Output:   Intake/Output Summary (Last 24 hours) at 10/04/15 0947 Last data filed at 10/04/15 0900  Gross per 24 hour  Intake 2929.15 ml  Output   3363 ml  Net -433.85 ml     Physical Exam: CVP 12 General:  Intubated awake on vent HEENT: normal  Neck: supple. RIJ TLC . LIJ trialysis cath Prominent left neck lymphadenopathy Cor: PMI laterally displaced. regular + s3 Lungs: clear anteriorly   Abdomen: soft, nontender, nondistended. No bruits or masses.  Good bowel sounds. Extremities: no cyanosis, clubbing, rash, no edema. R and L hand mittens Neuro: Awake following commands.   Telemetry: sinus tach. 110s.   Labs: Basic Metabolic Panel:  Recent Labs Lab 09/28/15 0319  09/29/15 0511 09/30/15 0436 10/01/15 0424 10/02/15 0438 10/03/15 0500 10/03/15 1458 10/04/15 0415  NA  --   < > 143 142 141 142 140 140 140  K  --   < > 3.7 4.6 4.8 4.5 4.2 4.3 4.3  CL  --   < > 92* 90* 90* 90* 89* 87* 98*  CO2  --   < > 33* 31 30 30 31 29 29   GLUCOSE  --   < > 160* 152* 139* 144* 128* 118* 105*  BUN  --   < > 128* 156* 168* 174* 200* 190* 87*  CREATININE  --   < > 5.87* 6.29* 7.11* 7.54* 7.70* 7.65* 3.66*  CALCIUM  --   < > 6.6* 6.7* 6.8* 7.5* 7.3* 7.2* 7.9*  MG 2.4  --  2.4  --  2.7*  --   --   --  2.5*  PHOS  --   < > 6.4* 10.1* 10.6*  --  11.1* 11.3* 6.3*  < > = values in this interval not displayed.  Liver Function Tests:  Recent Labs Lab 09/30/15 0436 10/01/15 0424 10/02/15 0438 10/03/15 0500 10/03/15 1458 10/04/15 0415  AST 77* 68* 70* 69*  --  66*  ALT 140* 110* 93* 81*  --  85*  ALKPHOS 193* 187* 175* 186*  --  186*  BILITOT 6.0* 6.4* 7.1* 6.7*  --  7.3*  PROT 5.6* 5.6* 5.5* 5.1*  --  5.7*  ALBUMIN 1.9* 1.9* 1.8* 1.8* 1.8* 1.9*   No results for input(s): LIPASE, AMYLASE in the last 168 hours. No results for input(s): AMMONIA in the last 168 hours.  CBC:  Recent Labs Lab 09/28/15 0319 09/29/15 0511 10/01/15 0424 10/02/15 0438 10/03/15 0500 10/04/15 0415  WBC 25.3* 27.7* 26.3* 23.9* 21.4* 15.7*  NEUTROABS 24.0* 27.1*  --   --   --  14.9*  HGB 9.6* 10.0* 10.4* 10.0* 9.5* 9.6*  HCT 30.0* 31.0* 33.3* 32.1* 29.3* 29.5*  MCV 73.9* 74.5* 76.6* 75.7* 75.1* 74.3*  PLT 125* 74* 54* 72* 101* 108*    Cardiac Enzymes: No results for input(s): CKTOTAL, CKMB, CKMBINDEX, TROPONINI in the last 168 hours.  BNP: BNP (last 3 results) No results for input(s): BNP in the last 8760 hours.  ProBNP (last 3 results) No results  for input(s): PROBNP in the last 8760 hours.   CBG:  Recent Labs Lab 10/03/15 0333 10/03/15 1303 10/04/15 0002 10/04/15 0409 10/04/15 0758  GLUCAP 132* 134* 126* 102* 100*    Coagulation Studies:  Recent Labs  10/02/15 0438 10/03/15 0500  LABPROT 18.0* 17.5*  INR 1.49 1.42     Imaging: Dg Chest Port 1 View  10/04/2015   CLINICAL DATA:  ARDS  EXAM: PORTABLE CHEST 1 VIEW  COMPARISON:  10/03/2015  FINDINGS: Endotracheal tube in good position. Right jugular catheter has been advanced with tip in the lower SVC. Left jugular central venous catheter tip in the SVC unchanged. Feeding tube enters the stomach with the tip not visualized.  No pneumothorax  Progression of bibasilar airspace opacities. This may represent atelectasis or pneumonia.  Cardiac enlargement with vascular congestion. Possible small right effusion  IMPRESSION: Progression of bibasilar atelectasis/infiltrate. Possible small right effusion.   Electronically Signed   By: Franchot Gallo M.D.   On: 10/04/2015 07:05   Dg Chest Port 1 View  10/03/2015   CLINICAL DATA:  Central line placement.  EXAM: PORTABLE CHEST 1 VIEW  COMPARISON:  Film previously performed today at 0506 hours  FINDINGS: New larger caliber left jugular central line has been placed with the tip in the upper SVC. No pneumothorax after placement. Endotracheal tube remains with the tip 3.4 cm above the carina. Right jugular central line shows stable positioning. Lungs show suggestion of mild interstitial edema which is relatively stable. No pleural effusions. Stable cardiac enlargement.  IMPRESSION: Newly placed left jugular central line tip is in the upper SVC. No pneumothorax identified.   Electronically Signed   By: Aletta Edouard M.D.   On: 10/03/2015 16:10   Dg Chest Port 1 View  10/03/2015   CLINICAL DATA:  Healthcare associated pneumonia.  EXAM: PORTABLE CHEST 1 VIEW  COMPARISON:  10/02/2015  FINDINGS: Support lines and tubes in appropriate position.  Bilateral pulmonary airspace disease shows slight improvement since previous study. Cardiomegaly stable. No pneumothorax visualized.  IMPRESSION: Stable cardiomegaly. Slight improvement in diffuse bilateral airspace disease.   Electronically Signed   By: Earle Gell M.D.   On: 10/03/2015 07:16     Medications:     Current Medications: . acyclovir  400 mg Per Tube Daily  . allopurinol  100 mg Per Tube Daily  . antiseptic oral rinse  7 mL Mouth Rinse QID  . chlorhexidine gluconate  15 mL Mouth Rinse BID  . feeding  supplement (VITAL HIGH PROTEIN)  1,000 mL Per Tube Q24H  . fentaNYL (SUBLIMAZE) injection  50 mcg Intravenous Once  . pantoprazole sodium  40 mg Per Tube BID  . sevelamer carbonate  2.4 g Per Tube TID WC    Infusions: . sodium chloride 10 mL/hr at 10/02/15 2000  . DOBUTamine 5 mcg/kg/min (10/04/15 0900)  . fentaNYL infusion INTRAVENOUS 25 mcg/hr (10/04/15 0900)  . heparin 10,000 units/ 20 mL infusion syringe 1,300 Units/hr (10/04/15 0913)  . norepinephrine (LEVOPHED) Adult infusion 10 mcg/min (10/04/15 0933)  . dialysis replacement fluid (prismasate) 800 mL/hr at 10/04/15 0423  . dialysis replacement fluid (prismasate) 700 mL/hr at 10/04/15 0609  . dialysate (PRISMASATE) 2,000 mL/hr at 10/04/15 0945     Assessment:   1. Cardiogenic shock 2. Acute systolic HF EF 28-78%, etiology uncertain.  Brother has dilated cardiomyopathy so possibly familial.  Reports drinking 6 beers/day long-term.  3. Acute respiratory failure 4. Multisystem organ failure 5. Acute kidney injury 6. Shock liver 7. Hyperkalemia 8. Cervical lymphadenopathy - path appears to be Hodgkins lymphoma. Adcetria given 9/30    --s/p core biopsy 9/27 9. Tobacco use 10. ETOH use - 4+ beers per day 11. Family h/o NICM in brother 19. Massive upper GI bleed     --severe esophagitis by EGD on 9/27 13. Coagulopathy - due to shock liver with ? DIC 14. ?CAP  Plan/Discussion:     Remains hemodynamically  tenuous despite dual pressors. Co-ox 58% is adequate. Will continue inotropes to keep MAPs and cardiac output up. Now on CVVHD which hopefully should help. Keep CVP 10-12. Will plan repeat echo early next week. Continue supportive care as the oncology team treats his malignancy. Prognosis very guarded but we will continue to take one day at a time and hopefully he will begin to get some cardiac recovery.   D/w Dr. Titus Mould and CCM team at bedside.   The patient is critically ill with multiple organ systems failure and requires high complexity decision making for assessment and support, frequent evaluation and titration of therapies, application of advanced monitoring technologies and extensive interpretation of multiple databases.   Critical Care Time devoted to patient care services described in this note is 35 Minutes.  Paul Morton, Daniel,MD 9:47 AM Advanced Heart Failure Team Pager [phone] (M-F; 7a - 4p)  Please contact [facility] for night-coverage after hours (4p -7a ) and weekends on [email or web address]

## 2015-10-04 NOTE — Progress Notes (Signed)
ETT Holder replaced due to blood on strap.  ETT resecured at 25 at the lip and is secure.

## 2015-10-04 NOTE — Progress Notes (Signed)
PULMONARY / CRITICAL CARE MEDICINE   Name: Morey Andonian MRN: [id number] DOB: 04/06/1961    ADMISSION DATE:  09/17/2015  REFERRING MD :  EDP  CHIEF COMPLAINT:  Sepsis   INITIAL PRESENTATION:  54yo male smoker with recent diagnosis lymphoma (still in early stages of workup) who presented 9/25 with progressive back pain.  In ER found to be tachycardic, hypotensive with lactate 14, AKI and coagulopathy.  PCCM called to admit.    STUDIES:  PET 9/22 - Bulky confluent hypermetabolic left cervical lymphadenopathy involving levels II-V. Given the presence of hepatomegaly with diffuse liver hypermetabolism, a diagnosis of lymphoma is favored, however metastatic carcinoma remains on the differential.  Hypermetabolic patchy ground-glass opacity and consolidation in both upper lobes and right lower lobe, favor an infectious or inflammatory etiology.  TTE 9/26 - EF 10-15% w/o regional wall motion abnormality. Grade 3 diastolic dysfunction. Moderate AR. Moderate MR. RV severely dilated w/ moderate reduction in systolic function. PASP 43 mmHg. No pericardial effusion.  RLE Venous Duplex 9/26 - no DVT or SVT  MRI spine 9/25 - incomplete exam, No evidence of significant spinal stenosis or cord compression in the cervical or visualized thoracic spine.   MRI L-spine 9/27 - suboptimal exam. No malignant or suspicious signal.  SIGNIFICANT EVENTS: 9/25 - Admitted to hospital 9/25 - Right IJ CVC by ED 9/26 - Intubated for pending respiratory failure & transferred to [facility] 9/27 - EGD w/ erosive esophagitis 9/27 - FNA neck mass 9/30 - Self extubation during SBT 10/1 - Reintubated  10/4 cvvhd started  SUBJECTIVE:  Just pulled out hd cath on cvvhd   VITAL SIGNS: Temp:  [95.3 F (35.2 C)-98.6 F (37 C)] 98.6 F (37 C) (10/05 1158) Pulse Rate:  [56-125] 56 (10/05 1045) Resp:  [11-41] 16 (10/05 1045) BP: (78-104)/(55-72) 93/61 mmHg (10/05 1045) SpO2:  [78 %-100 %] 78 % (10/05 1045) FiO2 (%):  [40 %]  40 % (10/05 1100) Weight:  [82.9 kg (182 lb 12.2 oz)] 82.9 kg (182 lb 12.2 oz) (10/05 0415) HEMODYNAMICS: CVP:  [10 mmHg-13 mmHg] 11 mmHg VENTILATOR SETTINGS: Vent Mode:  [-] PSV;CPAP FiO2 (%):  [40 %] 40 % Set Rate:  [16 bmp] 16 bmp Vt Set:  [470 mL] 470 mL PEEP:  [5 cmH20] 5 cmH20 Pressure Support:  [5 cmH20] 5 cmH20 Plateau Pressure:  [7 cmH20-32 cmH20] 7 cmH20 INTAKE / OUTPUT:  Intake/Output Summary (Last 24 hours) at 10/04/15 1206 Last data filed at 10/04/15 1000  Gross per 24 hour  Intake 2667.05 ml  Output   3174 ml  Net -506.95 ml    PHYSICAL EXAMINATION: General:  rass 0, much more awake Integument: no rashes HEENT:  ETT in place, jvd noted increased  Cardiovascular:  s1 s2 rrt no m Pulmonary: increased coasre, crackles Abdomen: Soft, non tender Neurological: RASS 0  LABS:  CBC  Recent Labs Lab 10/02/15 0438 10/03/15 0500 10/04/15 0415  WBC 23.9* 21.4* 15.7*  HGB 10.0* 9.5* 9.6*  HCT 32.1* 29.3* 29.5*  PLT 72* 101* 108*   Coag's  Recent Labs Lab 10/01/15 0424 10/02/15 0438 10/03/15 0500 10/04/15 0415  APTT 30 30  --  40*  INR 1.54* 1.49 1.42  --    BMET  Recent Labs Lab 10/03/15 0500 10/03/15 1458 10/04/15 0415  NA 140 140 140  K 4.2 4.3 4.3  CL 89* 87* 98*  CO2 31 29 29   BUN 200* 190* 87*  CREATININE 7.70* 7.65* 3.66*  GLUCOSE 128* 118* 105*  Electrolytes  Recent Labs Lab 09/29/15 0511  10/01/15 0424  10/03/15 0500 10/03/15 1458 10/04/15 0415  CALCIUM 6.6*  < > 6.8*  < > 7.3* 7.2* 7.9*  MG 2.4  --  2.7*  --   --   --  2.5*  PHOS 6.4*  < > 10.6*  --  11.1* 11.3* 6.3*  < > = values in this interval not displayed.   Sepsis Markers  Recent Labs Lab 09/30/15 1519 10/01/15 0424 10/02/15 0438  PROCALCITON 11.80 11.78 10.90   ABG  Recent Labs Lab 09/30/15 1445 09/30/15 1620 10/02/15 0420  PHART 7.539* 7.491* 7.453*  PCO2ART 31.1* 37.3 41.7  PO2ART 106* 129* 170*   Liver Enzymes  Recent Labs Lab  10/02/15 0438 10/03/15 0500 10/03/15 1458 10/04/15 0415  AST 70* 69*  --  66*  ALT 93* 81*  --  85*  ALKPHOS 175* 186*  --  186*  BILITOT 7.1* 6.7*  --  7.3*  ALBUMIN 1.8* 1.8* 1.8* 1.9*   Cardiac Enzymes No results for input(s): TROPONINI, PROBNP in the last 168 hours. Glucose  Recent Labs Lab 10/03/15 0333 10/03/15 1303 10/04/15 0002 10/04/15 0409 10/04/15 0758 10/04/15 1143  GLUCAP 132* 134* 126* 102* 100* 162*    Imaging Dg Chest Port 1 View  10/04/2015   CLINICAL DATA:  ARDS  EXAM: PORTABLE CHEST 1 VIEW  COMPARISON:  10/03/2015  FINDINGS: Endotracheal tube in good position. Right jugular catheter has been advanced with tip in the lower SVC. Left jugular central venous catheter tip in the SVC unchanged. Feeding tube enters the stomach with the tip not visualized.  No pneumothorax  Progression of bibasilar airspace opacities. This may represent atelectasis or pneumonia.  Cardiac enlargement with vascular congestion. Possible small right effusion  IMPRESSION: Progression of bibasilar atelectasis/infiltrate. Possible small right effusion.   Electronically Signed   By: Franchot Gallo M.D.   On: 10/04/2015 07:05   Dg Chest Port 1 View  10/03/2015   CLINICAL DATA:  Central line placement.  EXAM: PORTABLE CHEST 1 VIEW  COMPARISON:  Film previously performed today at 0506 hours  FINDINGS: New larger caliber left jugular central line has been placed with the tip in the upper SVC. No pneumothorax after placement. Endotracheal tube remains with the tip 3.4 cm above the carina. Right jugular central line shows stable positioning. Lungs show suggestion of mild interstitial edema which is relatively stable. No pleural effusions. Stable cardiac enlargement.  IMPRESSION: Newly placed left jugular central line tip is in the upper SVC. No pneumothorax identified.   Electronically Signed   By: Aletta Edouard M.D.   On: 10/03/2015 16:10    ASSESSMENT / PLAN:  PULMONARY ETT 9/26>>>9/30 (self  extubated)>10/1  A: Acute hypoxic respiratory failure 2nd to HCAP with ARDS >> less likely pulmonary edema. Hx of tobacco abuse. 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 Shock - combination of sepsis and cardiac. P:  Wean levophed to keep MAP > 60 Dobutamine per cardiology, to remain Levophed at 10 No add vasopressin May need repeat svo2  RENAL A: Acute Renal Failure >> likely from CHF, sepsis, and diuresis >> creatinine rise again noted P:   cvvhd restart needed  Chem in am  Neg balance goals Will support MAp with levophed  GASTROINTESTINAL A: Shock liver - improving. Upper GIB - secondary to erosive esophagitis on EGD. Protein calorie malnutrition. P:   Tube feeds BID protonix Indirect bili , direct assessment May need [place]   HEMATOLOGIC A: New dx of Hodgkin's lymphoma Coagulopathy >> resolved. Anemia of critical illness, and GI bleed. Thrombocytopenia. P:  F/u CBC, INR SCDs Allopurinol per oncology Chemotherapy plans per oncology - noted  INFECTIOUS A: Septic shock 2nd to HCAP >> procalcitonin trending down. P:   Vancomycin 9/25>>>off Zosyn 9/25>>>full course completed Zovirax for prophylaxis  Sputum 10/01 >>candida   ENDOCRINE A: Hyperglycemia. P:   SSI if CBG > 180  NEUROLOGIC A: Acute metabolic encephalopathy. P:   RASS goal 0 fent improved with this versed prn wua  rass 0 goal  Updated wife in room   Ccm time 100 mi n  Lavon Paganini. Titus Mould, MD, [place] Pgr: [facility]

## 2015-10-04 NOTE — Progress Notes (Signed)
eLink Physician-Brief Progress Note Patient Name: Emin Foree DOB: [birth year] MRN: [id number]   Date of Service  10/04/2015  HPI/Events of Note  Maroon emesis.   eICU Interventions  Will order: 1. Change Protonix from PO to IV.  2. CBC now.      Intervention Category Intermediate Interventions: Other:  Lily Velasquez Cornelia Copa 10/04/2015, 10:19 PM

## 2015-10-05 ENCOUNTER — Inpatient Hospital Stay (HOSPITAL_COMMUNITY): Payer: BLUE CROSS/BLUE SHIELD

## 2015-10-05 LAB — BLOOD GAS, VENOUS

## 2015-10-05 LAB — RENAL FUNCTION PANEL
ANION GAP: 12 (ref 5–15)
Albumin: 1.9 g/dL — ABNORMAL LOW (ref 3.5–5.0)
Albumin: 2 g/dL — ABNORMAL LOW (ref 3.5–5.0)
Anion gap: 10 (ref 5–15)
BUN: 41 mg/dL — ABNORMAL HIGH (ref 6–20)
BUN: 34 mg/dL — AB (ref 6–20)
CALCIUM: 8 mg/dL — AB (ref 8.9–10.3)
CALCIUM: 8.3 mg/dL — AB (ref 8.9–10.3)
CHLORIDE: 101 mmol/L (ref 101–111)
CO2: 28 mmol/L (ref 22–32)
CO2: 28 mmol/L (ref 22–32)
CREATININE: 1.58 mg/dL — AB (ref 0.61–1.24)
Chloride: 96 mmol/L — ABNORMAL LOW (ref 101–111)
Creatinine, Ser: 1.94 mg/dL — ABNORMAL HIGH (ref 0.61–1.24)
GFR calc Af Amer: 55 mL/min — ABNORMAL LOW (ref >60)
GFR, EST AFRICAN AMERICAN: 43 mL/min — AB (ref >60)
GFR, EST NON AFRICAN AMERICAN: 37 mL/min — AB (ref >60)
GFR, EST NON AFRICAN AMERICAN: 48 mL/min — AB (ref >60)
Glucose, Bld: 120 mg/dL — ABNORMAL HIGH (ref 65–99)
Glucose, Bld: 109 mg/dL — ABNORMAL HIGH (ref 65–99)
POTASSIUM: 4.4 mmol/L (ref 3.5–5.1)
Phosphorus: 3.9 mg/dL (ref 2.5–4.6)
Phosphorus: 3.4 mg/dL (ref 2.5–4.6)
Potassium: 4.6 mmol/L (ref 3.5–5.1)
SODIUM: 136 mmol/L (ref 135–145)
SODIUM: 139 mmol/L (ref 135–145)

## 2015-10-05 LAB — MAGNESIUM: Magnesium: 2.6 mg/dL — ABNORMAL HIGH (ref 1.7–2.4)

## 2015-10-05 LAB — APTT
aPTT: 61 seconds — ABNORMAL HIGH (ref 24–37)
aPTT: 44 seconds — ABNORMAL HIGH (ref 24–37)

## 2015-10-05 LAB — POCT ACTIVATED CLOTTING TIME
ACTIVATED CLOTTING TIME: 183 s
ACTIVATED CLOTTING TIME: 190 s
ACTIVATED CLOTTING TIME: 184 s
ACTIVATED CLOTTING TIME: 177 s
ACTIVATED CLOTTING TIME: 183 s
ACTIVATED CLOTTING TIME: 183 s
ACTIVATED CLOTTING TIME: 147 s
ACTIVATED CLOTTING TIME: 190 s
ACTIVATED CLOTTING TIME: 189 s
ACTIVATED CLOTTING TIME: 190 s
ACTIVATED CLOTTING TIME: 196 s
ACTIVATED CLOTTING TIME: 202 s
Activated Clotting Time: 171 seconds
Activated Clotting Time: 177 seconds
Activated Clotting Time: 183 seconds
Activated Clotting Time: 183 seconds
Activated Clotting Time: 183 seconds
Activated Clotting Time: 183 seconds
Activated Clotting Time: 183 seconds

## 2015-10-05 LAB — GLUCOSE, CAPILLARY
GLUCOSE-CAPILLARY: 104 mg/dL — AB (ref 65–99)
GLUCOSE-CAPILLARY: 101 mg/dL — AB (ref 65–99)
Glucose-Capillary: 127 mg/dL — ABNORMAL HIGH (ref 65–99)
Glucose-Capillary: 95 mg/dL (ref 65–99)

## 2015-10-05 LAB — CBC
HEMATOCRIT: 27.6 % — AB (ref 39.0–52.0)
HEMOGLOBIN: 8.7 g/dL — AB (ref 13.0–17.0)
MCH: 23.8 pg — ABNORMAL LOW (ref 26.0–34.0)
MCHC: 31.5 g/dL (ref 30.0–36.0)
MCV: 75.4 fL — ABNORMAL LOW (ref 78.0–100.0)
Platelets: 140 10*3/uL — ABNORMAL LOW (ref 150–400)
RBC: 3.66 MIL/uL — AB (ref 4.22–5.81)
RDW: 18.1 % — ABNORMAL HIGH (ref 11.5–15.5)
WBC: 12.8 10*3/uL — AB (ref 4.0–10.5)

## 2015-10-05 LAB — CARBOXYHEMOGLOBIN
CARBOXYHEMOGLOBIN: 1.9 % — AB (ref 0.5–1.5)
METHEMOGLOBIN: 1.1 % (ref 0.0–1.5)
O2 Saturation: 54 %
Total hemoglobin: 8.9 g/dL — ABNORMAL LOW (ref 13.5–18.0)

## 2015-10-05 LAB — PROTIME-INR
INR: 1.24 (ref 0.00–1.49)
PROTHROMBIN TIME: 15.8 s — AB (ref 11.6–15.2)

## 2015-10-05 LAB — CORTISOL: Cortisol, Plasma: 26.3 ug/dL

## 2015-10-05 MED ORDER — PANTOPRAZOLE SODIUM 40 MG IV SOLR
40.0000 mg | Freq: Two times a day (BID) | INTRAVENOUS | Status: DC
  Administered 2015-10-05 – 2015-10-13 (×16): 40 mg via INTRAVENOUS
  Filled 2015-10-05 (×17): qty 40

## 2015-10-05 NOTE — Progress Notes (Signed)
Advanced Heart Failure Team Rounding Note  Subjective:  Paul Morton is a 54 year old male with h/o tobacco and ETOH abuse who recently developed large cervical adenopathy and intractable back pain. Underwent PET scan earlier this month which showed hypermetabolic left cervical lymphadenopathy involving levels II-IV and presence of hepatomegaly with diffuse liver hypermetabolism suggesting probably lymphoma.   Admitted 9/26 with multisystem organ failure. Echo performed and EF 10-15% with moderate RV dysfunction. Initial co-ox drawn was 55%. Started on milrinone. Intubated and transferred to [facility]. Developed large GI bleed. EGDwith esophagitis.   Underwent core biopsy of cervical node 9/27. ->  Hodgkin's lymphoma. Adcetria given 9/30 for Hodgkins.  Remains intubated. Wide awake on vent. Weaning ok.  On CVVHD and dobutamine and norepi. Norepi down to 10. SBP 90. Remains anuric (<10cc/hr).  Co-ox 54% CVP 13    Objective:    Vital Signs:   Temp:  [96.8 F (36 C)-98.6 F (37 C)] 97.6 F (36.4 C) (10/06 0330) Pulse Rate:  [56-134] 99 (10/06 0515) Resp:  [10-24] 19 (10/06 0515) BP: (71-141)/(45-124) 85/66 mmHg (10/06 0515) SpO2:  [78 %-100 %] 100 % (10/06 0515) FiO2 (%):  [40 %] 40 % (10/06 0400) Weight:  [81.3 kg (179 lb 3.7 oz)] 81.3 kg (179 lb 3.7 oz) (10/06 0115) Last BM Date: 10/04/15  Weight change: Filed Weights   10/03/15 0600 10/04/15 0415 10/05/15 0115  Weight: 82.7 kg (182 lb 5.1 oz) 82.9 kg (182 lb 12.2 oz) 81.3 kg (179 lb 3.7 oz)    Intake/Output:   Intake/Output Summary (Last 24 hours) at 10/05/15 0552 Last data filed at 10/05/15 0500  Gross per 24 hour  Intake 2569.15 ml  Output   2247 ml  Net 322.15 ml     Physical Exam: CVP 13 General:  Intubated awake on vent HEENT: normal  Neck: supple. RIJ TLC . LIJ trialysis cath Prominent left neck lymphadenopathy Cor: PMI laterally displaced. regular + s3 Lungs: clear anteriorly   Abdomen: soft, nontender,  nondistended. No bruits or masses. Good bowel sounds. Extremities: no cyanosis, clubbing, rash, no edema. R and L hand mittens Neuro: Awake following commands.   Telemetry: sinus tach. 100-110.   Labs: Basic Metabolic Panel:  Recent Labs Lab 09/29/15 0511  10/01/15 0424  10/03/15 0500 10/03/15 1458 10/04/15 0415 10/04/15 1505 10/05/15 0354  NA 143  < > 141  < > 140 140 140 138 136  K 3.7  < > 4.8  < > 4.2 4.3 4.3 4.6 4.4  CL 92*  < > 90*  < > 89* 87* 98* 102 96*  CO2 33*  < > 30  < > 31 29 29 26 28   GLUCOSE 160*  < > 139*  < > 128* 118* 105* 160* 120*  BUN 128*  < > 168*  < > 200* 190* 87* 73* 41*  CREATININE 5.87*  < > 7.11*  < > 7.70* 7.65* 3.66* 3.10* 1.94*  CALCIUM 6.6*  < > 6.8*  < > 7.3* 7.2* 7.9* 7.7* 8.0*  MG 2.4  --  2.7*  --   --   --  2.5*  --  2.6*  PHOS 6.4*  < > 10.6*  --  11.1* 11.3* 6.3* 5.6* 3.9  < > = values in this interval not displayed.  Liver Function Tests:  Recent Labs Lab 09/30/15 0436 10/01/15 0424 10/02/15 0438 10/03/15 0500 10/03/15 1458 10/04/15 0415 10/04/15 1235 10/04/15 1505 10/05/15 0354  AST 77* 68* 70* 69*  --  66*  --   --   --   ALT 140* 110* 93* 81*  --  85*  --   --   --   ALKPHOS 193* 187* 175* 186*  --  186*  --   --   --   BILITOT 6.0* 6.4* 7.1* 6.7*  --  7.3* 7.7*  --   --   PROT 5.6* 5.6* 5.5* 5.1*  --  5.7*  --   --   --   ALBUMIN 1.9* 1.9* 1.8* 1.8* 1.8* 1.9*  --  1.9* 1.9*   No results for input(s): LIPASE, AMYLASE in the last 168 hours. No results for input(s): AMMONIA in the last 168 hours.  CBC:  Recent Labs Lab 09/29/15 0511  10/02/15 0438 10/03/15 0500 10/04/15 0415 10/04/15 2255 10/05/15 0354  WBC 27.7*  < > 23.9* 21.4* 15.7* 15.5* 12.8*  NEUTROABS 27.1*  --   --   --  14.9* 14.5*  --   HGB 10.0*  < > 10.0* 9.5* 9.6* 8.8* 8.7*  HCT 31.0*  < > 32.1* 29.3* 29.5* 28.3* 27.6*  MCV 74.5*  < > 75.7* 75.1* 74.3* 76.7* 75.4*  PLT 74*  < > 72* 101* 108* 147* 140*  < > = values in this interval not  displayed.  Cardiac Enzymes: No results for input(s): CKTOTAL, CKMB, CKMBINDEX, TROPONINI in the last 168 hours.  BNP: BNP (last 3 results) No results for input(s): BNP in the last 8760 hours.  ProBNP (last 3 results) No results for input(s): PROBNP in the last 8760 hours.   CBG:  Recent Labs Lab 10/04/15 0409 10/04/15 0758 10/04/15 1143 10/04/15 1936 10/04/15 2348  GLUCAP 102* 100* 162* 171* 127*    Coagulation Studies:  Recent Labs  10/03/15 0500  LABPROT 17.5*  INR 1.42     Imaging: Dg Chest Port 1 View  10/04/2015   CLINICAL DATA:  Status post dialysis catheter placement  EXAM: PORTABLE CHEST - 1 VIEW  COMPARISON:  10/04/2015  FINDINGS: Cardiac shadow remains enlarged. An endotracheal tube, feeding catheter, right jugular central line and left dialysis catheter are again seen and stable. The tip of the dialysis catheter now lies slightly deeper within the superior vena cava. No pneumothorax is noted. Diffuse vascular congestion is again identified and stable. Persistent bibasilar changes are noted.  IMPRESSION: Tip of the dialysis catheter is slightly deeper within the superior vena cava. The remainder of the exam is unchanged   Electronically Signed   By: Inez Catalina M.D.   On: 10/04/2015 13:46   Dg Chest Port 1 View  10/04/2015   CLINICAL DATA:  ARDS  EXAM: PORTABLE CHEST 1 VIEW  COMPARISON:  10/03/2015  FINDINGS: Endotracheal tube in good position. Right jugular catheter has been advanced with tip in the lower SVC. Left jugular central venous catheter tip in the SVC unchanged. Feeding tube enters the stomach with the tip not visualized.  No pneumothorax  Progression of bibasilar airspace opacities. This may represent atelectasis or pneumonia.  Cardiac enlargement with vascular congestion. Possible small right effusion  IMPRESSION: Progression of bibasilar atelectasis/infiltrate. Possible small right effusion.   Electronically Signed   By: Franchot Gallo M.D.   On:  10/04/2015 07:05   Dg Chest Port 1 View  10/03/2015   CLINICAL DATA:  Central line placement.  EXAM: PORTABLE CHEST 1 VIEW  COMPARISON:  Film previously performed today at 0506 hours  FINDINGS: New larger caliber left jugular central line has been placed with the  tip in the upper SVC. No pneumothorax after placement. Endotracheal tube remains with the tip 3.4 cm above the carina. Right jugular central line shows stable positioning. Lungs show suggestion of mild interstitial edema which is relatively stable. No pleural effusions. Stable cardiac enlargement.  IMPRESSION: Newly placed left jugular central line tip is in the upper SVC. No pneumothorax identified.   Electronically Signed   By: Aletta Edouard M.D.   On: 10/03/2015 16:10     Medications:     Current Medications: . acyclovir  400 mg Per Tube Daily  . allopurinol  100 mg Per Tube Daily  . antiseptic oral rinse  7 mL Mouth Rinse QID  . chlorhexidine gluconate  15 mL Mouth Rinse BID  . feeding supplement (VITAL HIGH PROTEIN)  1,000 mL Per Tube Q24H  . fentaNYL (SUBLIMAZE) injection  50 mcg Intravenous Once  . pantoprazole (PROTONIX) IV  40 mg Intravenous QHS  . sevelamer carbonate  2.4 g Per Tube TID WC    Infusions: . sodium chloride 10 mL/hr at 10/04/15 2000  . DOBUTamine 5 mcg/kg/min (10/04/15 2000)  . fentaNYL infusion INTRAVENOUS 100 mcg/hr (10/04/15 1954)  . heparin 10,000 units/ 20 mL infusion syringe 1,900 Units/hr (10/05/15 0508)  . norepinephrine (LEVOPHED) Adult infusion 10 mcg/min (10/05/15 0000)  . dialysis replacement fluid (prismasate) 800 mL/hr at 10/05/15 0212  . dialysis replacement fluid (prismasate) 700 mL/hr at 10/05/15 0411  . dialysate (PRISMASATE) 2,000 mL/hr at 10/05/15 0507     Assessment:   1. Cardiogenic shock 2. Acute systolic HF EF 09-47%, etiology uncertain.  Brother has dilated cardiomyopathy so possibly familial.  Reports drinking 6 beers/day long-term.  3. Acute respiratory failure 4.  Multisystem organ failure 5. Acute kidney injury 6. Shock liver 7. Hyperkalemia 8. Cervical lymphadenopathy - path appears to be Hodgkins lymphoma. Adcetria given 9/30    --s/p core biopsy 9/27 9. Tobacco use 10. ETOH use - 4+ beers per day 11. Family h/o NICM in brother 62. Massive upper GI bleed     --severe esophagitis by EGD on 9/27 13. Coagulopathy - due to shock liver with ? DIC 14. ?CAP  Plan/Discussion:     Remains hemodynamically tenuous despite dual pressors. Co-ox 54% so would not wean inotropes further at this point. Volume up slightly. Remains anuric on CVVHD. Will plan repeat echo early next week. Continue supportive care as the oncology team treats his malignancy. Prognosis very guarded but we will continue to take one day at a time and hopefully he will begin to get some cardiac recovery. Hopefully can wean vent soon.   The patient is critically ill with multiple organ systems failure and requires high complexity decision making for assessment and support, frequent evaluation and titration of therapies, application of advanced monitoring technologies and extensive interpretation of multiple databases.   Critical Care Time devoted to patient care services described in this note is 35 Minutes.  Bensimhon, Daniel,MD 5:52 AM Advanced Heart Failure Team Pager [phone] (M-F; 7a - 4p)  Please contact [facility] for night-coverage after hours (4p -7a ) and weekends on [email or web address]

## 2015-10-05 NOTE — Progress Notes (Signed)
200cc fentanyl wasted in sink witnessed by Lenor Coffin RN Dorena Cookey, RN

## 2015-10-05 NOTE — Progress Notes (Signed)
Paul Morton still is intubated but apers fairly alert.  On CVVH.  His BUN and creatinine have improved nicely.  He is still on pressor support.  His platelet count is much better!!  Hopefully, this is a sign of decreased HD in the liver.  His ALT/AST were improving although the bilirubin is trending up.  The adenopathy on the left neck appears to be resolving nicely.  His prognosis from my point of view is based on his cardiac status.  The nurse says he may be having some melena. He is on PPI.  His Hgb has been trending down.  His WBC is holding steady.  So, far, I see no toxicity from the Adcetris. I think we can d/c the allopurinol.   On his PE, the BP is low at 90/68.  HR is 106.  He is afebrile.  The adenopathy is the left neck is much less palpable.  There is no hepatosplenomegaly.  He is now d#7 post-Adcetris.  So far everything is going according to plan.   We will continue to pray for him.  The staff in the ICU is doing a phenomenal job with him!!  Laurey Arrow E.  2 [place]ss[place]onians 3:3

## 2015-10-05 NOTE — Procedures (Signed)
Extubation Procedure Note  Patient Details:   Name: Dayvion Sans DOB: 14-Dec-1961 MRN: [id number]   Airway Documentation:     Evaluation  O2 sats: stable throughout and currently acceptable Complications: No apparent complications Patient did tolerate procedure well. Bilateral Breath Sounds: Clear Suctioning: Airway Yes  Miquel Dunn 10/05/2015, 1:34 PM

## 2015-10-05 NOTE — Progress Notes (Signed)
PULMONARY / CRITICAL CARE MEDICINE   Name: Paul Morton MRN: [id number] DOB: September 09, 1961    ADMISSION DATE:  09/20/2015  REFERRING MD :  EDP  CHIEF COMPLAINT:  Sepsis   INITIAL PRESENTATION:  53yo male smoker with recent diagnosis lymphoma (still in early stages of workup) who presented 9/25 with progressive back pain.  In ER found to be tachycardic, hypotensive with lactate 14, AKI and coagulopathy.  PCCM called to admit.    STUDIES:  PET 9/22 - Bulky confluent hypermetabolic left cervical lymphadenopathy involving levels II-V. Given the presence of hepatomegaly with diffuse liver hypermetabolism, a diagnosis of lymphoma is favored, however metastatic carcinoma remains on the differential.  Hypermetabolic patchy ground-glass opacity and consolidation in both upper lobes and right lower lobe, favor an infectious or inflammatory etiology.  TTE 9/26 - EF 10-15% w/o regional wall motion abnormality. Grade 3 diastolic dysfunction. Moderate AR. Moderate MR. RV severely dilated w/ moderate reduction in systolic function. PASP 43 mmHg. No pericardial effusion.  RLE Venous Duplex 9/26 - no DVT or SVT  MRI spine 9/25 - incomplete exam, No evidence of significant spinal stenosis or cord compression in the cervical or visualized thoracic spine.   MRI L-spine 9/27 - suboptimal exam. No malignant or suspicious signal.  SIGNIFICANT EVENTS: 9/25 - Admitted to hospital 9/25 - Right IJ CVC by ED 9/26 - Intubated for pending respiratory failure & transferred to [facility] 9/27 - EGD w/ erosive esophagitis 9/27 - FNA neck mass 9/30 - Self extubation during SBT 10/1 - Reintubated  10/4 cvvhd started 10/5- hd cathter out, new placed, cvvhd restarted  SUBJECTIVE:  Vomiting, residuals high  VITAL SIGNS: Temp:  [97.4 F (36.3 C)-98.6 F (37 C)] 97.4 F (36.3 C) (10/06 1141) Pulse Rate:  [91-134] 97 (10/06 1200) Resp:  [7-24] 17 (10/06 1200) BP: (71-102)/(45-73) 88/54 mmHg (10/06 1200) SpO2:  [99  %-100 %] 100 % (10/06 1200) FiO2 (%):  [40 %] 40 % (10/06 1200) Weight:  [81.3 kg (179 lb 3.7 oz)] 81.3 kg (179 lb 3.7 oz) (10/06 0115) HEMODYNAMICS: CVP:  [12 mmHg-18 mmHg] 16 mmHg VENTILATOR SETTINGS: Vent Mode:  [-] PRVC FiO2 (%):  [40 %] 40 % Set Rate:  [16 bmp] 16 bmp Vt Set:  [470 mL] 470 mL PEEP:  [5 cmH20] 5 cmH20 Pressure Support:  [5 cmH20] 5 cmH20 Plateau Pressure:  [13 cmH20-14 cmH20] 13 cmH20 INTAKE / OUTPUT:  Intake/Output Summary (Last 24 hours) at 10/05/15 1218 Last data filed at 10/05/15 1200  Gross per 24 hour  Intake 1995.1 ml  Output   1691 ml  Net  304.1 ml    PHYSICAL EXAMINATION: General:  rass 0, awake Integument: no rashes HEENT:  ETT in place, jvd , lymphad present Cardiovascular:  s1 s2 rrt no m Pulmonary: coarse bases Abdomen: Soft, non tender Neurological: RASS 0  LABS:  CBC  Recent Labs Lab 10/04/15 0415 10/04/15 2255 10/05/15 0354  WBC 15.7* 15.5* 12.8*  HGB 9.6* 8.8* 8.7*  HCT 29.5* 28.3* 27.6*  PLT 108* 147* 140*   Coag's  Recent Labs Lab 10/01/15 0424 10/02/15 0438 10/03/15 0500 10/04/15 0415 10/05/15 0354  APTT 30 30  --  40* 61*  INR 1.54* 1.49 1.42  --   --    BMET  Recent Labs Lab 10/04/15 0415 10/04/15 1505 10/05/15 0354  NA 140 138 136  K 4.3 4.6 4.4  CL 98* 102 96*  CO2 29 26 28   BUN 87* 73* 41*  CREATININE 3.66* 3.10*  1.94*  GLUCOSE 105* 160* 120*   Electrolytes  Recent Labs Lab 10/01/15 0424  10/04/15 0415 10/04/15 1505 10/05/15 0354  CALCIUM 6.8*  < > 7.9* 7.7* 8.0*  MG 2.7*  --  2.5*  --  2.6*  PHOS 10.6*  < > 6.3* 5.6* 3.9  < > = values in this interval not displayed.   Sepsis Markers  Recent Labs Lab 09/30/15 1519 10/01/15 0424 10/02/15 0438  PROCALCITON 11.80 11.78 10.90   ABG  Recent Labs Lab 09/30/15 1445 09/30/15 1620 10/02/15 0420  PHART 7.539* 7.491* 7.453*  PCO2ART 31.1* 37.3 41.7  PO2ART 106* 129* 170*   Liver Enzymes  Recent Labs Lab 10/02/15 0438  10/03/15 0500  10/04/15 0415 10/04/15 1235 10/04/15 1505 10/05/15 0354  AST 70* 69*  --  66*  --   --   --   ALT 93* 81*  --  85*  --   --   --   ALKPHOS 175* 186*  --  186*  --   --   --   BILITOT 7.1* 6.7*  --  7.3* 7.7*  --   --   ALBUMIN 1.8* 1.8*  < > 1.9*  --  1.9* 1.9*  < > = values in this interval not displayed. Cardiac Enzymes No results for input(s): TROPONINI, PROBNP in the last 168 hours. Glucose  Recent Labs Lab 10/04/15 0409 10/04/15 0758 10/04/15 1143 10/04/15 1936 10/04/15 2348 10/05/15 0757  GLUCAP 102* 100* 162* 171* 127* 104*    Imaging Dg Chest Port 1 View  10/04/2015   CLINICAL DATA:  Status post dialysis catheter placement  EXAM: PORTABLE CHEST - 1 VIEW  COMPARISON:  10/04/2015  FINDINGS: Cardiac shadow remains enlarged. An endotracheal tube, feeding catheter, right jugular central line and left dialysis catheter are again seen and stable. The tip of the dialysis catheter now lies slightly deeper within the superior vena cava. No pneumothorax is noted. Diffuse vascular congestion is again identified and stable. Persistent bibasilar changes are noted.  IMPRESSION: Tip of the dialysis catheter is slightly deeper within the superior vena cava. The remainder of the exam is unchanged   Electronically Signed   By: Inez Catalina M.D.   On: 10/04/2015 13:46    ASSESSMENT / PLAN:  PULMONARY ETT 9/26>>>9/30 (self extubated)>10/1  A: Acute hypoxic respiratory failure 2nd to HCAP with ARDS >> less likely pulmonary edema. Hx of tobacco abuse. R/o lymphangitic spread P:   ARDS protcol, keeping plat less 30  cvvhd important for further neg balance goals? Will d/w renal, despite levophed cpap 5 ps 5, sbt attempt, concern for volumes, repeat now Upright position pcxr now, if remains with int changes, may need CT chest  CARDIOVASCULAR R IJ CVL (EDP) 9/25>>> A: Nonischemic Cardiomyopathy (echo EF 10%, Gr 3 DD ) with acute systolic CHF. Shock - combination of  sepsis and cardiac. R/o burn out adrenal gland P:  Wean levophed to keep MAP > 60 with good MS, repeat svo2 4 pm  Dobutamine Levophed at 10, OK by me to have neg balance cvvhd and follow pressor needs cortisol  RENAL A: Acute Renal Failure >> likely from CHF, sepsis, and diuresis >> creatinine rise again noted P:   cvvhd restart needed Chem in am  Neg balance goals, start today i hope Will support MAp with levophed  GASTROINTESTINAL A: Shock liver - improving. Upper GIB - secondary to erosive esophagitis on EGD. Protein calorie malnutrition. R/o ileus hyperbili (lymphoma ), cholestasis likley P:  Tube feeds hold BID protonix KUb, likely to need reglan? Some blod below, may need citrate  HEMATOLOGIC A: New dx of Hodgkin's lymphoma Coagulopathy >> resolved. Anemia of critical illness, and GI bleed. Thrombocytopenia. P:  F/u CBC, INR now SCDs Allopurinol off Chemotherapy per onc, tolerated first treatment  INFECTIOUS A: Septic shock 2nd to HCAP >> procalcitonin trending down. P:   Vancomycin 9/25>>>off Zosyn 9/25>>>full course completed Zovirax for prophylaxis  Sputum 10/01 >>candida   ENDOCRINE A: Hyperglycemia. P:   SSI if CBG > 180  NEUROLOGIC A: Acute metabolic encephalopathy. P:   RASS goal 0 fent to 100, requires versed prn wua   Updated brother   Ccm time 107 mi n  Lavon Paganini. Titus Mould, MD, [place] Pgr: [facility]

## 2015-10-05 NOTE — Progress Notes (Signed)
Subjective: Interval History: has no complaint, awake and coop.  Objective: Vital signs in last 24 hours: Temp:  [97.5 F (36.4 C)-98.6 F (37 C)] 97.6 F (36.4 C) (10/06 0330) Pulse Rate:  [56-134] 99 (10/06 0645) Resp:  [10-24] 15 (10/06 0645) BP: (71-141)/(45-124) 90/68 mmHg (10/06 0645) SpO2:  [78 %-100 %] 100 % (10/06 0645) FiO2 (%):  [40 %] 40 % (10/06 0400) Weight:  [81.3 kg (179 lb 3.7 oz)] 81.3 kg (179 lb 3.7 oz) (10/06 0115) Weight change: -1.6 kg (-3 lb 8.4 oz)  Intake/Output from previous day: 10/05 0701 - 10/06 0700 In: 2355.7 [I.V.:1305.7; NG/GT:1020] Out: 2011 [Urine:10] Intake/Output this shift: Total I/O In: 795.3 [I.V.:545.3; Other:30; NG/GT:220] Out: 812 [Other:812]  General appearance: cooperative and pale Neck: IJ cath Resp: rales bibasilar Cardio: S1, S2 normal and systolic murmur: holosystolic 2/6, blowing at apex GI: pos bs, liver down 4 cm Extremities: edema 2-3+  Lab Results:  Recent Labs  10/04/15 2255 10/05/15 0354  WBC 15.5* 12.8*  HGB 8.8* 8.7*  HCT 28.3* 27.6*  PLT 147* 140*   BMET:  Recent Labs  10/04/15 1505 10/05/15 0354  NA 138 136  K 4.6 4.4  CL 102 96*  CO2 26 28  GLUCOSE 160* 120*  BUN 73* 41*  CREATININE 3.10* 1.94*  CALCIUM 7.7* 8.0*   No results for input(s): PTH in the last 72 hours. Iron Studies: No results for input(s): IRON, TIBC, TRANSFERRIN, FERRITIN in the last 72 hours.  Studies/Results: Dg Chest Port 1 View  10/04/2015   CLINICAL DATA:  Status post dialysis catheter placement  EXAM: PORTABLE CHEST - 1 VIEW  COMPARISON:  10/04/2015  FINDINGS: Cardiac shadow remains enlarged. An endotracheal tube, feeding catheter, right jugular central line and left dialysis catheter are again seen and stable. The tip of the dialysis catheter now lies slightly deeper within the superior vena cava. No pneumothorax is noted. Diffuse vascular congestion is again identified and stable. Persistent bibasilar changes are noted.   IMPRESSION: Tip of the dialysis catheter is slightly deeper within the superior vena cava. The remainder of the exam is unchanged   Electronically Signed   By: Inez Catalina M.D.   On: 10/04/2015 13:46   Dg Chest Port 1 View  10/04/2015   CLINICAL DATA:  ARDS  EXAM: PORTABLE CHEST 1 VIEW  COMPARISON:  10/03/2015  FINDINGS: Endotracheal tube in good position. Right jugular catheter has been advanced with tip in the lower SVC. Left jugular central venous catheter tip in the SVC unchanged. Feeding tube enters the stomach with the tip not visualized.  No pneumothorax  Progression of bibasilar airspace opacities. This may represent atelectasis or pneumonia.  Cardiac enlargement with vascular congestion. Possible small right effusion  IMPRESSION: Progression of bibasilar atelectasis/infiltrate. Possible small right effusion.   Electronically Signed   By: Franchot Gallo M.D.   On: 10/04/2015 07:05   Dg Chest Port 1 View  10/03/2015   CLINICAL DATA:  Central line placement.  EXAM: PORTABLE CHEST 1 VIEW  COMPARISON:  Film previously performed today at 0506 hours  FINDINGS: New larger caliber left jugular central line has been placed with the tip in the upper SVC. No pneumothorax after placement. Endotracheal tube remains with the tip 3.4 cm above the carina. Right jugular central line shows stable positioning. Lungs show suggestion of mild interstitial edema which is relatively stable. No pleural effusions. Stable cardiac enlargement.  IMPRESSION: Newly placed left jugular central line tip is in the upper SVC. No  pneumothorax identified.   Electronically Signed   By: Aletta Edouard M.D.   On: 10/03/2015 16:10    I have reviewed the patient's current medications.  Assessment/Plan: 1 AKI oliguric now. Good solute, acid/base/K.  Mild vol xs.  Tapering NE, if can get off, try for net neg and  Convert to IHD.  Appears all ATN 2 VDRF per CCM 3 Malnutrition on TF 4 Hodgkins  Per Onc 5 Hypotension improving 6 Enceph  improving P CRRT, even, TF , vent.      LOS: 11 days   Paul Morton 10/05/2015,6:51 AM

## 2015-10-06 DIAGNOSIS — K7200 Acute and subacute hepatic failure without coma: Secondary | ICD-10-CM

## 2015-10-06 LAB — RENAL FUNCTION PANEL
ALBUMIN: 1.9 g/dL — AB (ref 3.5–5.0)
ANION GAP: 11 (ref 5–15)
Albumin: 1.8 g/dL — ABNORMAL LOW (ref 3.5–5.0)
Anion gap: 12 (ref 5–15)
BUN: 26 mg/dL — AB (ref 6–20)
BUN: 20 mg/dL (ref 6–20)
CALCIUM: 8.3 mg/dL — AB (ref 8.9–10.3)
CHLORIDE: 98 mmol/L — AB (ref 101–111)
CO2: 27 mmol/L (ref 22–32)
CO2: 26 mmol/L (ref 22–32)
CREATININE: 1.33 mg/dL — AB (ref 0.61–1.24)
Calcium: 8.2 mg/dL — ABNORMAL LOW (ref 8.9–10.3)
Chloride: 101 mmol/L (ref 101–111)
Creatinine, Ser: 1.25 mg/dL — ABNORMAL HIGH (ref 0.61–1.24)
GFR calc Af Amer: >60 mL/min (ref >60)
GFR calc non Af Amer: 59 mL/min — ABNORMAL LOW (ref >60)
GLUCOSE: 106 mg/dL — AB (ref 65–99)
Glucose, Bld: 90 mg/dL (ref 65–99)
PHOSPHORUS: 2.9 mg/dL (ref 2.5–4.6)
POTASSIUM: 4.5 mmol/L (ref 3.5–5.1)
Phosphorus: 3.1 mg/dL (ref 2.5–4.6)
Potassium: 4.4 mmol/L (ref 3.5–5.1)
SODIUM: 138 mmol/L (ref 135–145)
Sodium: 137 mmol/L (ref 135–145)

## 2015-10-06 LAB — HEPATIC FUNCTION PANEL
ALT: 71 U/L — ABNORMAL HIGH (ref 17–63)
AST: 55 U/L — ABNORMAL HIGH (ref 15–41)
Albumin: 1.8 g/dL — ABNORMAL LOW (ref 3.5–5.0)
Alkaline Phosphatase: 201 U/L — ABNORMAL HIGH (ref 38–126)
Bilirubin, Direct: 4.7 mg/dL — ABNORMAL HIGH (ref 0.1–0.5)
Indirect Bilirubin: 2.5 mg/dL — ABNORMAL HIGH (ref 0.3–0.9)
Total Bilirubin: 7.2 mg/dL — ABNORMAL HIGH (ref 0.3–1.2)
Total Protein: 5.4 g/dL — ABNORMAL LOW (ref 6.5–8.1)

## 2015-10-06 LAB — BLOOD GAS, VENOUS
Acid-Base Excess: 2.4 mmol/L — ABNORMAL HIGH (ref 0.0–2.0)
Bicarbonate: 26.3 mEq/L — ABNORMAL HIGH (ref 20.0–24.0)
O2 Content: 2 L/min
O2 Saturation: 58.3 %
PATIENT TEMPERATURE: 98.6
PCO2 VEN: 40.1 mmHg — AB (ref 45.0–50.0)
PH VEN: 7.433 — AB (ref 7.250–7.300)
TCO2: 27.6 mmol/L (ref 0–100)
pO2, Ven: 34.4 mmHg (ref 30.0–45.0)

## 2015-10-06 LAB — CARBOXYHEMOGLOBIN
CARBOXYHEMOGLOBIN: 2 % — AB (ref 0.5–1.5)
Carboxyhemoglobin: 2 % — ABNORMAL HIGH (ref 0.5–1.5)
Carboxyhemoglobin: 2.3 % — ABNORMAL HIGH (ref 0.5–1.5)
METHEMOGLOBIN: 1.1 % (ref 0.0–1.5)
Methemoglobin: 1 % (ref 0.0–1.5)
Methemoglobin: 1.1 % (ref 0.0–1.5)
O2 SAT: 55.7 %
O2 Saturation: 45.9 %
O2 Saturation: 36.4 %
TOTAL HEMOGLOBIN: 8.5 g/dL — AB (ref 13.5–18.0)
Total hemoglobin: 9.3 g/dL — ABNORMAL LOW (ref 13.5–18.0)
Total hemoglobin: 8.3 g/dL — ABNORMAL LOW (ref 13.5–18.0)

## 2015-10-06 LAB — CBC
HCT: 26.6 % — ABNORMAL LOW (ref 39.0–52.0)
Hemoglobin: 8.5 g/dL — ABNORMAL LOW (ref 13.0–17.0)
MCH: 23.9 pg — ABNORMAL LOW (ref 26.0–34.0)
MCHC: 32 g/dL (ref 30.0–36.0)
MCV: 74.7 fL — ABNORMAL LOW (ref 78.0–100.0)
Platelets: 139 10*3/uL — ABNORMAL LOW (ref 150–400)
RBC: 3.56 MIL/uL — ABNORMAL LOW (ref 4.22–5.81)
RDW: 17.9 % — ABNORMAL HIGH (ref 11.5–15.5)
WBC: 16.7 10*3/uL — ABNORMAL HIGH (ref 4.0–10.5)

## 2015-10-06 LAB — POCT ACTIVATED CLOTTING TIME
ACTIVATED CLOTTING TIME: 177 s
ACTIVATED CLOTTING TIME: 183 s
ACTIVATED CLOTTING TIME: 189 s
ACTIVATED CLOTTING TIME: 190 s
ACTIVATED CLOTTING TIME: 196 s
Activated Clotting Time: 183 seconds

## 2015-10-06 LAB — GLUCOSE, CAPILLARY
GLUCOSE-CAPILLARY: 82 mg/dL (ref 65–99)
Glucose-Capillary: 89 mg/dL (ref 65–99)

## 2015-10-06 LAB — MAGNESIUM: Magnesium: 2.5 mg/dL — ABNORMAL HIGH (ref 1.7–2.4)

## 2015-10-06 LAB — APTT: APTT: 99 s — AB (ref 24–37)

## 2015-10-06 MED ORDER — LOPERAMIDE HCL 2 MG PO CAPS
2.0000 mg | ORAL_CAPSULE | ORAL | Status: DC | PRN
  Administered 2015-10-06 – 2015-10-08 (×2): 2 mg via ORAL
  Filled 2015-10-06 (×3): qty 1

## 2015-10-06 MED ORDER — DOBUTAMINE IN D5W 4-5 MG/ML-% IV SOLN
5.0000 ug/kg/min | INTRAVENOUS | Status: DC
  Administered 2015-10-07: 2.5 ug/kg/min via INTRAVENOUS
  Filled 2015-10-06: qty 250

## 2015-10-06 NOTE — Progress Notes (Signed)
Subjective: Interval History: has no complaint "doing all right".  Objective: Vital signs in last 24 hours: Temp:  [96.3 F (35.7 C)-98.4 F (36.9 C)] 97.9 F (36.6 C) (10/07 0400) Pulse Rate:  [96-109] 106 (10/07 0650) Resp:  [7-28] 17 (10/07 0650) BP: (67-98)/(53-71) 82/56 mmHg (10/07 0650) SpO2:  [97 %-100 %] 100 % (10/07 0650) FiO2 (%):  [40 %] 40 % (10/06 1225) Weight:  [77.2 kg (170 lb 3.1 oz)] 77.2 kg (170 lb 3.1 oz) (10/07 0200) Weight change: -4.1 kg (-9 lb 0.6 oz)  Intake/Output from previous day: 10/06 0701 - 10/07 0700 In: 766 [I.V.:656; NG/GT:90] Out: 2144 [Urine:13; Emesis/NG output:1300] Intake/Output this shift:    General appearance: alert, cooperative and pale Neck: Morton IJ Cath Resp: rales bibasilar Cardio: S1, S2 normal, systolic murmur: holosystolic 2/6, blowing at apex and freq irreg GI: pos bs, liver down 6 cm Extremities: edema 2+  Lab Results:  Recent Labs  10/05/15 0354 10/06/15 0339  WBC 12.8* 16.7*  HGB 8.7* 8.5*  HCT 27.6* 26.6*  PLT 140* 139*   BMET:  Recent Labs  10/05/15 1510 10/06/15 0338  NA 139 137  K 4.6 4.4  CL 101 98*  CO2 28 27  GLUCOSE 109* 90  BUN 34* 26*  CREATININE 1.58* 1.33*  CALCIUM 8.3* 8.2*   No results for input(s): PTH in the last 72 hours. Iron Studies: No results for input(s): IRON, TIBC, TRANSFERRIN, FERRITIN in the last 72 hours.  Studies/Results: Dg Chest Port 1 View  10/05/2015   CLINICAL DATA:  Dyspnea  EXAM: PORTABLE CHEST 1 VIEW  COMPARISON:  October 04, 2015  FINDINGS: The heart size is enlarged. The mediastinal contour is normal. Endotracheal tube is identified distal tip 5.8 cm from carina. Bilateral central venous lines are identified unchanged with the tips in the superior vena cava. There is minimal central pulmonary vascular congestion. There is no focal pneumonia or pleural effusion. The bony structures are stable.  IMPRESSION: Mild central pulmonary vascular congestion decrease compared to  prior exam. There is no focal pneumonia or pleural effusion.  Cardiomegaly.   Electronically Signed   By: Abelardo Diesel M.D.   On: 10/05/2015 14:12   Dg Chest Port 1 View  10/04/2015   CLINICAL DATA:  Status post dialysis catheter placement  EXAM: PORTABLE CHEST - 1 VIEW  COMPARISON:  10/04/2015  FINDINGS: Cardiac shadow remains enlarged. An endotracheal tube, feeding catheter, right jugular central line and left dialysis catheter are again seen and stable. The tip of the dialysis catheter now lies slightly deeper within the superior vena cava. No pneumothorax is noted. Diffuse vascular congestion is again identified and stable. Persistent bibasilar changes are noted.  IMPRESSION: Tip of the dialysis catheter is slightly deeper within the superior vena cava. The remainder of the exam is unchanged   Electronically Signed   By: Inez Catalina M.D.   On: 10/04/2015 13:46   Dg Abd Portable 1v  10/05/2015   CLINICAL DATA:  Vomiting  EXAM: PORTABLE ABDOMEN - 1 VIEW  COMPARISON:  October 01, 2015  FINDINGS: A feeding tube is identified with distal tip in the distal stomach. There is relative paucity of gas gas, nonspecific. There is no evidence of free air.  IMPRESSION: Feeding tube distal tip in the distal stomach. Relative paucity of bowel gas, nonspecific.   Electronically Signed   By: Abelardo Diesel M.D.   On: 10/05/2015 14:13    I have reviewed the patient's current medications.  Assessment/Plan: 1  AKI Oliguric ATN , acid/base/K ok . Will try for net neg as discussed with CCM.   2 BP marginal lower NE.  On DB 3 Hodgkins 4 Severe CM  Limiting outcomes 5 Schock liver 6 Resp failure off vent P CRRT, net neg, inotropes, TF, tx Hodgkins, foley out   LOS: 12 days   Paul Morton 10/06/2015,7:09 AM

## 2015-10-06 NOTE — Evaluation (Signed)
Clinical/Bedside Swallow Evaluation Patient Details  Name: Paul Morton MRN: [id number] Date of Birth: 1961-05-25  Today's Date: 10/06/2015 Time: SLP Start Time (ACUTE ONLY): 0959 SLP Stop Time (ACUTE ONLY): 1010 SLP Time Calculation (min) (ACUTE ONLY): 11 min  Past Medical History:  Past Medical History  Diagnosis Date  . Alcohol abuse 08/2015    admits to 6 pack beer daily.   . Mass of neck 05/2015    multiple soft tissue masses, palpable and seen on CT.  present since at leat 05/2015.   Marland Kitchen Anemia 08/2015    Iron deficient, microcytic  . Elevated LFTs 08/2015    acute hep ABC serologies negative.   Marland Kitchen Herpes zoster 03/2014    right C3-4, T1-2 distribution.   . Gynecomastia 08/2015    per PET scan  . Opacity of lung on imaging study 08/2015    bilateral, with effusion and mild ephysema per PET scan  . Diverticulosis of colon 08/2015    per PET scan  . Hepatomegaly 08/2015    and 1.6 cm liver cyst per PET.   Marland Kitchen Ascites 08/2015    small abd ascites and mild anasarca per PET  . CHF (congestive heart failure) 08/2015    Severe dilatation of all of the heart chambers. EF 10 to 15%.  Severe LV and moderate RV systolic dysfunction. Restrictive pattern of diastolic dysfunction. Moderate aortic, mitral, pulmonic and tricuspid regurgitation. Mild to moderate pulmonary hypertension  . AKI (acute kidney injury) 08/2015  . 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 Coffee ground emesis 09/16/2015    grade c erosive esophagitis and small HH on EGD  . 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 Tonsillectomy    . Esophagogastroduodenoscopy N/A 09/02/2015    Procedure: ESOPHAGOGASTRODUODENOSCOPY (EGD);  Surgeon: Ladene Artist, MD;  Location: [facility] ENDOSCOPY;  Service: Endoscopy;  Laterality: N/A;   HPI:  54yo male smoker with recent diagnosis lymphoma (still in early stages of  workup) who presented 9/25 with progressive back pain. In ER found to be tachycardic, hypotensive with lactate 14, AKI and coagulopathy. Intubated 9/26, self extubated 9/30, reintubated 10/1-10/6. Found to have erosive esophagitis, soft masses in sternocleidomastiod musculature (pharynx and larynx normal on CT neck). Has been on CVVHD.    Assessment / Plan / Recommendation Clinical Impression  Despite presence of Kangaroo NG set to suction, as well as lenghty intubation time, pt demonstrates no signs of dysphagia and minimal risk for silent aspriation. Vocal Jobe Gibbon is clear and volitional cough is strong. Pt is able to consume regular solids and thin liquids when medically ready, MD requests full liquids for now. SLP will sign off and defer diet upgrade to MD.    Aspiration Risk  Mild    Diet Recommendation Age appropriate regular solids;Thin ( Full liquids for now)   Medication Administration: Whole meds with liquid    Other  Recommendations Oral Care Recommendations: Oral care BID   Follow Up Recommendations       Frequency and Duration        Pertinent Vitals/Pain NA    SLP Swallow Goals     Swallow Study Prior Functional Status       General Other Pertinent Information: 54yo male smoker with recent diagnosis lymphoma (still in early stages of workup) who presented 9/25 with progressive back pain. In ER found to  be tachycardic, hypotensive with lactate 14, AKI and coagulopathy. Intubated 9/26, self extubated 9/30, reintubated 10/1-10/6. Found to have erosive esophagitis, soft masses in sternocleidomastiod musculature (pharynx and larynx normal on CT neck). Has been on CVVHD.  Type of Study: Bedside swallow evaluation Diet Prior to this Study: NPO Temperature Spikes Noted: No Respiratory Status: Room air History of Recent Intubation: Yes Length of Intubations (days): 10 days Date extubated: 10/05/15 Behavior/Cognition: Alert;Cooperative Oral Cavity - Dentition: Adequate  natural dentition/normal for age Self-Feeding Abilities: Able to feed self Patient Positioning: Upright in bed Baseline Vocal Quality: Normal Volitional Cough: Strong Volitional Swallow: Able to elicit    Oral/Motor/Sensory Function Overall Oral Motor/Sensory Function: Appears within functional limits for tasks assessed   Ice Chips     Thin Liquid Thin Liquid: Within functional limits    Nectar Thick Nectar Thick Liquid: Not tested   Honey Thick Honey Thick Liquid: Not tested   Puree Puree: Within functional limits   Solid   GO    Solid: Within functional limits      Herbie [place], MA CCC-SLP [phone]  Jairus Tonne, Katherene Ponto 10/06/2015,10:19 AM

## 2015-10-06 NOTE — Progress Notes (Signed)
[name] is now extubated. I'm so happy for him. It was nice to be able to talk to him .  He is still on pressor support. Again, I really believe that his outcome will be dictated by his cardiac function.  He still is on CVVH. His BUN and creatinine have come down very nicely.oday, the BUN is 26 and hiscreatinine 1.33.hhe is not making much urine right now.  hhis liver tests are holding pretty steady.  He is getting tube feeds.  His CBC is looking pretty good. His hemoglobin is dropping slowly.. I suspect they probably is either deficient given his low MCV.   It is possible that he May need to be transfused if his hemoglobin gets below 8.  On his physical exam, he is afebrile.Marland Kitchen His blood pressure is 82/56. His pulse is 106. His adenopathy in the left neck  Has resolved very nicely. There may be some slight fullness noted. However, any distinct lymphadenopathy is not appreciated.  His lungs show some decrease at the bases. His cardiac exam is tachycardic but regular.Abdomen is soft. Bowel sounds are slightly decreased. There is no palpable hepatomegaly.I cannot palpate a spleen tip. Extremities shows compression hose on his legs. Skin exam shows no rashes or ecchymoses.  His Hodgkin's disease has responded very nicely to treatment. I'm sure that if we did a CT scan, that we would find nice  Resolution of his cervical and mediastinal disease.   His LFTs are much better than when we first saw him. I think he still has, to some degree, a shock liver because of his hypotension. Hopefully this will continue to improve slowly.  His platelet count is much better. Again I think this is a reflection of the improvement in his Hodgkin's disease and also any Hodgkin's disease that was in his liver.  I greatly appreciate all the one full care that he is gotten from the whole staff and doctors in the ICU.  Harriette Ohara 33:6

## 2015-10-06 NOTE — Progress Notes (Signed)
PT Cancellation Note  Patient Details Name: Paul Morton MRN: [id number] DOB: Oct 18, 1961   Cancelled Treatment:    Reason Eval/Treat Not Completed: Patient at procedure or test/unavailable. RN asked for PT to wait until swallow eval was complete prior to mobility evaluation. Pt currently on HD in room. Will check back as schedule allows.    Rolinda Roan 10/06/2015, 10:30 AM   Rolinda Roan, PT, DPT Acute Rehabilitation Services Pager: [phone]

## 2015-10-06 NOTE — Progress Notes (Signed)
PULMONARY / CRITICAL CARE MEDICINE   Name: Avrom Robarts MRN: [id number] DOB: 1961/06/03    ADMISSION DATE:  09/19/2015  REFERRING MD :  EDP  CHIEF COMPLAINT:  Sepsis   INITIAL PRESENTATION:  54yo male smoker with recent diagnosis lymphoma (still in early stages of workup) who presented 9/25 with progressive back pain.  In ER found to be tachycardic, hypotensive with lactate 14, AKI and coagulopathy.  PCCM called to admit.    STUDIES:  PET 9/22 - Bulky confluent hypermetabolic left cervical lymphadenopathy involving levels II-V. Given the presence of hepatomegaly with diffuse liver hypermetabolism, a diagnosis of lymphoma is favored, however metastatic carcinoma remains on the differential.  Hypermetabolic patchy ground-glass opacity and consolidation in both upper lobes and right lower lobe, favor an infectious or inflammatory etiology.  TTE 9/26 - EF 10-15% w/o regional wall motion abnormality. Grade 3 diastolic dysfunction. Moderate AR. Moderate MR. RV severely dilated w/ moderate reduction in systolic function. PASP 43 mmHg. No pericardial effusion.  RLE Venous Duplex 9/26 - no DVT or SVT  MRI spine 9/25 - incomplete exam, No evidence of significant spinal stenosis or cord compression in the cervical or visualized thoracic spine.   MRI L-spine 9/27 - suboptimal exam. No malignant or suspicious signal.  SIGNIFICANT EVENTS: 9/25 - Admitted to hospital 9/25 - Right IJ CVC by ED 9/26 - Intubated for pending respiratory failure & transferred to [facility] 9/27 - EGD w/ erosive esophagitis 9/27 - FNA neck mass 9/30 - Self extubation during SBT 10/1 - Reintubated  10/4 cvvhd started 10/5- hd cathter out, new placed, cvvhd restarted 10/6- extubated  SUBJECTIVE:  No distress  VITAL SIGNS: Temp:  [96.3 F (35.7 C)-97.9 F (36.6 C)] 97.4 F (36.3 C) (10/07 0818) Pulse Rate:  [59-111] 105 (10/07 1100) Resp:  [8-30] 20 (10/07 1100) BP: (67-98)/(52-71) 87/63 mmHg (10/07 1100) SpO2:   [53 %-100 %] 100 % (10/07 1100) FiO2 (%):  [40 %] 40 % (10/06 1225) Weight:  [77.2 kg (170 lb 3.1 oz)] 77.2 kg (170 lb 3.1 oz) (10/07 0200) HEMODYNAMICS: CVP:  [8 mmHg-16 mmHg] 12 mmHg VENTILATOR SETTINGS: Vent Mode:  [-] PSV;CPAP FiO2 (%):  [40 %] 40 % PEEP:  [5 cmH20] 5 cmH20 Pressure Support:  [5 cmH20] 5 cmH20 INTAKE / OUTPUT:  Intake/Output Summary (Last 24 hours) at 10/06/15 1134 Last data filed at 10/06/15 1100  Gross per 24 hour  Intake 730.78 ml  Output   2065 ml  Net -1334.22 ml    PHYSICAL EXAMINATION: General: awake,. O x 3 Integument: no rashes HEENT:  ETT in place, jvd , lymphad present neck left greater rt Cardiovascular:  s1 s2 rrt no m Pulmonary: CTA ant, reduced Abdomen: Soft, non tender Neurological: A O x 3  LABS:  CBC  Recent Labs Lab 10/04/15 2255 10/05/15 0354 10/06/15 0339  WBC 15.5* 12.8* 16.7*  HGB 8.8* 8.7* 8.5*  HCT 28.3* 27.6* 26.6*  PLT 147* 140* 139*   Coag's  Recent Labs Lab 10/02/15 0438 10/03/15 0500  10/05/15 0354 10/05/15 1339 10/06/15 0339  APTT 30  --   < > 61* 44* 99*  INR 1.49 1.42  --   --  1.24  --   < > = values in this interval not displayed. BMET  Recent Labs Lab 10/05/15 0354 10/05/15 1510 10/06/15 0338  NA 136 139 137  K 4.4 4.6 4.4  CL 96* 101 98*  CO2 28 28 27   BUN 41* 34* 26*  CREATININE 1.94* 1.58*  1.33*  GLUCOSE 120* 109* 90   Electrolytes  Recent Labs Lab 10/04/15 0415  10/05/15 0354 10/05/15 1510 10/06/15 0338 10/06/15 0339  CALCIUM 7.9*  < > 8.0* 8.3* 8.2*  --   MG 2.5*  --  2.6*  --   --  2.5*  PHOS 6.3*  < > 3.9 3.4 3.1  --   < > = values in this interval not displayed.   Sepsis Markers  Recent Labs Lab 09/30/15 1519 10/01/15 0424 10/02/15 0438  PROCALCITON 11.80 11.78 10.90   ABG  Recent Labs Lab 09/30/15 1445 09/30/15 1620 10/02/15 0420  PHART 7.539* 7.491* 7.453*  PCO2ART 31.1* 37.3 41.7  PO2ART 106* 129* 170*   Liver Enzymes  Recent Labs Lab  10/03/15 0500  10/04/15 0415 10/04/15 1235  10/05/15 1510 10/06/15 0338 10/06/15 0339  AST 69*  --  66*  --   --   --   --  55*  ALT 81*  --  85*  --   --   --   --  71*  ALKPHOS 186*  --  186*  --   --   --   --  201*  BILITOT 6.7*  --  7.3* 7.7*  --   --   --  7.2*  ALBUMIN 1.8*  < > 1.9*  --   < > 2.0* 1.8* 1.8*  < > = values in this interval not displayed. Cardiac Enzymes No results for input(s): TROPONINI, PROBNP in the last 168 hours. Glucose  Recent Labs Lab 10/04/15 2348 10/05/15 0757 10/05/15 1534 10/05/15 1934 10/05/15 2349 10/06/15 0323  GLUCAP 127* 104* 95 101* 89 82    Imaging Dg Chest Port 1 View  10/05/2015   CLINICAL DATA:  Dyspnea  EXAM: PORTABLE CHEST 1 VIEW  COMPARISON:  October 04, 2015  FINDINGS: The heart size is enlarged. The mediastinal contour is normal. Endotracheal tube is identified distal tip 5.8 cm from carina. Bilateral central venous lines are identified unchanged with the tips in the superior vena cava. There is minimal central pulmonary vascular congestion. There is no focal pneumonia or pleural effusion. The bony structures are stable.  IMPRESSION: Mild central pulmonary vascular congestion decrease compared to prior exam. There is no focal pneumonia or pleural effusion.  Cardiomegaly.   Electronically Signed   By: Abelardo Diesel M.D.   On: 10/05/2015 14:12   Dg Abd Portable 1v  10/05/2015   CLINICAL DATA:  Vomiting  EXAM: PORTABLE ABDOMEN - 1 VIEW  COMPARISON:  October 01, 2015  FINDINGS: A feeding tube is identified with distal tip in the distal stomach. There is relative paucity of gas gas, nonspecific. There is no evidence of free air.  IMPRESSION: Feeding tube distal tip in the distal stomach. Relative paucity of bowel gas, nonspecific.   Electronically Signed   By: Abelardo Diesel M.D.   On: 10/05/2015 14:13    ASSESSMENT / PLAN:  PULMONARY ETT 9/26>>>9/30 (self extubated)>10/1  A: Acute hypoxic respiratory failure 2nd to HCAP with ARDS >>  less likely pulmonary edema. Hx of tobacco abuse. 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 Shock - combination of sepsis and cardiac. R/o burn out adrenal gland P:  Wean levophed to keep MAP > 60 with good MS, repeat svo2 4 pm  Dobutamine, can we increase or change  to milrinone Levophed for MAP goals, I don't believe that levophed will sig increase svo2? Will d/w chf Cortisol did drop but does not need stress roids, repeat in 3 days if remains in shock Would favor neg balance  RENAL A: Acute Renal Failure >> likely from CHF, sepsis, and diuresis >> creatinine rise again noted P:   cvvhd on going Chem in am  Neg balance goals, to goal 50 cc/hr  GASTROINTESTINAL A: Shock liver - improving. Upper GIB - secondary to erosive esophagitis on EGD. Protein calorie malnutrition. R/o ileus hyperbili (lymphoma ), cholestasis likely, shock liver component  Loose stools, prior cdiff neg P:   ppi Slp, passed , advance to renal diet flexi seal, add imodium   HEMATOLOGIC A: New dx of Hodgkin's lymphoma Coagulopathy >> resolved. Anemia of critical illness, and GI bleed. Thrombocytopenia. P:  SCDs Chemotherapy per onc, tolerated first treatment  INFECTIOUS A: Septic shock 2nd to HCAP >> procalcitonin trending down. P:   Vancomycin 9/25>>>off Zosyn 9/25>>>full course completed Zovirax for prophylaxis  Sputum 10/01 >>candida   ENDOCRINE A: Hyperglycemia. P:   SSI if CBG > 180 Controlled, follow on diet  NEUROLOGIC A: Acute metabolic encephalopathy resolved P:   RASS goal 0 Pt in am more aggressive, can do with cvvhd neck line  Updated brother   Ccm time 28 mi n  Lavon Paganini. Titus Mould, MD, [place] Pgr: [facility]

## 2015-10-06 NOTE — Progress Notes (Signed)
Advanced Heart Failure Team Rounding Note  Subjective:  [name] is a 54 year old male with h/o tobacco and ETOH abuse who recently developed large cervical adenopathy and intractable back pain. Underwent PET scan earlier this month which showed hypermetabolic left cervical lymphadenopathy involving levels II-IV and presence of hepatomegaly with diffuse liver hypermetabolism suggesting probably lymphoma.   Admitted 9/26 with multisystem organ failure. Echo performed and EF 10-15% with moderate RV dysfunction. Initial co-ox drawn was 55%. Started on milrinone. Intubated and transferred to [facility]. Developed large GI bleed. EGDwith esophagitis.   Underwent core biopsy of cervical node 9/27. ->  Hodgkin's lymphoma. Adcetria given 9/30 for Hodgkins.  Extubated today. Remains on CVVHD and dobutamine and norepi.. Remains anuric (<10cc/hr).  Co-ox this am 46%. Dobutamine increased to 7.5 by CCM in order to try and wean norepi which is now at 6 mcg/min. Repeat co-ox 36% at 538p. Checked again at 6p and now 56%. CVP remains 13-15    Objective:    Vital Signs:   Temp:  [96.3 F (35.7 C)-97.9 F (36.6 C)] 97.5 F (36.4 C) (10/07 1600) Pulse Rate:  [59-111] 105 (10/07 1830) Resp:  [10-30] 23 (10/07 1830) BP: (73-94)/(50-69) 80/50 mmHg (10/07 1830) SpO2:  [53 %-100 %] 98 % (10/07 1830) Weight:  [77.2 kg (170 lb 3.1 oz)] 77.2 kg (170 lb 3.1 oz) (10/07 0200) Last BM Date: 10/06/15  Weight change: Filed Weights   10/04/15 0415 10/05/15 0115 10/06/15 0200  Weight: 82.9 kg (182 lb 12.2 oz) 81.3 kg (179 lb 3.7 oz) 77.2 kg (170 lb 3.1 oz)    Intake/Output:   Intake/Output Summary (Last 24 hours) at 10/06/15 1856 Last data filed at 10/06/15 1800  Gross per 24 hour  Intake 817.65 ml  Output   1560 ml  Net -742.35 ml     Physical Exam: CVP 13-15 General: extubated. In bed HEENT: normal  Neck: supple. RIJ TLC . LIJ trialysis cath  Cor: PMI laterally displaced. regular + s3 Lungs: clear  anteriorly   Abdomen: soft, nontender, nondistended. No bruits or masses. Good bowel sounds. Extremities: no cyanosis, clubbing, rash, no edema.  Neuro: Awake following commands.   Telemetry: sinus tach. 100-110.   Labs: Basic Metabolic Panel:  Recent Labs Lab 10/01/15 0424  10/04/15 0415 10/04/15 1505 10/05/15 0354 10/05/15 1510 10/06/15 0338 10/06/15 0339 10/06/15 1536  NA 141  < > 140 138 136 139 137  --  138  K 4.8  < > 4.3 4.6 4.4 4.6 4.4  --  4.5  CL 90*  < > 98* 102 96* 101 98*  --  101  CO2 30  < > 29 26 28 28 27   --  26  GLUCOSE 139*  < > 105* 160* 120* 109* 90  --  106*  BUN 168*  < > 87* 73* 41* 34* 26*  --  20  CREATININE 7.11*  < > 3.66* 3.10* 1.94* 1.58* 1.33*  --  1.25*  CALCIUM 6.8*  < > 7.9* 7.7* 8.0* 8.3* 8.2*  --  8.3*  MG 2.7*  --  2.5*  --  2.6*  --   --  2.5*  --   PHOS 10.6*  < > 6.3* 5.6* 3.9 3.4 3.1  --  2.9  < > = values in this interval not displayed.  Liver Function Tests:  Recent Labs Lab 10/01/15 0424 10/02/15 6314 10/03/15 0500  10/04/15 0415 10/04/15 1235  10/05/15 0354 10/05/15 1510 10/06/15 9702 10/06/15 0339 10/06/15  1536  AST 68* 70* 69*  --  66*  --   --   --   --   --  55*  --   ALT 110* 93* 81*  --  85*  --   --   --   --   --  71*  --   ALKPHOS 187* 175* 186*  --  186*  --   --   --   --   --  201*  --   BILITOT 6.4* 7.1* 6.7*  --  7.3* 7.7*  --   --   --   --  7.2*  --   PROT 5.6* 5.5* 5.1*  --  5.7*  --   --   --   --   --  5.4*  --   ALBUMIN 1.9* 1.8* 1.8*  < > 1.9*  --   < > 1.9* 2.0* 1.8* 1.8* 1.9*  < > = values in this interval not displayed. No results for input(s): LIPASE, AMYLASE in the last 168 hours. No results for input(s): AMMONIA in the last 168 hours.  CBC:  Recent Labs Lab 10/03/15 0500 10/04/15 0415 10/04/15 2255 10/05/15 0354 10/06/15 0339  WBC 21.4* 15.7* 15.5* 12.8* 16.7*  NEUTROABS  --  14.9* 14.5*  --   --   HGB 9.5* 9.6* 8.8* 8.7* 8.5*  HCT 29.3* 29.5* 28.3* 27.6* 26.6*  MCV 75.1* 74.3*  76.7* 75.4* 74.7*  PLT 101* 108* 147* 140* 139*    Cardiac Enzymes: No results for input(s): CKTOTAL, CKMB, CKMBINDEX, TROPONINI in the last 168 hours.  BNP: BNP (last 3 results) No results for input(s): BNP in the last 8760 hours.  ProBNP (last 3 results) No results for input(s): PROBNP in the last 8760 hours.   CBG:  Recent Labs Lab 10/05/15 0757 10/05/15 1534 10/05/15 1934 10/05/15 2349 10/06/15 0323  GLUCAP 104* 95 101* 89 82    Coagulation Studies:  Recent Labs  10/05/15 1339  LABPROT 15.8*  INR 1.24     Imaging: Dg Chest Port 1 View  10/05/2015   CLINICAL DATA:  Dyspnea  EXAM: PORTABLE CHEST 1 VIEW  COMPARISON:  October 04, 2015  FINDINGS: The heart size is enlarged. The mediastinal contour is normal. Endotracheal tube is identified distal tip 5.8 cm from carina. Bilateral central venous lines are identified unchanged with the tips in the superior vena cava. There is minimal central pulmonary vascular congestion. There is no focal pneumonia or pleural effusion. The bony structures are stable.  IMPRESSION: Mild central pulmonary vascular congestion decrease compared to prior exam. There is no focal pneumonia or pleural effusion.  Cardiomegaly.   Electronically Signed   By: Abelardo Diesel M.D.   On: 10/05/2015 14:12   Dg Abd Portable 1v  10/05/2015   CLINICAL DATA:  Vomiting  EXAM: PORTABLE ABDOMEN - 1 VIEW  COMPARISON:  October 01, 2015  FINDINGS: A feeding tube is identified with distal tip in the distal stomach. There is relative paucity of gas gas, nonspecific. There is no evidence of free air.  IMPRESSION: Feeding tube distal tip in the distal stomach. Relative paucity of bowel gas, nonspecific.   Electronically Signed   By: Abelardo Diesel M.D.   On: 10/05/2015 14:13     Medications:     Current Medications: . acyclovir  400 mg Per Tube Daily  . antiseptic oral rinse  7 mL Mouth Rinse QID  . chlorhexidine gluconate  15 mL Mouth Rinse BID  .  pantoprazole  (PROTONIX) IV  40 mg Intravenous Q12H    Infusions: . sodium chloride 10 mL/hr at 10/06/15 0000  . DOBUTamine 7.5 mcg/kg/min (10/06/15 1143)  . heparin 10,000 units/ 20 mL infusion syringe 2,500 Units/hr (10/06/15 1717)  . norepinephrine (LEVOPHED) Adult infusion 6 mcg/min (10/06/15 0610)  . dialysis replacement fluid (prismasate) 800 mL/hr at 10/06/15 1721  . dialysis replacement fluid (prismasate) 700 mL/hr at 10/06/15 1749  . dialysate (PRISMASATE) 1,500 mL/hr at 10/06/15 1831     Assessment:   1. Cardiogenic shock 2. Acute systolic HF EF 82-50%, etiology uncertain.  Brother has dilated cardiomyopathy so possibly familial.  Reports drinking 6 beers/day long-term.  3. Acute respiratory failure 4. Multisystem organ failure 5. Acute kidney injury 6. Shock liver 7. Hyperkalemia 8. Cervical lymphadenopathy - path appears to be Hodgkins lymphoma. Adcetria given 9/30    --s/p core biopsy 9/27 9. Tobacco use 10. ETOH use - 4+ beers per day 11. Family h/o NICM in brother 56. Massive upper GI bleed     --severe esophagitis by EGD on 9/27 13. Coagulopathy - due to shock liver with ? DIC 14. ?CAP  Plan/Discussion:     Remains hemodynamically tenuous despite dual pressors. Co-ox low again this am. Dobutamine increased. Remains on norepi. Cannot use milrinone due to ESRD. Can try to wean norepi slowly but will need to follow co-ox closely.   Will plan to repeat echo tomorrow to exclude effusion or other complicating factor.   Continue supportive care as the oncology team treats his malignancy. Prognosis very guarded but we will continue to take one day at a time and hopefully he will begin to get some cardiac recovery.  D/w Dr. Titus Mould.   The patient is critically ill with multiple organ systems failure and requires high complexity decision making for assessment and support, frequent evaluation and titration of therapies, application of advanced monitoring technologies and  extensive interpretation of multiple databases.   Critical Care Time devoted to patient care services described in this note is 35 Minutes.  Bensimhon, Daniel,MD 6:56 PM Advanced Heart Failure Team Pager [phone] (M-F; 7a - 4p)  Please contact [facility] for night-coverage after hours (4p -7a ) and weekends on [email or web address]

## 2015-10-07 ENCOUNTER — Inpatient Hospital Stay (HOSPITAL_COMMUNITY): Payer: BLUE CROSS/BLUE SHIELD

## 2015-10-07 DIAGNOSIS — I509 Heart failure, unspecified: Secondary | ICD-10-CM

## 2015-10-07 LAB — COMPREHENSIVE METABOLIC PANEL
ALBUMIN: 1.8 g/dL — AB (ref 3.5–5.0)
ALK PHOS: 196 U/L — AB (ref 38–126)
ALT: 64 U/L — ABNORMAL HIGH (ref 17–63)
ANION GAP: 10 (ref 5–15)
AST: 52 U/L — ABNORMAL HIGH (ref 15–41)
BILIRUBIN TOTAL: 7.6 mg/dL — AB (ref 0.3–1.2)
BUN: 16 mg/dL (ref 6–20)
CALCIUM: 8.2 mg/dL — AB (ref 8.9–10.3)
CO2: 27 mmol/L (ref 22–32)
Chloride: 102 mmol/L (ref 101–111)
Creatinine, Ser: 1.15 mg/dL (ref 0.61–1.24)
GFR calc non Af Amer: >60 mL/min (ref >60)
GLUCOSE: 97 mg/dL (ref 65–99)
POTASSIUM: 4.5 mmol/L (ref 3.5–5.1)
SODIUM: 139 mmol/L (ref 135–145)
TOTAL PROTEIN: 5.5 g/dL — AB (ref 6.5–8.1)

## 2015-10-07 LAB — PHOSPHORUS: PHOSPHORUS: 2.6 mg/dL (ref 2.5–4.6)

## 2015-10-07 LAB — RENAL FUNCTION PANEL
ALBUMIN: 1.8 g/dL — AB (ref 3.5–5.0)
ANION GAP: 8 (ref 5–15)
BUN: 14 mg/dL (ref 6–20)
CALCIUM: 7.9 mg/dL — AB (ref 8.9–10.3)
CO2: 28 mmol/L (ref 22–32)
CREATININE: 1.15 mg/dL (ref 0.61–1.24)
Chloride: 99 mmol/L — ABNORMAL LOW (ref 101–111)
Glucose, Bld: 110 mg/dL — ABNORMAL HIGH (ref 65–99)
PHOSPHORUS: 2.4 mg/dL — AB (ref 2.5–4.6)
Potassium: 4.4 mmol/L (ref 3.5–5.1)
SODIUM: 135 mmol/L (ref 135–145)

## 2015-10-07 LAB — CARBOXYHEMOGLOBIN
CARBOXYHEMOGLOBIN: 2.1 % — AB (ref 0.5–1.5)
Carboxyhemoglobin: 1.8 % — ABNORMAL HIGH (ref 0.5–1.5)
Methemoglobin: 0.9 % (ref 0.0–1.5)
Methemoglobin: 1.1 % (ref 0.0–1.5)
O2 SAT: 47.4 %
O2 Saturation: 59 %
TOTAL HEMOGLOBIN: 8.1 g/dL — AB (ref 13.5–18.0)
Total hemoglobin: 5.7 g/dL — CL (ref 13.5–18.0)

## 2015-10-07 LAB — CBC
HEMATOCRIT: 25.4 % — AB (ref 39.0–52.0)
HEMOGLOBIN: 8.2 g/dL — AB (ref 13.0–17.0)
MCH: 24.1 pg — ABNORMAL LOW (ref 26.0–34.0)
MCHC: 32.3 g/dL (ref 30.0–36.0)
MCV: 74.7 fL — ABNORMAL LOW (ref 78.0–100.0)
Platelets: 136 10*3/uL — ABNORMAL LOW (ref 150–400)
RBC: 3.4 MIL/uL — AB (ref 4.22–5.81)
RDW: 18.1 % — ABNORMAL HIGH (ref 11.5–15.5)
WBC: 14.2 10*3/uL — AB (ref 4.0–10.5)

## 2015-10-07 LAB — GLUCOSE, CAPILLARY
GLUCOSE-CAPILLARY: 99 mg/dL (ref 65–99)
GLUCOSE-CAPILLARY: 103 mg/dL — AB (ref 65–99)
GLUCOSE-CAPILLARY: 110 mg/dL — AB (ref 65–99)
Glucose-Capillary: 93 mg/dL (ref 65–99)

## 2015-10-07 LAB — APTT: APTT: 109 s — AB (ref 24–37)

## 2015-10-07 LAB — POCT ACTIVATED CLOTTING TIME
ACTIVATED CLOTTING TIME: 165 s
ACTIVATED CLOTTING TIME: 171 s
ACTIVATED CLOTTING TIME: 177 s
Activated Clotting Time: 165 seconds

## 2015-10-07 LAB — MAGNESIUM: Magnesium: 2.7 mg/dL — ABNORMAL HIGH (ref 1.7–2.4)

## 2015-10-07 LAB — CG4 I-STAT (LACTIC ACID): Lactic Acid, Venous: 1.12 mmol/L (ref 0.5–2.0)

## 2015-10-07 MED ORDER — CETYLPYRIDINIUM CHLORIDE 0.05 % MT LIQD
7.0000 mL | Freq: Two times a day (BID) | OROMUCOSAL | Status: DC
  Administered 2015-10-07 – 2015-10-13 (×13): 7 mL via OROMUCOSAL

## 2015-10-07 NOTE — Progress Notes (Signed)
Subjective: Interval History: has no complaint .  Objective: Vital signs in last 24 hours: Temp:  [96.8 F (36 C)-98 F (36.7 C)] 98 F (36.7 C) (10/08 0340) Pulse Rate:  [34-112] 101 (10/08 0700) Resp:  [11-30] 19 (10/08 0700) BP: (73-92)/(50-67) 87/60 mmHg (10/08 0700) SpO2:  [53 %-100 %] 100 % (10/08 0700) Weight change:   Intake/Output from previous day: 10/07 0701 - 10/08 0700 In: 835.6 [P.O.:60; I.V.:575.6; NG/GT:200] Out: 1490 [Emesis/NG output:250] Intake/Output this shift:    General appearance: cooperative and slowed mentation Neck: IJ cath Resp: diminished breath sounds bibasilar Cardio: S1, S2 normal and systolic murmur: holosystolic 2/6, blowing at apex GI: pos bs, but decreased Extremities: edema 1+  Lab Results:  Recent Labs  10/06/15 0339 10/07/15 0402  WBC 16.7* 14.2*  HGB 8.5* 8.2*  HCT 26.6* 25.4*  PLT 139* 136*   BMET:  Recent Labs  10/06/15 1536 10/07/15 0402  NA 138 139  K 4.5 4.5  CL 101 102  CO2 26 27  GLUCOSE 106* 97  BUN 20 16  CREATININE 1.25* 1.15  CALCIUM 8.3* 8.2*   No results for input(s): PTH in the last 72 hours. Iron Studies: No results for input(s): IRON, TIBC, TRANSFERRIN, FERRITIN in the last 72 hours.  Studies/Results: Dg Chest Port 1 View  10/05/2015   CLINICAL DATA:  Dyspnea  EXAM: PORTABLE CHEST 1 VIEW  COMPARISON:  October 04, 2015  FINDINGS: The heart size is enlarged. The mediastinal contour is normal. Endotracheal tube is identified distal tip 5.8 cm from carina. Bilateral central venous lines are identified unchanged with the tips in the superior vena cava. There is minimal central pulmonary vascular congestion. There is no focal pneumonia or pleural effusion. The bony structures are stable.  IMPRESSION: Mild central pulmonary vascular congestion decrease compared to prior exam. There is no focal pneumonia or pleural effusion.  Cardiomegaly.   Electronically Signed   By: Abelardo Diesel M.D.   On: 10/05/2015 14:12    Dg Abd Portable 1v  10/05/2015   CLINICAL DATA:  Vomiting  EXAM: PORTABLE ABDOMEN - 1 VIEW  COMPARISON:  October 01, 2015  FINDINGS: A feeding tube is identified with distal tip in the distal stomach. There is relative paucity of gas gas, nonspecific. There is no evidence of free air.  IMPRESSION: Feeding tube distal tip in the distal stomach. Relative paucity of bowel gas, nonspecific.   Electronically Signed   By: Abelardo Diesel M.D.   On: 10/05/2015 14:13    I have reviewed the patient's current medications.  Assessment/Plan: 1 AKI oliguric .  Acid/base/K ok. Vol ok.  bp stable.  2 Hypotension  3 Hodgkins on meds 4 CM suspect is primary cause of low bp 5 sepsis resolved 6 Pneu resolved P CRRt, low vol off, will look at conversion to IHD.  Wean NE , tol bps in 80s.    LOS: 13 days   Paul Morton 10/07/2015,7:31 AM

## 2015-10-07 NOTE — Progress Notes (Signed)
PULMONARY / CRITICAL CARE MEDICINE   Name: Paul Morton MRN: [id number] DOB: 1961/05/03    ADMISSION DATE:  09/14/2015  REFERRING MD :  EDP  CHIEF COMPLAINT:  Sepsis   INITIAL PRESENTATION:  54yo male smoker with recent diagnosis lymphoma (still in early stages of workup) who presented 9/25 with progressive back pain.  In ER found to be tachycardic, hypotensive with lactate 14, AKI and coagulopathy.  PCCM called to admit.    STUDIES:  PET 9/22 - Bulky confluent hypermetabolic left cervical lymphadenopathy involving levels II-V. Given the presence of hepatomegaly with diffuse liver hypermetabolism, a diagnosis of lymphoma is favored, however metastatic carcinoma remains on the differential.  Hypermetabolic patchy ground-glass opacity and consolidation in both upper lobes and right lower lobe, favor an infectious or inflammatory etiology.  TTE 9/26 - EF 10-15% w/o regional wall motion abnormality. Grade 3 diastolic dysfunction. Moderate AR. Moderate MR. RV severely dilated w/ moderate reduction in systolic function. PASP 43 mmHg. No pericardial effusion.  RLE Venous Duplex 9/26 - no DVT or SVT  MRI spine 9/25 - incomplete exam, No evidence of significant spinal stenosis or cord compression in the cervical or visualized thoracic spine.   MRI L-spine 9/27 - suboptimal exam. No malignant or suspicious signal.  SIGNIFICANT EVENTS: 9/25 - Admitted to hospital 9/25 - Right IJ CVC by ED 9/26 - Intubated for pending respiratory failure & transferred to [facility] 9/27 - EGD w/ erosive esophagitis 9/27 - FNA neck mass 9/30 - Self extubation during SBT 10/1 - Reintubated  10/4 cvvhd started 10/5- hd cathter out, new placed, cvvhd restarted 10/6- extubated 10/7 - not  Responding to dobutamine  SUBJECTIVE:  Remains without distress, neg balance on cvvhd without increase pressors  VITAL SIGNS: Temp:  [96.8 F (36 C)-98 F (36.7 C)] 97.4 F (36.3 C) (10/08 0828) Pulse Rate:  [34-112] 101  (10/08 0700) Resp:  [11-30] 19 (10/08 0700) BP: (73-92)/(50-67) 87/60 mmHg (10/08 0700) SpO2:  [53 %-100 %] 100 % (10/08 0700) Weight:  [75.2 kg (165 lb 12.6 oz)] 75.2 kg (165 lb 12.6 oz) (10/08 0700) HEMODYNAMICS: CVP:  [11 mmHg] 11 mmHg VENTILATOR SETTINGS:   INTAKE / OUTPUT:  Intake/Output Summary (Last 24 hours) at 10/07/15 1006 Last data filed at 10/07/15 0700  Gross per 24 hour  Intake  718.4 ml  Output   1394 ml  Net -675.6 ml    PHYSICAL EXAMINATION: General: awake, o x 3 Integument: no rashes HEENT: no distress, jvd remains, lymphad same rt >left neck Cardiovascular:  s1 s2 rrt no m Pulmonary: CTA, reduced bases Abdomen: Soft, non tender Neurological: A O x 3  LABS:  CBC  Recent Labs Lab 10/05/15 0354 10/06/15 0339 10/07/15 0402  WBC 12.8* 16.7* 14.2*  HGB 8.7* 8.5* 8.2*  HCT 27.6* 26.6* 25.4*  PLT 140* 139* 136*   Coag's  Recent Labs Lab 10/02/15 0438 10/03/15 0500  10/05/15 1339 10/06/15 0339 10/07/15 0402  APTT 30  --   < > 44* 99* 109*  INR 1.49 1.42  --  1.24  --   --   < > = values in this interval not displayed. BMET  Recent Labs Lab 10/06/15 0338 10/06/15 1536 10/07/15 0402  NA 137 138 139  K 4.4 4.5 4.5  CL 98* 101 102  CO2 27 26 27   BUN 26* 20 16  CREATININE 1.33* 1.25* 1.15  GLUCOSE 90 106* 97   Electrolytes  Recent Labs Lab 10/05/15 0354  10/06/15 0338 10/06/15 1275  10/06/15 1536 10/07/15 0402  CALCIUM 8.0*  < > 8.2*  --  8.3* 8.2*  MG 2.6*  --   --  2.5*  --  2.7*  PHOS 3.9  < > 3.1  --  2.9 2.6  < > = values in this interval not displayed.   Sepsis Markers  Recent Labs Lab 09/30/15 1519 10/01/15 0424 10/02/15 0438  PROCALCITON 11.80 11.78 10.90   ABG  Recent Labs Lab 09/30/15 1445 09/30/15 1620 10/02/15 0420  PHART 7.539* 7.491* 7.453*  PCO2ART 31.1* 37.3 41.7  PO2ART 106* 129* 170*   Liver Enzymes  Recent Labs Lab 10/04/15 0415 10/04/15 1235  10/06/15 0339 10/06/15 1536 10/07/15 0402   AST 66*  --   --  55*  --  52*  ALT 85*  --   --  71*  --  64*  ALKPHOS 186*  --   --  201*  --  196*  BILITOT 7.3* 7.7*  --  7.2*  --  7.6*  ALBUMIN 1.9*  --   < > 1.8* 1.9* 1.8*  < > = values in this interval not displayed. Cardiac Enzymes No results for input(s): TROPONINI, PROBNP in the last 168 hours. Glucose  Recent Labs Lab 10/05/15 0757 10/05/15 1534 10/05/15 1934 10/05/15 2349 10/06/15 0323 10/07/15 0838  GLUCAP 104* 95 101* 89 82 99    Imaging Dg Chest Port 1 View  10/07/2015   CLINICAL DATA:  Acute respiratory failure.  EXAM: PORTABLE CHEST 1 VIEW  COMPARISON:  10/05/2015 and 10/04/2015  FINDINGS: Two central lines and feeding tube are in place and appear in good position. Marked cardiomegaly. New hazy bilateral pulmonary infiltrates with pulmonary vascular congestion. The findings could represent pulmonary edema or pneumonia.  IMPRESSION: New hazy bilateral infiltrates worrisome for either pulmonary edema or pneumonia.   Electronically Signed   By: Lorriane Shire M.D.   On: 10/07/2015 08:35    ASSESSMENT / PLAN:  PULMONARY ETT 9/26>>>9/30 (self extubated)>10/1  A: Acute hypoxic respiratory failure 2nd to HCAP with ARDS >> less likely pulmonary edema. Hx of tobacco abuse. R/o lymphangitic spread lymphangitic spread? 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 Shock - combination of sepsis and cardiac. R/o burn out adrenal gland Poor response dobutmaine P:  MAp 60 Levo titrate down if able Dobutamine reduction , svo2 repeat Echo repeat per cards  RENAL A: Acute Renal Failure >> likely from CHF, sepsis, and diuresis >> creatinine rise again noted P:   cvvhd successful neg balance, maintain  GASTROINTESTINAL A: Shock liver - improving. Upper GIB - secondary to erosive esophagitis on EGD. Protein calorie  malnutrition. R/o ileus hyperbili (lymphoma ), cholestasis likely, shock liver component  Loose stools, prior cdiff neg P:   ppi Slp, passed , advance to renal diet flexi seal, imodium   HEMATOLOGIC A: New dx of Hodgkin's lymphoma Coagulopathy >> resolved. Anemia of critical illness, and GI bleed. Thrombocytopenia. P:  SCDs Chemotherapy per onc, tolerated first treatment  INFECTIOUS A: Resolved hcap P:   Vancomycin 9/25>>>off Zosyn 9/25>>>full course completed Zovirax for prophylaxis  Sputum 10/01 >>candida   ENDOCRINE A: Hyperglycemia. P:   SSI if CBG > 180 Controlled, follow on diet  NEUROLOGIC A: Acute metabolic encephalopathy resolved P:   RASS goal 0 Need aggressive PT     Ccm time 30 mi n  Lavon Paganini. Titus Mould, MD, [place] Pgr: [facility]

## 2015-10-07 NOTE — Progress Notes (Signed)
Advanced Heart Failure Team Rounding Note  Subjective:  Remains on CVVHD and dobutamine and norepi.. Remains anuric (<10cc/hr). Dobutamine increased to 7.5 by CCM yesterday in order to try and wean norepi which is now at 6 mcg/min. Co-ox this am 44%.  CVP remains 11    Objective:    Vital Signs:   Temp:  [96.8 F (36 C)-98 F (36.7 C)] 97.4 F (36.3 C) (10/08 0828) Pulse Rate:  [34-112] 101 (10/08 0700) Resp:  [11-30] 19 (10/08 0700) BP: (73-92)/(50-67) 87/60 mmHg (10/08 0700) SpO2:  [53 %-100 %] 100 % (10/08 0700) Weight:  [75.2 kg (165 lb 12.6 oz)] 75.2 kg (165 lb 12.6 oz) (10/08 0700) Last BM Date: 10/06/15  Weight change: Filed Weights   10/05/15 0115 10/06/15 0200 10/07/15 0700  Weight: 81.3 kg (179 lb 3.7 oz) 77.2 kg (170 lb 3.1 oz) 75.2 kg (165 lb 12.6 oz)    Intake/Output:   Intake/Output Summary (Last 24 hours) at 10/07/15 0900 Last data filed at 10/07/15 0700  Gross per 24 hour  Intake  740.8 ml  Output   1441 ml  Net -700.2 ml     Physical Exam: CVP 11 General: extubated. In bed. Weak appearing HEENT: normal  Neck: supple. RIJ TLC . LIJ trialysis cath  Cor: PMI laterally displaced. regular + s3 Lungs: clear anteriorly   Abdomen: soft, nontender, nondistended. No bruits or masses. Good bowel sounds. Extremities: no cyanosis, clubbing, rash, no edema.  Neuro: Awake following commands.   Telemetry: sinus tach. 100-110.   Labs: Basic Metabolic Panel:  Recent Labs Lab 10/01/15 0424  10/04/15 0415  10/05/15 0354 10/05/15 1510 10/06/15 0338 10/06/15 0339 10/06/15 1536 10/07/15 0402  NA 141  < > 140  < > 136 139 137  --  138 139  K 4.8  < > 4.3  < > 4.4 4.6 4.4  --  4.5 4.5  CL 90*  < > 98*  < > 96* 101 98*  --  101 102  CO2 30  < > 29  < > 28 28 27   --  26 27  GLUCOSE 139*  < > 105*  < > 120* 109* 90  --  106* 97  BUN 168*  < > 87*  < > 41* 34* 26*  --  20 16  CREATININE 7.11*  < > 3.66*  < > 1.94* 1.58* 1.33*  --  1.25* 1.15  CALCIUM 6.8*   < > 7.9*  < > 8.0* 8.3* 8.2*  --  8.3* 8.2*  MG 2.7*  --  2.5*  --  2.6*  --   --  2.5*  --  2.7*  PHOS 10.6*  < > 6.3*  < > 3.9 3.4 3.1  --  2.9 2.6  < > = values in this interval not displayed.  Liver Function Tests:  Recent Labs Lab 10/02/15 0438 10/03/15 0500  10/04/15 0415 10/04/15 1235  10/05/15 1510 10/06/15 0338 10/06/15 0339 10/06/15 1536 10/07/15 0402  AST 70* 69*  --  66*  --   --   --   --  55*  --  52*  ALT 93* 81*  --  85*  --   --   --   --  71*  --  64*  ALKPHOS 175* 186*  --  186*  --   --   --   --  201*  --  196*  BILITOT 7.1* 6.7*  --  7.3* 7.7*  --   --   --  7.2*  --  7.6*  PROT 5.5* 5.1*  --  5.7*  --   --   --   --  5.4*  --  5.5*  ALBUMIN 1.8* 1.8*  < > 1.9*  --   < > 2.0* 1.8* 1.8* 1.9* 1.8*  < > = values in this interval not displayed. No results for input(s): LIPASE, AMYLASE in the last 168 hours. No results for input(s): AMMONIA in the last 168 hours.  CBC:  Recent Labs Lab 10/04/15 0415 10/04/15 2255 10/05/15 0354 10/06/15 0339 10/07/15 0402  WBC 15.7* 15.5* 12.8* 16.7* 14.2*  NEUTROABS 14.9* 14.5*  --   --   --   HGB 9.6* 8.8* 8.7* 8.5* 8.2*  HCT 29.5* 28.3* 27.6* 26.6* 25.4*  MCV 74.3* 76.7* 75.4* 74.7* 74.7*  PLT 108* 147* 140* 139* 136*    Cardiac Enzymes: No results for input(s): CKTOTAL, CKMB, CKMBINDEX, TROPONINI in the last 168 hours.  BNP: BNP (last 3 results) No results for input(s): BNP in the last 8760 hours.  ProBNP (last 3 results) No results for input(s): PROBNP in the last 8760 hours.   CBG:  Recent Labs Lab 10/05/15 0757 10/05/15 1534 10/05/15 1934 10/05/15 2349 10/06/15 0323  GLUCAP 104* 95 101* 89 82    Coagulation Studies:  Recent Labs  10/05/15 1339  LABPROT 15.8*  INR 1.24     Imaging: Dg Chest Port 1 View  10/07/2015   CLINICAL DATA:  Acute respiratory failure.  EXAM: PORTABLE CHEST 1 VIEW  COMPARISON:  10/05/2015 and 10/04/2015  FINDINGS: Two central lines and feeding tube are in  place and appear in good position. Marked cardiomegaly. New hazy bilateral pulmonary infiltrates with pulmonary vascular congestion. The findings could represent pulmonary edema or pneumonia.  IMPRESSION: New hazy bilateral infiltrates worrisome for either pulmonary edema or pneumonia.   Electronically Signed   By: Lorriane Shire M.D.   On: 10/07/2015 08:35   Dg Chest Port 1 View  10/05/2015   CLINICAL DATA:  Dyspnea  EXAM: PORTABLE CHEST 1 VIEW  COMPARISON:  October 04, 2015  FINDINGS: The heart size is enlarged. The mediastinal contour is normal. Endotracheal tube is identified distal tip 5.8 cm from carina. Bilateral central venous lines are identified unchanged with the tips in the superior vena cava. There is minimal central pulmonary vascular congestion. There is no focal pneumonia or pleural effusion. The bony structures are stable.  IMPRESSION: Mild central pulmonary vascular congestion decrease compared to prior exam. There is no focal pneumonia or pleural effusion.  Cardiomegaly.   Electronically Signed   By: Abelardo Diesel M.D.   On: 10/05/2015 14:12   Dg Abd Portable 1v  10/05/2015   CLINICAL DATA:  Vomiting  EXAM: PORTABLE ABDOMEN - 1 VIEW  COMPARISON:  October 01, 2015  FINDINGS: A feeding tube is identified with distal tip in the distal stomach. There is relative paucity of gas gas, nonspecific. There is no evidence of free air.  IMPRESSION: Feeding tube distal tip in the distal stomach. Relative paucity of bowel gas, nonspecific.   Electronically Signed   By: Abelardo Diesel M.D.   On: 10/05/2015 14:13     Medications:     Current Medications: . acyclovir  400 mg Per Tube Daily  . antiseptic oral rinse  7 mL Mouth Rinse BID  . pantoprazole (PROTONIX) IV  40 mg Intravenous Q12H    Infusions: . sodium chloride 10 mL/hr at 10/06/15 2000  . DOBUTamine 7.5 mcg/kg/min (10/06/15 2000)  .  heparin 10,000 units/ 20 mL infusion syringe 2,500 Units/hr (10/07/15 0453)  . norepinephrine  (LEVOPHED) Adult infusion 5.973 mcg/min (10/06/15 2000)  . dialysis replacement fluid (prismasate) 800 mL/hr at 10/07/15 0624  . dialysis replacement fluid (prismasate) 700 mL/hr at 10/07/15 0115  . dialysate (PRISMASATE) 1,500 mL/hr at 10/07/15 0500     Assessment:   1. Cardiogenic shock 2. Acute systolic HF EF 50-27%, etiology uncertain.  Brother has dilated cardiomyopathy so possibly familial.  Reports drinking 6 beers/day long-term.  3. Acute respiratory failure 4. Multisystem organ failure 5. Acute kidney injury 6. Shock liver 7. Hyperkalemia 8. Cervical lymphadenopathy - path appears to be Hodgkins lymphoma. Adcetria given 9/30    --s/p core biopsy 9/27 9. Tobacco use 10. ETOH use - 4+ beers per day 11. Family h/o NICM in brother 17. Massive upper GI bleed     --severe esophagitis by EGD on 9/27 13. Coagulopathy - due to shock liver with ? DIC 14. ?CAP  Plan/Discussion:     Remains hemodynamically tenuous despite dual pressors. Co-ox low again this am despite increase in doubtamine.  Remains on norepi. Cannot use milrinone due to ESRD. Can try to wean norepi slowly but will need to follow co-ox closely as current MV sat is c/w ongoing low output.    Will plan to repeat echo today to exclude effusion or other complicating factor.   Continue supportive care as the oncology team treats his malignancy. Prognosis very guarded but we will continue to take one day at a time and hopefully he will begin to get some cardiac recovery.  The patient is critically ill with multiple organ systems failure and requires high complexity decision making for assessment and support, frequent evaluation and titration of therapies, application of advanced monitoring technologies and extensive interpretation of multiple databases.   Critical Care Time devoted to patient care services described in this note is 35 Minutes.  Paul Milan,MD 9:00 AM Advanced Heart Failure Team Pager [phone]  (M-F; 7a - 4p)  Please contact [facility] for night-coverage after hours (4p -7a ) and weekends on [email or web address]

## 2015-10-07 NOTE — Progress Notes (Signed)
Echocardiogram 2D Echocardiogram limited has been performed.  Paul Morton 10/07/2015, 2:12 PM

## 2015-10-07 NOTE — Progress Notes (Signed)
PT Cancellation Note  Patient Details Name: Paul Morton MRN: [id number] DOB: Nov 02, 1961   Cancelled Treatment:    Reason Eval/Treat Not Completed: Patient not medically ready. Continues to be on CVVHD. Will check back as schedule allows to complete PT eval after pt is transitioned to IHD.    Rolinda Roan 10/07/2015, 9:45 AM   Rolinda Roan, PT, DPT Acute Rehabilitation Services Pager: [phone]

## 2015-10-08 ENCOUNTER — Inpatient Hospital Stay (HOSPITAL_COMMUNITY): Payer: BLUE CROSS/BLUE SHIELD

## 2015-10-08 DIAGNOSIS — N178 Other acute kidney failure: Secondary | ICD-10-CM

## 2015-10-08 LAB — RENAL FUNCTION PANEL
ALBUMIN: 1.8 g/dL — AB (ref 3.5–5.0)
ANION GAP: 11 (ref 5–15)
ANION GAP: 9 (ref 5–15)
Albumin: 1.8 g/dL — ABNORMAL LOW (ref 3.5–5.0)
BUN: 15 mg/dL (ref 6–20)
BUN: 14 mg/dL (ref 6–20)
CALCIUM: 7.8 mg/dL — AB (ref 8.9–10.3)
CHLORIDE: 99 mmol/L — AB (ref 101–111)
CHLORIDE: 98 mmol/L — AB (ref 101–111)
CO2: 27 mmol/L (ref 22–32)
CO2: 28 mmol/L (ref 22–32)
Calcium: 8 mg/dL — ABNORMAL LOW (ref 8.9–10.3)
Creatinine, Ser: 1.02 mg/dL (ref 0.61–1.24)
Creatinine, Ser: 1.05 mg/dL (ref 0.61–1.24)
GFR calc Af Amer: >60 mL/min (ref >60)
GFR calc Af Amer: >60 mL/min (ref >60)
GFR calc non Af Amer: >60 mL/min (ref >60)
GLUCOSE: 116 mg/dL — AB (ref 65–99)
Glucose, Bld: 96 mg/dL (ref 65–99)
PHOSPHORUS: 2.4 mg/dL — AB (ref 2.5–4.6)
POTASSIUM: 4.4 mmol/L (ref 3.5–5.1)
Phosphorus: 3.6 mg/dL (ref 2.5–4.6)
Potassium: 4.6 mmol/L (ref 3.5–5.1)
SODIUM: 135 mmol/L (ref 135–145)
Sodium: 137 mmol/L (ref 135–145)

## 2015-10-08 LAB — POCT ACTIVATED CLOTTING TIME
ACTIVATED CLOTTING TIME: 171 s
Activated Clotting Time: 165 seconds
Activated Clotting Time: 171 seconds
Activated Clotting Time: 171 seconds

## 2015-10-08 LAB — APTT: APTT: 86 s — AB (ref 24–37)

## 2015-10-08 LAB — HEPATIC FUNCTION PANEL
ALBUMIN: 1.8 g/dL — AB (ref 3.5–5.0)
ALT: 61 U/L (ref 17–63)
AST: 57 U/L — AB (ref 15–41)
Alkaline Phosphatase: 198 U/L — ABNORMAL HIGH (ref 38–126)
Bilirubin, Direct: 5.6 mg/dL — ABNORMAL HIGH (ref 0.1–0.5)
Indirect Bilirubin: 3 mg/dL — ABNORMAL HIGH (ref 0.3–0.9)
TOTAL PROTEIN: 5.6 g/dL — AB (ref 6.5–8.1)
Total Bilirubin: 8.6 mg/dL — ABNORMAL HIGH (ref 0.3–1.2)

## 2015-10-08 LAB — GLUCOSE, CAPILLARY
GLUCOSE-CAPILLARY: 104 mg/dL — AB (ref 65–99)
GLUCOSE-CAPILLARY: 109 mg/dL — AB (ref 65–99)
GLUCOSE-CAPILLARY: 111 mg/dL — AB (ref 65–99)
GLUCOSE-CAPILLARY: 90 mg/dL (ref 65–99)
GLUCOSE-CAPILLARY: 90 mg/dL (ref 65–99)
GLUCOSE-CAPILLARY: 93 mg/dL (ref 65–99)

## 2015-10-08 LAB — HEMOGLOBIN AND HEMATOCRIT, BLOOD
HCT: 26.5 % — ABNORMAL LOW (ref 39.0–52.0)
HEMOGLOBIN: 8.2 g/dL — AB (ref 13.0–17.0)

## 2015-10-08 LAB — MAGNESIUM: MAGNESIUM: 2.5 mg/dL — AB (ref 1.7–2.4)

## 2015-10-08 LAB — OCCULT BLOOD X 1 CARD TO LAB, STOOL: Fecal Occult Bld: POSITIVE — AB

## 2015-10-08 MED ORDER — LOPERAMIDE HCL 2 MG PO CAPS
4.0000 mg | ORAL_CAPSULE | Freq: Four times a day (QID) | ORAL | Status: DC | PRN
  Administered 2015-10-08 – 2015-10-09 (×4): 4 mg via ORAL
  Filled 2015-10-08 (×5): qty 2

## 2015-10-08 MED ORDER — HYDROCORTISONE NA SUCCINATE PF 100 MG IJ SOLR
50.0000 mg | Freq: Four times a day (QID) | INTRAMUSCULAR | Status: DC
  Administered 2015-10-08 – 2015-10-10 (×9): 50 mg via INTRAVENOUS
  Filled 2015-10-08: qty 1
  Filled 2015-10-08: qty 2
  Filled 2015-10-08: qty 1
  Filled 2015-10-08: qty 2
  Filled 2015-10-08 (×2): qty 1
  Filled 2015-10-08: qty 2
  Filled 2015-10-08: qty 1
  Filled 2015-10-08: qty 2
  Filled 2015-10-08 (×3): qty 1

## 2015-10-08 MED ORDER — SODIUM PHOSPHATE 3 MMOLE/ML IV SOLN
30.0000 mmol | Freq: Once | INTRAVENOUS | Status: AC
  Administered 2015-10-08: 30 mmol via INTRAVENOUS
  Filled 2015-10-08: qty 10

## 2015-10-08 NOTE — Progress Notes (Signed)
Subjective: Interval History: has no complaint.  Objective: Vital signs in last 24 hours: Temp:  [97.3 F (36.3 C)-98 F (36.7 C)] 98 F (36.7 C) (10/09 0410) Pulse Rate:  [106-116] 110 (10/09 0700) Resp:  [14-27] 14 (10/09 0700) BP: (77-104)/(33-69) 92/54 mmHg (10/09 0700) SpO2:  [99 %-100 %] 100 % (10/09 0700) Weight:  [74 kg (163 lb 2.3 oz)] 74 kg (163 lb 2.3 oz) (10/09 0400) Weight change: -1.2 kg (-2 lb 10.3 oz)  Intake/Output from previous day: 10/08 0701 - 10/09 0700 In: 508.8 [P.O.:30; I.V.:478.8] Out: 1515 [Urine:250; Emesis/NG output:100] Intake/Output this shift:    General appearance: alert, cooperative and pale Neck: L IJ cath Resp: diminished breath sounds bilaterally and rales bibasilar Cardio: S1, S2 normal and systolic murmur: holosystolic 2/6, blowing at apex GI: pos bs, soft Extremities: edema 2+  Lab Results:  Recent Labs  10/06/15 0339 10/07/15 0402  WBC 16.7* 14.2*  HGB 8.5* 8.2*  HCT 26.6* 25.4*  PLT 139* 136*   BMET:  Recent Labs  10/07/15 1657 10/08/15 0409  NA 135 137  K 4.4 4.4  CL 99* 99*  CO2 28 27  GLUCOSE 110* 96  BUN 14 15  CREATININE 1.15 1.02  CALCIUM 7.9* 8.0*   No results for input(s): PTH in the last 72 hours. Iron Studies: No results for input(s): IRON, TIBC, TRANSFERRIN, FERRITIN in the last 72 hours.  Studies/Results: Dg Chest Port 1 View  10/07/2015   CLINICAL DATA:  Acute respiratory failure.  EXAM: PORTABLE CHEST 1 VIEW  COMPARISON:  10/05/2015 and 10/04/2015  FINDINGS: Two central lines and feeding tube are in place and appear in good position. Marked cardiomegaly. New hazy bilateral pulmonary infiltrates with pulmonary vascular congestion. The findings could represent pulmonary edema or pneumonia.  IMPRESSION: New hazy bilateral infiltrates worrisome for either pulmonary edema or pneumonia.   Electronically Signed   By: Lorriane Shire M.D.   On: 10/07/2015 08:35    I have reviewed the patient's current  medications.  Assessment/Plan: 1 AKI some urine but not much.  Vol xs. Solute/acid/base/K ok.  BP still low on NE/inotrope May need to risk IHD if not better soon to see if can progress 2 VDRF better 3 CM primary issue   4 Liver injury improving, follow 5 Anemia  6 Hodgkins P CRRT, neg 24, inotropes,see if can come off NE,     LOS: 14 days   Caralee Morea L 10/08/2015,7:45 AM

## 2015-10-08 NOTE — Progress Notes (Signed)
Advanced Heart Failure Team Rounding Note  Subjective:  Remains on CVVHD but urine output picking up slowly. CCM weaning dobutamine. SBP 80-90. Remains on norepi. CVP 10.   Repeat echo 10/5. LVEF 10-15%. RV ok. No effusion .    Objective:    Vital Signs:   Temp:  [97.3 F (36.3 C)-98 F (36.7 C)] 97.7 F (36.5 C) (10/09 0809) Pulse Rate:  [106-116] 109 (10/09 0900) Resp:  [14-27] 18 (10/09 0900) BP: (77-104)/(33-69) 88/63 mmHg (10/09 0900) SpO2:  [99 %-100 %] 100 % (10/09 0900) Weight:  [74 kg (163 lb 2.3 oz)] 74 kg (163 lb 2.3 oz) (10/09 0400) Last BM Date: 10/08/15  Weight change: Filed Weights   10/06/15 0200 10/07/15 0700 10/08/15 0400  Weight: 77.2 kg (170 lb 3.1 oz) 75.2 kg (165 lb 12.6 oz) 74 kg (163 lb 2.3 oz)    Intake/Output:   Intake/Output Summary (Last 24 hours) at 10/08/15 1118 Last data filed at 10/08/15 1000  Gross per 24 hour  Intake  454.4 ml  Output   1363 ml  Net -908.6 ml     Physical Exam: CVP 10 General: lying in bed. Weak appearing HEENT: normal  Neck: supple. RIJ TLC . LIJ trialysis cath  Cor: PMI laterally displaced. regular + s3 Lungs: clear anteriorly   Abdomen: soft, nontender, nondistended. No bruits or masses. Good bowel sounds. Extremities: no cyanosis, clubbing, rash, no edema.  Neuro: Awake following commands.   Telemetry: sinus tach. 100-110.   Labs: Basic Metabolic Panel:  Recent Labs Lab 10/04/15 0415  10/05/15 0354  10/06/15 3500 10/06/15 0339 10/06/15 1536 10/07/15 0402 10/07/15 1657 10/08/15 0409  NA 140  < > 136  < > 137  --  138 139 135 137  K 4.3  < > 4.4  < > 4.4  --  4.5 4.5 4.4 4.4  CL 98*  < > 96*  < > 98*  --  101 102 99* 99*  CO2 29  < > 28  < > 27  --  26 27 28 27   GLUCOSE 105*  < > 120*  < > 90  --  106* 97 110* 96  BUN 87*  < > 41*  < > 26*  --  20 16 14 15   CREATININE 3.66*  < > 1.94*  < > 1.33*  --  1.25* 1.15 1.15 1.02  CALCIUM 7.9*  < > 8.0*  < > 8.2*  --  8.3* 8.2* 7.9* 8.0*  MG 2.5*   --  2.6*  --   --  2.5*  --  2.7*  --  2.5*  PHOS 6.3*  < > 3.9  < > 3.1  --  2.9 2.6 2.4* 2.4*  < > = values in this interval not displayed.  Liver Function Tests:  Recent Labs Lab 10/03/15 0500  10/04/15 0415 10/04/15 1235  10/06/15 0339 10/06/15 1536 10/07/15 0402 10/07/15 1657 10/08/15 0409  AST 69*  --  66*  --   --  55*  --  52*  --  57*  ALT 81*  --  85*  --   --  71*  --  64*  --  61  ALKPHOS 186*  --  186*  --   --  201*  --  196*  --  198*  BILITOT 6.7*  --  7.3* 7.7*  --  7.2*  --  7.6*  --  8.6*  PROT 5.1*  --  5.7*  --   --  5.4*  --  5.5*  --  5.6*  ALBUMIN 1.8*  < > 1.9*  --   < > 1.8* 1.9* 1.8* 1.8* 1.8*  1.8*  < > = values in this interval not displayed. No results for input(s): LIPASE, AMYLASE in the last 168 hours. No results for input(s): AMMONIA in the last 168 hours.  CBC:  Recent Labs Lab 10/04/15 0415 10/04/15 2255 10/05/15 0354 10/06/15 0339 10/07/15 0402  WBC 15.7* 15.5* 12.8* 16.7* 14.2*  NEUTROABS 14.9* 14.5*  --   --   --   HGB 9.6* 8.8* 8.7* 8.5* 8.2*  HCT 29.5* 28.3* 27.6* 26.6* 25.4*  MCV 74.3* 76.7* 75.4* 74.7* 74.7*  PLT 108* 147* 140* 139* 136*    Cardiac Enzymes: No results for input(s): CKTOTAL, CKMB, CKMBINDEX, TROPONINI in the last 168 hours.  BNP: BNP (last 3 results) No results for input(s): BNP in the last 8760 hours.  ProBNP (last 3 results) No results for input(s): PROBNP in the last 8760 hours.   CBG:  Recent Labs Lab 10/07/15 1603 10/07/15 1945 10/08/15 0006 10/08/15 0409 10/08/15 0812  GLUCAP 110* 93 90 90 93    Coagulation Studies:  Recent Labs  10/05/15 1339  LABPROT 15.8*  INR 1.24     Imaging: Dg Chest Port 1 View  10/07/2015   CLINICAL DATA:  Acute respiratory failure.  EXAM: PORTABLE CHEST 1 VIEW  COMPARISON:  10/05/2015 and 10/04/2015  FINDINGS: Two central lines and feeding tube are in place and appear in good position. Marked cardiomegaly. New hazy bilateral pulmonary infiltrates  with pulmonary vascular congestion. The findings could represent pulmonary edema or pneumonia.  IMPRESSION: New hazy bilateral infiltrates worrisome for either pulmonary edema or pneumonia.   Electronically Signed   By: Lorriane Shire M.D.   On: 10/07/2015 08:35     Medications:     Current Medications: . acyclovir  400 mg Per Tube Daily  . antiseptic oral rinse  7 mL Mouth Rinse BID  . hydrocortisone sodium succinate  50 mg Intravenous Q6H  . pantoprazole (PROTONIX) IV  40 mg Intravenous Q12H  . sodium phosphate  Dextrose 5% IVPB  30 mmol Intravenous Once    Infusions: . sodium chloride 10 mL/hr at 10/08/15 0834  . DOBUTamine 2.5 mcg/kg/min (10/08/15 0900)  . heparin 10,000 units/ 20 mL infusion syringe 2,500 Units/hr (10/08/15 0901)  . norepinephrine (LEVOPHED) Adult infusion 6 mcg/min (10/08/15 0900)  . dialysis replacement fluid (prismasate) 800 mL/hr at 10/08/15 0220  . dialysis replacement fluid (prismasate) 700 mL/hr at 10/08/15 0912  . dialysate (PRISMASATE) 1,500 mL/hr at 10/08/15 0550     Assessment:   1. Cardiogenic shock 2. Acute systolic HF EF 28-41%, etiology uncertain.  Brother has dilated cardiomyopathy so possibly familial.  Reports drinking 6 beers/day long-term.  3. Acute respiratory failure 4. Multisystem organ failure 5. Acute kidney injury 6. Shock liver 7. Hyperkalemia 8. Cervical lymphadenopathy - path appears to be Hodgkins lymphoma. Adcetria given 9/30    --s/p core biopsy 9/27 9. Tobacco use 10. ETOH use - 4+ beers per day 11. Family h/o NICM in brother 54. Massive upper GI bleed     --severe esophagitis by EGD on 9/27 13. Coagulopathy - due to shock liver with ? DIC 14. ?CAP  Plan/Discussion:    Remains hemodynamically tenuous despite pressor support. Rep[eat echo with LVEF 10-15%. No effusion. CCM weaning off dobutamine +/- norepi in effort to get him onto iHD. Agree with plan but given severe LV dysfunction  iHD may be challenging. Follow MV  sat and adjust inotropes for BP and MV sat.  Ideally would be helpful to know coronary anatomy but not candidate for cath if we are hoping for renal recovery. Not candidate for mechanical support with renal failure.   Continue supportive care as the oncology team treats his malignancy. Prognosis very guarded but we will continue to take one day at a time and hopefully he will begin to get some cardiac recovery.  The patient is critically ill with multiple organ systems failure and requires high complexity decision making for assessment and support, frequent evaluation and titration of therapies, application of advanced monitoring technologies and extensive interpretation of multiple databases.   Critical Care Time devoted to patient care services described in this note is 35 Minutes.  Bensimhon, Daniel,MD 11:18 AM Advanced Heart Failure Team Pager [phone] (M-F; 7a - 4p)  Please contact [facility] for night-coverage after hours (4p -7a ) and weekends on [email or web address]

## 2015-10-08 NOTE — Evaluation (Addendum)
Physical Therapy Evaluation Patient Details Name: Paul Morton MRN: [id number] DOB: 10/17/1961 Today's Date: 10/08/2015   History of Present Illness  09/23/2015 adm with back pain, fever, sepsis (pna); intubated 9/26-10/6; multiorgan failure with CVVH initiated via Lt IJ 10/4; diagnosed non-ischemic cardiomyopathy with EF 10%; PMHx- lymphoma (recently diagnosed); CHF; alcohol use/abuse;     Clinical Impression  Pt admitted with above diagnoses. Pt remains on CRRT, however is motivated to work with therapy. Tolerated bed level activities with MAP appropriately rising 68 to 74. Unable to transition to chair position due to MAP decr 74 to 46 with HOB elevated from 20-30 degrees. Pt currently with functional limitations due to the deficits listed below (see PT Problem List).  Pt will benefit from skilled PT to increase their independence and safety with mobility to ultimately allow discharge to a safe venue.        Follow Up Recommendations Other (comment) (TBA as progresses off CRRT)    Equipment Recommendations  None recommended by PT    Recommendations for Other Services OT consult (CIR consult likely as he progresses off CRRT)     Precautions / Restrictions Precautions Precautions: Fall;Other (comment) Precaution Comments: keep MAP>60 Restrictions Weight Bearing Restrictions: No      Mobility  Bed Mobility Overal bed mobility: Needs Assistance;+2 for physical assistance;+ 2 for safety/equipment Bed Mobility: Rolling (scoot to [facility]) Rolling: Max assist         General bed mobility comments: +2 total for scoot to [facility]  Transfers                 General transfer comment: unable to assess due to decr BP with HOB elevated  Ambulation/Gait                Stairs            Wheelchair Mobility    Modified Rankin (Stroke Patients Only)       Balance Overall balance assessment:  (TBA as BP permits)                                            Pertinent Vitals/Pain See vitals flow sheet.   Pain Assessment: No/denies pain    Home Living Family/patient expects to be discharged to:: Unsure Living Arrangements: Spouse/significant other               Additional Comments: Pt confused and no family present    Prior Function Level of Independence: Independent         Comments: by report     Hand Dominance        Extremity/Trunk Assessment   Upper Extremity Assessment: Generalized weakness           Lower Extremity Assessment: Generalized weakness         Communication   Communication: No difficulties  Cognition Arousal/Alertness: Awake/alert Behavior During Therapy: Flat affect Overall Cognitive Status: Impaired/Different from baseline Area of Impairment: Orientation;Following commands;Memory;Awareness;Problem solving Orientation Level: Place (felt he was at home)   Memory: Decreased short-term memory Following Commands: Follows one step commands with increased time   Awareness: Intellectual ("here due to weak heart and kidneys") Problem Solving: Slow processing;Decreased initiation;Requires verbal cues;Requires tactile cues General Comments: initially masking confusion well, however began to make non-sensical statements as he fatigued    General Comments      Exercises General Exercises - Upper  Extremity Shoulder Flexion: AROM;Both (3 reps) Shoulder Extension: Strengthening;Both (3 reps) Elbow Flexion: Strengthening;Both (3 reps) Elbow Extension: Strengthening;Both (3 reps) General Exercises - Lower Extremity Ankle Circles/Pumps: AROM;Strengthening;Both;10 reps Short Arc Quad: AROM;Both;5 reps Heel Slides: AROM;Strengthening;Both;5 reps (resisted extension) Other Exercises Other Exercises: Shoulder internal and external rotation with min resistance x 3 (supine)      Assessment/Plan    PT Assessment Patient needs continued PT services  PT Diagnosis Generalized  weakness;Altered mental status   PT Problem List Decreased strength;Decreased activity tolerance;Decreased balance;Decreased mobility;Decreased cognition;Decreased knowledge of use of DME;Decreased safety awareness;Cardiopulmonary status limiting activity  PT Treatment Interventions DME instruction;Gait training;Functional mobility training;Therapeutic activities;Therapeutic exercise;Balance training;Cognitive remediation;Patient/family education   PT Goals (Current goals can be found in the Care Plan section) Acute Rehab PT Goals Patient Stated Goal: get well  PT Goal Formulation: With patient Time For Goal Achievement: 10/22/15 Potential to Achieve Goals: Good    Frequency Min 3X/week   Barriers to discharge        Co-evaluation               End of Session   Activity Tolerance: Treatment limited secondary to medical complications (Comment) (hypotension with HOB elevated) Patient left: in bed;with call bell/phone within reach;with SCD's reapplied Nurse Communication: Other (comment) (BP decr (incr when HOB lowered))         Time: 6579-0383 PT Time Calculation (min) (ACUTE ONLY): 24 min   Charges:   PT Evaluation $Initial PT Evaluation Tier I: 1 Procedure PT Treatments $Therapeutic Exercise: 8-22 mins   PT G Codes:        Paul Morton 10/19/2015, 3:10 PM Pager [phone]

## 2015-10-08 NOTE — Progress Notes (Signed)
PULMONARY / CRITICAL CARE MEDICINE   Name: Paul Morton Scholz MRN: [id number] DOB: 01/21/1961    ADMISSION DATE:  09/23/2015  REFERRING MD :  EDP  CHIEF COMPLAINT:  Sepsis   INITIAL PRESENTATION:  54yo male smoker with recent diagnosis lymphoma (still in early stages of workup) who presented 9/25 with progressive back pain.  In ER found to be tachycardic, hypotensive with lactate 14, AKI and coagulopathy.  PCCM called to admit.    STUDIES:  PET 9/22 - Bulky confluent hypermetabolic left cervical lymphadenopathy involving levels II-V. Given the presence of hepatomegaly with diffuse liver hypermetabolism, a diagnosis of lymphoma is favored, however metastatic carcinoma remains on the differential.  Hypermetabolic patchy ground-glass opacity and consolidation in both upper lobes and right lower lobe, favor an infectious or inflammatory etiology.  TTE 9/26 - EF 10-15% w/o regional wall motion abnormality. Grade 3 diastolic dysfunction. Moderate AR. Moderate MR. RV severely dilated w/ moderate reduction in systolic function. PASP 43 mmHg. No pericardial effusion.  RLE Venous Duplex 9/26 - no DVT or SVT  MRI spine 9/25 - incomplete exam, No evidence of significant spinal stenosis or cord compression in the cervical or visualized thoracic spine.   MRI L-spine 9/27 - suboptimal exam. No malignant or suspicious signal.  Echo repeat 10/8- no changes  SIGNIFICANT EVENTS: 9/25 - Admitted to hospital 9/25 - Right IJ CVC by ED 9/26 - Intubated for pending respiratory failure & transferred to [facility] 9/27 - EGD w/ erosive esophagitis 9/27 - FNA neck mass 9/30 - Self extubation during SBT 10/1 - Reintubated  10/4 cvvhd started 10/5- hd cathter out, new placed, cvvhd restarted 10/6- extubated 10/7 - not  Responding to dobutamine  SUBJECTIVE:  Dobutamine at 2.5, levophed at 6 mic  VITAL SIGNS: Temp:  [97.3 F (36.3 C)-98 F (36.7 C)] 97.7 F (36.5 C) (10/09 0809) Pulse Rate:  [106-116] 109  (10/09 0900) Resp:  [14-27] 18 (10/09 0900) BP: (77-104)/(33-69) 88/63 mmHg (10/09 0900) SpO2:  [99 %-100 %] 100 % (10/09 0900) Weight:  [74 kg (163 lb 2.3 oz)] 74 kg (163 lb 2.3 oz) (10/09 0400) HEMODYNAMICS: CVP:  [4 mmHg-12 mmHg] 7 mmHg VENTILATOR SETTINGS:   INTAKE / OUTPUT:  Intake/Output Summary (Last 24 hours) at 10/08/15 1033 Last data filed at 10/08/15 1000  Gross per 24 hour  Intake  475.8 ml  Output   1411 ml  Net -935.2 ml    PHYSICAL EXAMINATION: General: awake, o x 3 Neck- no stridor HEENT: jvd lower, lymphad same rt >left neck Cardiovascular:  s1 s2 rrt no m Pulmonary: coarse Abdomen: Soft, non tender Neurological: A O x 3  LABS:  CBC  Recent Labs Lab 10/05/15 0354 10/06/15 0339 10/07/15 0402  WBC 12.8* 16.7* 14.2*  HGB 8.7* 8.5* 8.2*  HCT 27.6* 26.6* 25.4*  PLT 140* 139* 136*   Coag's  Recent Labs Lab 10/02/15 0438 10/03/15 0500  10/05/15 1339 10/06/15 0339 10/07/15 0402 10/08/15 0409  APTT 30  --   < > 44* 99* 109* 86*  INR 1.49 1.42  --  1.24  --   --   --   < > = values in this interval not displayed. BMET  Recent Labs Lab 10/07/15 0402 10/07/15 1657 10/08/15 0409  NA 139 135 137  K 4.5 4.4 4.4  CL 102 99* 99*  CO2 27 28 27   BUN 16 14 15   CREATININE 1.15 1.15 1.02  GLUCOSE 97 110* 96   Electrolytes  Recent Labs Lab 10/06/15  6503  10/07/15 0402 10/07/15 1657 10/08/15 0409  CALCIUM  --   < > 8.2* 7.9* 8.0*  MG 2.5*  --  2.7*  --  2.5*  PHOS  --   < > 2.6 2.4* 2.4*  < > = values in this interval not displayed.   Sepsis Markers  Recent Labs Lab 10/02/15 0438 10/07/15 1204  LATICACIDVEN  --  1.12  PROCALCITON 10.90  --    ABG  Recent Labs Lab 10/02/15 0420  PHART 7.453*  PCO2ART 41.7  PO2ART 170*   Liver Enzymes  Recent Labs Lab 10/06/15 0339  10/07/15 0402 10/07/15 1657 10/08/15 0409  AST 55*  --  52*  --  57*  ALT 71*  --  64*  --  61  ALKPHOS 201*  --  196*  --  198*  BILITOT 7.2*  --   7.6*  --  8.6*  ALBUMIN 1.8*  < > 1.8* 1.8* 1.8*  1.8*  < > = values in this interval not displayed. Cardiac Enzymes No results for input(s): TROPONINI, PROBNP in the last 168 hours. Glucose  Recent Labs Lab 10/07/15 1244 10/07/15 1603 10/07/15 1945 10/08/15 0006 10/08/15 0409 10/08/15 0812  GLUCAP 103* 110* 93 90 90 93    Imaging No results found.  ASSESSMENT / PLAN:  PULMONARY ETT 9/26>>>9/30 (self extubated)>10/1  A: Acute hypoxic respiratory failure 2nd to HCAP with ARDS >> less likely pulmonary edema. Hx of tobacco abuse. R/o lymphangitic spread lymphangitic spread? 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 Shock - combination of sepsis and cardiac. R/o burn out adrenal gland Poor response dobutmaine P:  MAp 60 Levo titrate down if able, to MAP goals Dobutamine reduction and svo2 went up, I recommend dc dobut , will d/w chf Echo repeat reviewed As cortisol dropped so significantly and on pressors weeks , will add stress roids empiric  RENAL A: Acute Renal Failure >> likely from CHF, sepsis, and diuresis >> creatinine rise again noted P:   cvvhd successful neg balance, maintain likely to IHD in am   GASTROINTESTINAL A: Shock liver - improving. Upper GIB - secondary to erosive esophagitis on EGD. Protein calorie malnutrition. R/o ileus hyperbili (lymphoma ), cholestasis likely, shock liver component  Loose stools, prior cdiff neg P:   ppi renal diet flexi seal, imodium escalate   HEMATOLOGIC A: New dx of Hodgkin's lymphoma Coagulopathy >> resolved. Anemia of critical illness, and GI bleed. Thrombocytopenia. P:  SCDs Chemotherapy per onc Limit phlebotomy  INFECTIOUS A: Resolved hcap P:   Vancomycin 9/25>>>off Zosyn 9/25>>>full course completed Zovirax for prophylaxis  Sputum 10/01 >>candida    Follow fevers  ENDOCRINE A: Hyperglycemia. Rel AI P:   SSI if CBG > 180 Controlled, follow on diet Empiric steroids  NEUROLOGIC A: Delirium likely P:   RASS goal 0 Need aggressive PT Behavior modification  Ccm time 30 mi n  Daniel J. Titus Mould, MD, [place] Pgr: [facility]

## 2015-10-09 ENCOUNTER — Inpatient Hospital Stay (HOSPITAL_COMMUNITY): Payer: BLUE CROSS/BLUE SHIELD

## 2015-10-09 DIAGNOSIS — R630 Anorexia: Secondary | ICD-10-CM

## 2015-10-09 DIAGNOSIS — K625 Hemorrhage of anus and rectum: Secondary | ICD-10-CM

## 2015-10-09 LAB — CBC
HCT: 26.5 % — ABNORMAL LOW (ref 39.0–52.0)
Hemoglobin: 8.2 g/dL — ABNORMAL LOW (ref 13.0–17.0)
MCH: 23.4 pg — ABNORMAL LOW (ref 26.0–34.0)
MCHC: 30.9 g/dL (ref 30.0–36.0)
MCV: 75.5 fL — ABNORMAL LOW (ref 78.0–100.0)
PLATELETS: 157 10*3/uL (ref 150–400)
RBC: 3.51 MIL/uL — ABNORMAL LOW (ref 4.22–5.81)
RDW: 19 % — AB (ref 11.5–15.5)
WBC: 3.1 10*3/uL — AB (ref 4.0–10.5)

## 2015-10-09 LAB — CARBOXYHEMOGLOBIN
CARBOXYHEMOGLOBIN: 1.8 % — AB (ref 0.5–1.5)
Carboxyhemoglobin: 1.9 % — ABNORMAL HIGH (ref 0.5–1.5)
Carboxyhemoglobin: 1.5 % (ref 0.5–1.5)
METHEMOGLOBIN: 0.7 % (ref 0.0–1.5)
Methemoglobin: 1.1 % (ref 0.0–1.5)
Methemoglobin: 1 % (ref 0.0–1.5)
O2 SAT: 43.9 %
O2 SAT: 45.8 %
O2 Saturation: 32.5 %
TOTAL HEMOGLOBIN: 8.7 g/dL — AB (ref 13.5–18.0)
TOTAL HEMOGLOBIN: 8.8 g/dL — AB (ref 13.5–18.0)
TOTAL HEMOGLOBIN: 8.2 g/dL — AB (ref 13.5–18.0)

## 2015-10-09 LAB — MAGNESIUM: Magnesium: 2.4 mg/dL (ref 1.7–2.4)

## 2015-10-09 LAB — RENAL FUNCTION PANEL
Albumin: 1.7 g/dL — ABNORMAL LOW (ref 3.5–5.0)
Anion gap: 10 (ref 5–15)
BUN: 15 mg/dL (ref 6–20)
CALCIUM: 7.5 mg/dL — AB (ref 8.9–10.3)
CO2: 25 mmol/L (ref 22–32)
CREATININE: 0.95 mg/dL (ref 0.61–1.24)
Chloride: 102 mmol/L (ref 101–111)
GFR calc Af Amer: >60 mL/min (ref >60)
GFR calc non Af Amer: >60 mL/min (ref >60)
Glucose, Bld: 126 mg/dL — ABNORMAL HIGH (ref 65–99)
Phosphorus: 2.6 mg/dL (ref 2.5–4.6)
Potassium: 4.4 mmol/L (ref 3.5–5.1)
SODIUM: 137 mmol/L (ref 135–145)

## 2015-10-09 LAB — COMPREHENSIVE METABOLIC PANEL
ALBUMIN: 1.8 g/dL — AB (ref 3.5–5.0)
ALT: 61 U/L (ref 17–63)
AST: 58 U/L — AB (ref 15–41)
Alkaline Phosphatase: 213 U/L — ABNORMAL HIGH (ref 38–126)
Anion gap: 11 (ref 5–15)
BUN: 14 mg/dL (ref 6–20)
CHLORIDE: 101 mmol/L (ref 101–111)
CO2: 26 mmol/L (ref 22–32)
Calcium: 7.9 mg/dL — ABNORMAL LOW (ref 8.9–10.3)
Creatinine, Ser: 1.02 mg/dL (ref 0.61–1.24)
GFR calc Af Amer: >60 mL/min (ref >60)
Glucose, Bld: 108 mg/dL — ABNORMAL HIGH (ref 65–99)
POTASSIUM: 4.7 mmol/L (ref 3.5–5.1)
Sodium: 138 mmol/L (ref 135–145)
Total Bilirubin: 8.8 mg/dL — ABNORMAL HIGH (ref 0.3–1.2)
Total Protein: 5.1 g/dL — ABNORMAL LOW (ref 6.5–8.1)

## 2015-10-09 LAB — BILIRUBIN, FRACTIONATED(TOT/DIR/INDIR)
BILIRUBIN DIRECT: 5.5 mg/dL — AB (ref 0.1–0.5)
BILIRUBIN INDIRECT: 3.1 mg/dL — AB (ref 0.3–0.9)
BILIRUBIN TOTAL: 8.6 mg/dL — AB (ref 0.3–1.2)

## 2015-10-09 LAB — POCT ACTIVATED CLOTTING TIME
ACTIVATED CLOTTING TIME: 171 s
Activated Clotting Time: 165 seconds
Activated Clotting Time: 177 seconds

## 2015-10-09 LAB — GLUCOSE, CAPILLARY
Glucose-Capillary: 102 mg/dL — ABNORMAL HIGH (ref 65–99)
Glucose-Capillary: 112 mg/dL — ABNORMAL HIGH (ref 65–99)

## 2015-10-09 LAB — APTT: aPTT: 111 seconds — ABNORMAL HIGH (ref 24–37)

## 2015-10-09 LAB — PHOSPHORUS: PHOSPHORUS: 2.8 mg/dL (ref 2.5–4.6)

## 2015-10-09 MED ORDER — DRONABINOL 2.5 MG PO CAPS
5.0000 mg | ORAL_CAPSULE | Freq: Two times a day (BID) | ORAL | Status: DC
  Administered 2015-10-09 – 2015-10-12 (×7): 5 mg via ORAL
  Filled 2015-10-09 (×5): qty 2

## 2015-10-09 MED ORDER — DOBUTAMINE IN D5W 4-5 MG/ML-% IV SOLN
7.5000 ug/kg/min | INTRAVENOUS | Status: DC
  Administered 2015-10-09 – 2015-10-12 (×3): 7.5 ug/kg/min via INTRAVENOUS
  Filled 2015-10-09 (×4): qty 250

## 2015-10-09 MED ORDER — BOOST / RESOURCE BREEZE PO LIQD
1.0000 | Freq: Three times a day (TID) | ORAL | Status: DC
  Administered 2015-10-10 – 2015-10-12 (×2): 1 via ORAL
  Filled 2015-10-09: qty 1

## 2015-10-09 NOTE — Progress Notes (Signed)
PULMONARY / CRITICAL CARE MEDICINE   Name: Paul Morton MRN: [id number] DOB: 1961/04/27    ADMISSION DATE:  09/11/2015  REFERRING MD :  EDP  CHIEF COMPLAINT:  Sepsis   INITIAL PRESENTATION:  54yo male smoker with recent diagnosis lymphoma (still in early stages of workup) who presented 9/25 with progressive back pain.  In ER found to be tachycardic, hypotensive with lactate 14, AKI and coagulopathy.  PCCM called to admit.  Found to have severe cardiomyopathy with EF 15%   STUDIES:  PET 9/22 - Bulky confluent hypermetabolic left cervical lymphadenopathy involving levels II-V. Given the presence of hepatomegaly with diffuse liver hypermetabolism, a diagnosis of lymphoma is favored, however metastatic carcinoma remains on the differential.  Hypermetabolic patchy ground-glass opacity and consolidation in both upper lobes and right lower lobe, favor an infectious or inflammatory etiology.  TTE 9/26 - EF 10-15% w/o regional wall motion abnormality. Grade 3 diastolic dysfunction. Moderate AR. Moderate MR. RV severely dilated w/ moderate reduction in systolic function. PASP 43 mmHg. No pericardial effusion.  RLE Venous Duplex 9/26 - no DVT or SVT  MRI spine 9/25 - incomplete exam, No evidence of significant spinal stenosis or cord compression in the cervical or visualized thoracic spine.   MRI L-spine 9/27 - suboptimal exam. No malignant or suspicious signal.  Echo repeat 10/8- no changes  SIGNIFICANT EVENTS: 9/25 - Admitted to hospital 9/25 - Right IJ CVC by ED 9/26 - Intubated for pending respiratory failure & transferred to [facility] 9/27 - EGD w/ erosive esophagitis 9/27 - FNA neck mass 9/30 - Self extubation during SBT 10/1 - Reintubated  10/4 cvvhd started 10/5- hd cathter out, new placed, cvvhd restarted 10/6- extubated 10/7 - not  Responding to dobutamine  SUBJECTIVE:  Dobutamine Off , levophed at 7 mic C/o abd pain diffuse , worse with food -no nausea/V Remains on  CVVH   VITAL SIGNS: Temp:  [95.8 F (35.4 C)-97.7 F (36.5 C)] 97.4 F (36.3 C) (10/10 1230) Pulse Rate:  [100-120] 102 (10/10 0900) Resp:  [13-30] 19 (10/10 0900) BP: (65-100)/(37-72) 85/68 mmHg (10/10 0900) SpO2:  [87 %-100 %] 98 % (10/10 0900) Weight:  [73.8 kg (162 lb 11.2 oz)] 73.8 kg (162 lb 11.2 oz) (10/10 0400) HEMODYNAMICS: CVP:  [10 mmHg-84 mmHg] 10 mmHg VENTILATOR SETTINGS:   INTAKE / OUTPUT:  Intake/Output Summary (Last 24 hours) at 10/09/15 1310 Last data filed at 10/09/15 0900  Gross per 24 hour  Intake  296.3 ml  Output   1509 ml  Net -1212.7 ml    PHYSICAL EXAMINATION: General: chr ill,  Neck- no stridor, no jvd HEENT: , lymphad same rt >left neck Cardiovascular:  s1 s2 rrt no m Pulmonary: decreased BL Abdomen: Soft, non tender Neurological: A O x 3  LABS:  CBC  Recent Labs Lab 10/06/15 0339 10/07/15 0402 10/08/15 1514 10/09/15 0420  WBC 16.7* 14.2*  --  3.1*  HGB 8.5* 8.2* 8.2* 8.2*  HCT 26.6* 25.4* 26.5* 26.5*  PLT 139* 136*  --  157   Coag's  Recent Labs Lab 10/03/15 0500  10/05/15 1339  10/07/15 0402 10/08/15 0409 10/09/15 0420  APTT  --   < > 44*  < > 109* 86* 111*  INR 1.42  --  1.24  --   --   --   --   < > = values in this interval not displayed. BMET  Recent Labs Lab 10/08/15 0409 10/08/15 1550 10/09/15 0420  NA 137 135 138  K  4.4 4.6 4.7  CL 99* 98* 101  CO2 27 28 26   BUN 15 14 14   CREATININE 1.02 1.05 1.02  GLUCOSE 96 116* 108*   Electrolytes  Recent Labs Lab 10/07/15 0402  10/08/15 0409 10/08/15 1550 10/09/15 0420  CALCIUM 8.2*  < > 8.0* 7.8* 7.9*  MG 2.7*  --  2.5*  --  2.4  PHOS 2.6  < > 2.4* 3.6 2.8  < > = values in this interval not displayed.   Sepsis Markers  Recent Labs Lab 10/07/15 1204  LATICACIDVEN 1.12   ABG No results for input(s): PHART, PCO2ART, PO2ART in the last 168 hours. Liver Enzymes  Recent Labs Lab 10/07/15 0402  10/08/15 0409 10/08/15 1550 10/09/15 0420  AST  52*  --  57*  --  58*  ALT 64*  --  61  --  61  ALKPHOS 196*  --  198*  --  213*  BILITOT 7.6*  --  8.6*  --  8.8*  ALBUMIN 1.8*  < > 1.8*  1.8* 1.8* 1.8*  < > = values in this interval not displayed. Cardiac Enzymes No results for input(s): TROPONINI, PROBNP in the last 168 hours. Glucose  Recent Labs Lab 10/08/15 0812 10/08/15 1132 10/08/15 1547 10/08/15 1956 10/09/15 10/09/15 0815  GLUCAP 93 111* 109* 104* 112* 102*    Imaging Dg Abd Portable 1v  10/08/2015   CLINICAL DATA:  54 year old male with acute abdominal pain.  EXAM: PORTABLE ABDOMEN - 1 VIEW  COMPARISON:  Abdominal radiograph 10/05/2015.  FINDINGS: Previously noted feeding tube has been removed. Some gas within the stomach and small bowel. Paucity of colonic gas. No pathologic dilatation of small bowel or colon. No pneumoperitoneum.  IMPRESSION: 1. Nonspecific, nonobstructive bowel gas pattern, as above. 2. Interval removal of feeding tube.   Electronically Signed   By: Vinnie Langton M.D.   On: 10/08/2015 16:42    ASSESSMENT / PLAN:  PULMONARY ETT 9/26>>>9/30 (self extubated)>10/1  A: Acute hypoxic respiratory failure 2nd to HCAP with ARDS >> less likely pulmonary edema -resolved Hx of tobacco abuse. 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 Shock - combination of sepsis and cardiac. R/o burn out adrenal gland Poor response dobutmaine P:  MAp 60 Levo titrate down if able, to MAP goals As cortisol dropped so significantly and on pressors weeks , will add stress roids empiric  RENAL A: Acute Renal Failure >> likely from CHF, sepsis, and diuresis >> creatinine rise again noted P:   cvvhd   IHD once off pressors  GASTROINTESTINAL A: Shock liver - improving. Upper GIB - secondary to erosive esophagitis on EGD. Protein calorie malnutrition. R/o ileus hyperbili  (lymphoma ), cholestasis likely, shock liver component  Loose stools, prior cdiff neg P:   ppi renal diet flexi seal, imodium  Consider TNA if unable to tolerate PO - as last resort  HEMATOLOGIC A: New dx of Hodgkin's lymphoma Coagulopathy >> resolved. Anemia of critical illness, and GI bleed. Thrombocytopenia. P:  SCDs Chemotherapy per onc Limit phlebotomy  INFECTIOUS A: Resolved hcap P:   Vancomycin 9/25>>>off Zosyn 9/25>>>full course completed Zovirax for prophylaxis  Sputum 10/01 >>candida   Follow fevers  ENDOCRINE A: Hyperglycemia. Rel AI P:   SSI if CBG > 180 Empiric steroids  NEUROLOGIC A: Delirium likely P:   RASS goal 0  Need aggressive PT Behavior modification  Summary - persistent shock ? Cardiac vs septic, Cholestasis ? Related to shock liver vs cardiac congestion  The patient is critically ill with multiple organ systems failure and requires high complexity decision making for assessment and support, frequent evaluation and titration of therapies, application of advanced monitoring technologies and extensive interpretation of multiple databases. Critical Care Time devoted to patient care services described in this note independent of APP time is 32 minutes.   Kara Mead MD. [place] Flood. [facility] Pager [phone] If no response call [phone]

## 2015-10-09 NOTE — Progress Notes (Signed)
Advanced Heart Failure Team Rounding Note  Subjective:  Remains on CVVHD. Off dobutamine. On low-dose norepi. SBP 80-90. CVP 10.   Repeat echo 10/5. LVEF 10-15%. RV ok. No effusion .    Objective:    Vital Signs:   Temp:  [95.8 F (35.4 C)-97.7 F (36.5 C)] 97.4 F (36.3 C) (10/10 0818) Pulse Rate:  [100-120] 102 (10/10 0900) Resp:  [13-30] 19 (10/10 0900) BP: (65-100)/(37-72) 85/68 mmHg (10/10 0900) SpO2:  [87 %-100 %] 98 % (10/10 0900) Weight:  [73.8 kg (162 lb 11.2 oz)] 73.8 kg (162 lb 11.2 oz) (10/10 0400) Last BM Date: 10/09/15  Weight change: Filed Weights   10/07/15 0700 10/08/15 0400 10/09/15 0400  Weight: 75.2 kg (165 lb 12.6 oz) 74 kg (163 lb 2.3 oz) 73.8 kg (162 lb 11.2 oz)    Intake/Output:   Intake/Output Summary (Last 24 hours) at 10/09/15 1159 Last data filed at 10/09/15 0900  Gross per 24 hour  Intake  370.5 ml  Output   1694 ml  Net -1323.5 ml     Physical Exam: CVP 10 General: lying in bed. Weak appearing. HEENT: normal  Neck: supple. RIJ TLC . LIJ trialysis cath  Cor: PMI laterally displaced. regular + s3 Lungs: clear anteriorly   Abdomen: soft, nontender, nondistended. No bruits or masses. Good bowel sounds. Extremities: no cyanosis, clubbing, rash, no edema.  Neuro: Awake following commands.   Telemetry: sinus tach. 100-110.   Labs: Basic Metabolic Panel:  Recent Labs Lab 10/05/15 0354  10/06/15 0339  10/07/15 0402 10/07/15 1657 10/08/15 0409 10/08/15 1550 10/09/15 0420  NA 136  < >  --   < > 139 135 137 135 138  K 4.4  < >  --   < > 4.5 4.4 4.4 4.6 4.7  CL 96*  < >  --   < > 102 99* 99* 98* 101  CO2 28  < >  --   < > 27 28 27 28 26   GLUCOSE 120*  < >  --   < > 97 110* 96 116* 108*  BUN 41*  < >  --   < > 16 14 15 14 14   CREATININE 1.94*  < >  --   < > 1.15 1.15 1.02 1.05 1.02  CALCIUM 8.0*  < >  --   < > 8.2* 7.9* 8.0* 7.8* 7.9*  MG 2.6*  --  2.5*  --  2.7*  --  2.5*  --  2.4  PHOS 3.9  < >  --   < > 2.6 2.4* 2.4* 3.6  2.8  < > = values in this interval not displayed.  Liver Function Tests:  Recent Labs Lab 10/04/15 0415 10/04/15 1235  10/06/15 0339  10/07/15 0402 10/07/15 1657 10/08/15 0409 10/08/15 1550 10/09/15 0420  AST 66*  --   --  55*  --  52*  --  57*  --  58*  ALT 85*  --   --  71*  --  64*  --  61  --  61  ALKPHOS 186*  --   --  201*  --  196*  --  198*  --  213*  BILITOT 7.3* 7.7*  --  7.2*  --  7.6*  --  8.6*  --  8.8*  PROT 5.7*  --   --  5.4*  --  5.5*  --  5.6*  --  5.1*  ALBUMIN 1.9*  --   < >  1.8*  < > 1.8* 1.8* 1.8*  1.8* 1.8* 1.8*  < > = values in this interval not displayed. No results for input(s): LIPASE, AMYLASE in the last 168 hours. No results for input(s): AMMONIA in the last 168 hours.  CBC:  Recent Labs Lab 10/04/15 0415 10/04/15 2255 10/05/15 0354 10/06/15 0339 10/07/15 0402 10/08/15 1514 10/09/15 0420  WBC 15.7* 15.5* 12.8* 16.7* 14.2*  --  3.1*  NEUTROABS 14.9* 14.5*  --   --   --   --   --   HGB 9.6* 8.8* 8.7* 8.5* 8.2* 8.2* 8.2*  HCT 29.5* 28.3* 27.6* 26.6* 25.4* 26.5* 26.5*  MCV 74.3* 76.7* 75.4* 74.7* 74.7*  --  75.5*  PLT 108* 147* 140* 139* 136*  --  157    Cardiac Enzymes: No results for input(s): CKTOTAL, CKMB, CKMBINDEX, TROPONINI in the last 168 hours.  BNP: BNP (last 3 results) No results for input(s): BNP in the last 8760 hours.  ProBNP (last 3 results) No results for input(s): PROBNP in the last 8760 hours.   CBG:  Recent Labs Lab 10/08/15 1132 10/08/15 1547 10/08/15 1956 10/09/15 10/09/15 0815  GLUCAP 111* 109* 104* 112* 102*    Coagulation Studies: No results for input(s): LABPROT, INR in the last 72 hours.   Imaging: Dg Abd Portable 1v  10/08/2015   CLINICAL DATA:  54 year old male with acute abdominal pain.  EXAM: PORTABLE ABDOMEN - 1 VIEW  COMPARISON:  Abdominal radiograph 10/05/2015.  FINDINGS: Previously noted feeding tube has been removed. Some gas within the stomach and small bowel. Paucity of colonic gas.  No pathologic dilatation of small bowel or colon. No pneumoperitoneum.  IMPRESSION: 1. Nonspecific, nonobstructive bowel gas pattern, as above. 2. Interval removal of feeding tube.   Electronically Signed   By: Vinnie Langton M.D.   On: 10/08/2015 16:42     Medications:     Current Medications: . acyclovir  400 mg Per Tube Daily  . antiseptic oral rinse  7 mL Mouth Rinse BID  . dronabinol  5 mg Oral BID AC  . hydrocortisone sodium succinate  50 mg Intravenous Q6H  . pantoprazole (PROTONIX) IV  40 mg Intravenous Q12H    Infusions: . sodium chloride 10 mL/hr at 10/09/15 1145  . heparin 10,000 units/ 20 mL infusion syringe 2,500 Units/hr (10/09/15 0444)  . norepinephrine (LEVOPHED) Adult infusion 7 mcg/min (10/09/15 0700)  . dialysis replacement fluid (prismasate) 800 mL/hr at 10/09/15 1145  . dialysis replacement fluid (prismasate) 700 mL/hr at 10/09/15 0548  . dialysate (PRISMASATE) 1,500 mL/hr at 10/09/15 1144     Assessment:   1. Cardiogenic shock 2. Acute systolic HF EF 76-28%, etiology uncertain.  Brother has dilated cardiomyopathy so possibly familial.  Reports drinking 6 beers/day long-term.  3. Acute respiratory failure 4. Multisystem organ failure 5. Acute kidney injury 6. Shock liver 7. Hyperkalemia 8. Cervical lymphadenopathy - path appears to be Hodgkins lymphoma. Adcetria given 9/30    --s/p core biopsy 9/27 9. Tobacco use 10. ETOH use - 4+ beers per day 11. Family h/o NICM in brother 19. Massive upper GI bleed     --severe esophagitis by EGD on 9/27 13. Coagulopathy - due to shock liver with ? DIC 14. ?CAP  Plan/Discussion:    Slowly improving despite persistently low EF. Tolerating wean of dobutamine. Remains on low-dose norepi. Will check co-ox. Continue CVVHD per Renal. No new recs at this time.   Geniyah Eischeid,MD 11:59 AM Advanced Heart Failure Team Pager [phone] (  M-F; 7a - 4p)  Please contact [facility] for night-coverage after hours  (4p -7a ) and weekends on [email or web address]

## 2015-10-09 NOTE — Progress Notes (Signed)
Mr. Moyano is still off the ventilator. He is still on Levothroid. He apparently had his ejection fraction checked over the weekend. His ejection fraction is still 10-15%.  He is still on a continuous dialysis. Has been and creatinine have basely normalized.  His lab work shows his bilirubin is still going up. I think this is a reflection of hepatic injury from cardiomyopathy and possibly I'll call use. In addition, being on pressors might also affect her hepatic function.  His left cervical lymphadenopathy has resolved. He may have a little bit of fullness in the posterior cervical region but he probably has had over 90% reduction in the lymph node. A very interesting to see what his CT scan looks like. However, I'm sure he would be able to get one right now.  His blood counts look pretty stable. Hemoglobin 8.2. White count 3.1. He is now 10 days out from treatment. Will have to watch his white cell count.  He has a rectal tube in. There is still some blood coming from the rectal tube according to his nurse.Marland Kitchen He is making some urine.  On physical exam, he is afebrile. His blood pressure is 91/61. His pulse is 103. His lungs sound clear. Cardiac exam regular is tachycardic regular. He may have occasional extra beat. His head and neck exam shows no thrush. Again, there might be some slight fullness in the posterior cervical lymph nodes. There is no supraclavicular lymphadenopathy. Abdomen is soft. I cannot palpate his liver or spleen. There is no fluid wave. Extremities shows compression devices on his legs. I see no ecchymosis or petechia.  As for his Hodgkin's disease, this has responded well so far. It would be nice to get a CT scan to see how the lymphadenopathy looks.  As always, it was cardiac function that dictates his prognosis. Hopefully, he will be oh to come off the dialysis soon and may be having intermittent dialysis.  I will start him on some Marinol to try to help with his appetite. He  really needs to eat.  I appreciate everybody's help in the ICU.  Pete E.  2 Thessalonians 3:3

## 2015-10-09 NOTE — Progress Notes (Signed)
Patient ID: Paul Morton, male   DOB: [birth year], 54 y.o.   MRN: [id number] S:feels good today, no complaints O:BP 91/61 mmHg  Pulse 103  Temp(Src) 97.4 F (36.3 C) (Oral)  Resp 19  Ht 5' 11.5" (1.816 m)  Wt 73.8 kg (162 lb 11.2 oz)  BMI 22.38 kg/m2  SpO2 97%  Intake/Output Summary (Last 24 hours) at 10/09/15 0849 Last data filed at 10/09/15 0700  Gross per 24 hour  Intake    555 ml  Output   1814 ml  Net  -1259 ml   Intake/Output: I/O last 3 completed shifts: In: 846.5 [P.O.:30; I.V.:623.5; IV Piggyback:193] Out: 2468 [Other:1768; Stool:700]  Intake/Output this shift:    Weight change: -0.2 kg (-7.1 oz) INO:MVEHM, chronically ill-appearing WM CNO:[id number] no rub Resp:cta UTM:LYYTKP Ext:no edema   Recent Labs Lab 10/03/15 0500  10/04/15 0415  10/06/15 0338 10/06/15 0339 10/06/15 1536 10/07/15 0402 10/07/15 1657 10/08/15 0409 10/08/15 1550 10/09/15 0420  NA 140  < > 140  < > 137  --  138 139 135 137 135 138  K 4.2  < > 4.3  < > 4.4  --  4.5 4.5 4.4 4.4 4.6 4.7  CL 89*  < > 98*  < > 98*  --  101 102 99* 99* 98* 101  CO2 31  < > 29  < > 27  --  26 27 28 27 28 26   GLUCOSE 128*  < > 105*  < > 90  --  106* 97 110* 96 116* 108*  BUN 200*  < > 87*  < > 26*  --  20 16 14 15 14 14   CREATININE 7.70*  < > 3.66*  < > 1.33*  --  1.25* 1.15 1.15 1.02 1.05 1.02  ALBUMIN 1.8*  < > 1.9*  < > 1.8* 1.8* 1.9* 1.8* 1.8* 1.8*  1.8* 1.8* 1.8*  CALCIUM 7.3*  < > 7.9*  < > 8.2*  --  8.3* 8.2* 7.9* 8.0* 7.8* 7.9*  PHOS 11.1*  < > 6.3*  < > 3.1  --  2.9 2.6 2.4* 2.4* 3.6 2.8  AST 69*  --  66*  --   --  55*  --  52*  --  57*  --  58*  ALT 81*  --  85*  --   --  71*  --  64*  --  61  --  61  < > = values in this interval not displayed. Liver Function Tests:  Recent Labs Lab 10/07/15 0402  10/08/15 0409 10/08/15 1550 10/09/15 0420  AST 52*  --  57*  --  58*  ALT 64*  --  61  --  61  ALKPHOS 196*  --  198*  --  213*  BILITOT 7.6*  --  8.6*  --  8.8*  PROT 5.5*  --  5.6*  --   5.1*  ALBUMIN 1.8*  < > 1.8*  1.8* 1.8* 1.8*  < > = values in this interval not displayed. No results for input(s): LIPASE, AMYLASE in the last 168 hours. No results for input(s): AMMONIA in the last 168 hours. CBC:  Recent Labs Lab 10/04/15 0415 10/04/15 2255 10/05/15 0354 10/06/15 0339 10/07/15 0402 10/08/15 1514 10/09/15 0420  WBC 15.7* 15.5* 12.8* 16.7* 14.2*  --  3.1*  NEUTROABS 14.9* 14.5*  --   --   --   --   --   HGB 9.6* 8.8* 8.7* 8.5* 8.2* 8.2* 8.2*  HCT 29.5* 28.3* 27.6* 26.6* 25.4* 26.5* 26.5*  MCV 74.3* 76.7* 75.4* 74.7* 74.7*  --  75.5*  PLT 108* 147* 140* 139* 136*  --  157   Cardiac Enzymes: No results for input(s): CKTOTAL, CKMB, CKMBINDEX, TROPONINI in the last 168 hours. CBG:  Recent Labs Lab 10/08/15 0409 10/08/15 0812 10/08/15 1132 10/08/15 1547 10/08/15 1956  GLUCAP 90 93 111* 109* 104*    Iron Studies: No results for input(s): IRON, TIBC, TRANSFERRIN, FERRITIN in the last 72 hours. Studies/Results: Dg Abd Portable 1v  10/08/2015   CLINICAL DATA:  54 year old male with acute abdominal pain.  EXAM: PORTABLE ABDOMEN - 1 VIEW  COMPARISON:  Abdominal radiograph 10/05/2015.  FINDINGS: Previously noted feeding tube has been removed. Some gas within the stomach and small bowel. Paucity of colonic gas. No pathologic dilatation of small bowel or colon. No pneumoperitoneum.  IMPRESSION: 1. Nonspecific, nonobstructive bowel gas pattern, as above. 2. Interval removal of feeding tube.   Electronically Signed   By: Paul Morton M.D.   On: 10/08/2015 16:42   . acyclovir  400 mg Per Tube Daily  . antiseptic oral rinse  7 mL Mouth Rinse BID  . dronabinol  5 mg Oral BID AC  . hydrocortisone sodium succinate  50 mg Intravenous Q6H  . pantoprazole (PROTONIX) IV  40 mg Intravenous Q12H    BMET    Component Value Date/Time   NA 138 10/09/2015 0420   NA 136 09/12/2015 0909   K 4.7 10/09/2015 0420   K 4.6 09/12/2015 0909   CL 101 10/09/2015 0420   CL 98  09/12/2015 0909   CO2 26 10/09/2015 0420   CO2 25 09/12/2015 0909   GLUCOSE 108* 10/09/2015 0420   GLUCOSE 92 09/12/2015 0909   BUN 14 10/09/2015 0420   BUN 20 09/12/2015 0909   CREATININE 1.02 10/09/2015 0420   CREATININE 1.2 09/12/2015 0909   CALCIUM 7.9* 10/09/2015 0420   CALCIUM 9.8 09/12/2015 0909   GFRNONAA >60 10/09/2015 0420   GFRAA >60 10/09/2015 0420   CBC    Component Value Date/Time   WBC 3.1* 10/09/2015 0420   WBC 13.3* 09/12/2015 0908   WBC 14.9* 09/08/2015 0931   RBC 3.51* 10/09/2015 0420   RBC 3.89* 09/12/2015 0908   RBC 4.14* 09/08/2015 0931   HGB 8.2* 10/09/2015 0420   HGB 9.9* 09/12/2015 0908   HGB 10.1* 09/08/2015 0931   HCT 26.5* 10/09/2015 0420   HCT 30.6* 09/12/2015 0908   HCT 32.3* 09/08/2015 0931   PLT 157 10/09/2015 0420   PLT 528* 09/12/2015 0908   MCV 75.5* 10/09/2015 0420   MCV 79* 09/12/2015 0908   MCV 78.0* 09/08/2015 0931   MCH 23.4* 10/09/2015 0420   MCH 25.4* 09/12/2015 0908   MCH 24.3* 09/08/2015 0931   MCHC 30.9 10/09/2015 0420   MCHC 32.4 09/12/2015 0908   MCHC 31.2* 09/08/2015 0931   RDW 19.0* 10/09/2015 0420   RDW 16.6* 09/12/2015 0908   LYMPHSABS 0.7 10/04/2015 2255   LYMPHSABS 1.0 09/12/2015 0908   MONOABS 0.3 10/04/2015 2255   EOSABS 0.1 10/04/2015 2255   EOSABS 0.1 09/12/2015 0908   BASOSABS 0.0 10/04/2015 2255   BASOSABS 0.0 09/12/2015 0908    Assessment/Plan:  1. ARF, oliguric due to cardiogenic shock.  Currently on pressors and CVVHD.  Continue with current setting for now.  Wean pressors as able. 2. Cardiogenic shock due to acute systolic heart failure with EF 10-15% unclear etiology (?etoh related or familial).  On levophed and dobutamine. 3. Hodgkins lymphoma- s/p Adcetria on 09/29/15 and per Heme has responded well to therapy but unclear if lymphadenopathy has responded. 4. Etoh use- 6 beers/day 5. Tobacco abuse 6. GI bleed due to severe esophagitis 7. Shock liver due to systolic heart  failure/CMP 8. Coagulopathy 9. Disposition- pt has poor overall prognosis related to his severe CMP (EF 10-15%) without including lymphoma and renal failure.  Unclear what endpoint will be but recommend hospice/palliative care consult to set goals/limits of care.  [place] A

## 2015-10-09 NOTE — Progress Notes (Signed)
Nutrition Follow-up  DOCUMENTATION CODES:   Not applicable  INTERVENTION:    Boost Breeze PO TID, each supplement provides 250 kcal and 9 grams of protein  NUTRITION DIAGNOSIS:   Inadequate oral intake related to poor appetite as evidenced by per patient/family report.  Ongoing  GOAL:   Patient will meet greater than or equal to 90% of their needs  Unmet  MONITOR:   PO intake, Supplement acceptance, Labs, Weight trends  ASSESSMENT:   54yo male smoker with recent diagnosis lymphoma (still in early stages of workup) who presented 9/25 with progressive back pain. In ER found to be tachycardic, hypotensive with lactate 14, AKI and coagulopathy.   Patient was extubated on 10/6. CRRT continues. Patient reports poor intake related to poor appetite since extubation. He does not want any supplements, but reluctantly agreed to try Boost Breeze "later today". Weight continues to trend down.   Diet Order:  Diet renal with fluid restriction Fluid restriction:: 1200 mL Fluid; Room service appropriate?: Yes; Fluid consistency:: Thin  Skin:  Reviewed, no issues  Last BM:  10/10  Height:   Ht Readings from Last 1 Encounters:  09/25/15 5' 11.5" (1.816 m)    Weight:   Wt Readings from Last 1 Encounters:  10/09/15 162 lb 11.2 oz (73.8 kg)   09/28/15 199 lb 1.2 oz (90.3 kg)   09/06/2015 204 lb 5.9 oz (92.7 kg)            Ideal Body Weight:  79.5 kg  BMI:  Body mass index is 22.38 kg/(m^2).  Estimated Nutritional Needs:   Kcal:  0981-1914  Protein:  145-185 gm  Fluid:  Per medical team  EDUCATION NEEDS:   No education needs identified at this time  Molli Barrows, [place], [place], [place] Pager [phone] After Hours Pager [phone]

## 2015-10-10 ENCOUNTER — Inpatient Hospital Stay (HOSPITAL_COMMUNITY): Payer: BLUE CROSS/BLUE SHIELD

## 2015-10-10 DIAGNOSIS — Z515 Encounter for palliative care: Secondary | ICD-10-CM

## 2015-10-10 DIAGNOSIS — Z7189 Other specified counseling: Secondary | ICD-10-CM

## 2015-10-10 DIAGNOSIS — R590 Localized enlarged lymph nodes: Secondary | ICD-10-CM

## 2015-10-10 LAB — POCT I-STAT 3, ART BLOOD GAS (G3+)
Acid-Base Excess: 2 mmol/L (ref 0.0–2.0)
Bicarbonate: 25 mEq/L — ABNORMAL HIGH (ref 20.0–24.0)
O2 Saturation: 97 %
TCO2: 26 mmol/L (ref 0–100)
pCO2 arterial: 30.6 mmHg — ABNORMAL LOW (ref 35.0–45.0)
pH, Arterial: 7.519 — ABNORMAL HIGH (ref 7.350–7.450)
pO2, Arterial: 79 mmHg — ABNORMAL LOW (ref 80.0–100.0)

## 2015-10-10 LAB — RENAL FUNCTION PANEL
ALBUMIN: 1.7 g/dL — AB (ref 3.5–5.0)
ALBUMIN: 1.6 g/dL — AB (ref 3.5–5.0)
ANION GAP: 9 (ref 5–15)
Anion gap: 10 (ref 5–15)
BUN: 14 mg/dL (ref 6–20)
BUN: 11 mg/dL (ref 6–20)
CALCIUM: 7.3 mg/dL — AB (ref 8.9–10.3)
CHLORIDE: 101 mmol/L (ref 101–111)
CO2: 26 mmol/L (ref 22–32)
CO2: 25 mmol/L (ref 22–32)
CREATININE: 1.02 mg/dL (ref 0.61–1.24)
Calcium: 7.6 mg/dL — ABNORMAL LOW (ref 8.9–10.3)
Chloride: 99 mmol/L — ABNORMAL LOW (ref 101–111)
Creatinine, Ser: 1.03 mg/dL (ref 0.61–1.24)
Glucose, Bld: 110 mg/dL — ABNORMAL HIGH (ref 65–99)
Glucose, Bld: 136 mg/dL — ABNORMAL HIGH (ref 65–99)
PHOSPHORUS: 2.3 mg/dL — AB (ref 2.5–4.6)
PHOSPHORUS: 2.1 mg/dL — AB (ref 2.5–4.6)
POTASSIUM: 4.6 mmol/L (ref 3.5–5.1)
POTASSIUM: 4.2 mmol/L (ref 3.5–5.1)
SODIUM: 134 mmol/L — AB (ref 135–145)
Sodium: 136 mmol/L (ref 135–145)

## 2015-10-10 LAB — HEPATIC FUNCTION PANEL
ALBUMIN: 1.7 g/dL — AB (ref 3.5–5.0)
ALT: 56 U/L (ref 17–63)
AST: 44 U/L — ABNORMAL HIGH (ref 15–41)
Alkaline Phosphatase: 182 U/L — ABNORMAL HIGH (ref 38–126)
BILIRUBIN INDIRECT: 2.9 mg/dL — AB (ref 0.3–0.9)
Bilirubin, Direct: 5.1 mg/dL — ABNORMAL HIGH (ref 0.1–0.5)
TOTAL PROTEIN: 5.1 g/dL — AB (ref 6.5–8.1)
Total Bilirubin: 8 mg/dL — ABNORMAL HIGH (ref 0.3–1.2)

## 2015-10-10 LAB — POCT ACTIVATED CLOTTING TIME
ACTIVATED CLOTTING TIME: 171 s
ACTIVATED CLOTTING TIME: 177 s
ACTIVATED CLOTTING TIME: 177 s
ACTIVATED CLOTTING TIME: 178 s
ACTIVATED CLOTTING TIME: 183 s
ACTIVATED CLOTTING TIME: 184 s
Activated Clotting Time: 183 seconds

## 2015-10-10 LAB — CARBOXYHEMOGLOBIN
Carboxyhemoglobin: 1.7 % — ABNORMAL HIGH (ref 0.5–1.5)
Methemoglobin: 0.9 % (ref 0.0–1.5)
O2 SAT: 51.6 %
Total hemoglobin: 8.6 g/dL — ABNORMAL LOW (ref 13.5–18.0)

## 2015-10-10 LAB — GLUCOSE, CAPILLARY
GLUCOSE-CAPILLARY: 114 mg/dL — AB (ref 65–99)
Glucose-Capillary: 102 mg/dL — ABNORMAL HIGH (ref 65–99)

## 2015-10-10 LAB — CBC
HEMATOCRIT: 25.9 % — AB (ref 39.0–52.0)
Hemoglobin: 8.3 g/dL — ABNORMAL LOW (ref 13.0–17.0)
MCH: 24.3 pg — ABNORMAL LOW (ref 26.0–34.0)
MCHC: 32 g/dL (ref 30.0–36.0)
MCV: 75.7 fL — AB (ref 78.0–100.0)
PLATELETS: 111 10*3/uL — AB (ref 150–400)
RBC: 3.42 MIL/uL — AB (ref 4.22–5.81)
RDW: 19.6 % — AB (ref 11.5–15.5)
WBC: 2.7 10*3/uL — AB (ref 4.0–10.5)

## 2015-10-10 LAB — MAGNESIUM: MAGNESIUM: 2.5 mg/dL — AB (ref 1.7–2.4)

## 2015-10-10 LAB — C DIFFICILE QUICK SCREEN W PCR REFLEX
C DIFFICILE (CDIFF) TOXIN: POSITIVE — AB
C Diff antigen: POSITIVE — AB
C Diff interpretation: POSITIVE

## 2015-10-10 LAB — APTT: aPTT: 132 seconds — ABNORMAL HIGH (ref 24–37)

## 2015-10-10 MED ORDER — FENTANYL CITRATE (PF) 100 MCG/2ML IJ SOLN
INTRAMUSCULAR | Status: AC
  Filled 2015-10-10: qty 2

## 2015-10-10 MED ORDER — FILGRASTIM 480 MCG/1.6ML IJ SOLN
480.0000 ug | Freq: Once | INTRAMUSCULAR | Status: AC
  Administered 2015-10-10: 480 ug via SUBCUTANEOUS
  Filled 2015-10-10: qty 1.6

## 2015-10-10 MED ORDER — FLUCONAZOLE 100 MG PO TABS
100.0000 mg | ORAL_TABLET | Freq: Every day | ORAL | Status: DC
  Administered 2015-10-10 – 2015-10-13 (×4): 100 mg via ORAL
  Filled 2015-10-10 (×5): qty 1

## 2015-10-10 MED ORDER — FENTANYL CITRATE (PF) 100 MCG/2ML IJ SOLN
50.0000 ug | Freq: Once | INTRAMUSCULAR | Status: DC
  Administered 2015-10-10: 50 ug via INTRAVENOUS

## 2015-10-10 MED ORDER — DEXTROSE 5 % IV SOLN
30.0000 ug/min | INTRAVENOUS | Status: DC
  Administered 2015-10-10 – 2015-10-12 (×4): 30 ug/min via INTRAVENOUS
  Filled 2015-10-10 (×3): qty 4

## 2015-10-10 MED ORDER — FENTANYL CITRATE (PF) 100 MCG/2ML IJ SOLN
25.0000 ug | INTRAMUSCULAR | Status: DC | PRN
  Administered 2015-10-10: 25 ug via INTRAVENOUS

## 2015-10-10 MED ORDER — FENTANYL CITRATE (PF) 100 MCG/2ML IJ SOLN
50.0000 ug | Freq: Once | INTRAMUSCULAR | Status: AC
  Filled 2015-10-10: qty 2

## 2015-10-10 MED ORDER — HYDROCORTISONE NA SUCCINATE PF 100 MG IJ SOLR
50.0000 mg | Freq: Three times a day (TID) | INTRAMUSCULAR | Status: DC
  Administered 2015-10-10 – 2015-10-12 (×6): 50 mg via INTRAVENOUS
  Filled 2015-10-10 (×6): qty 1

## 2015-10-10 NOTE — Progress Notes (Signed)
[place] [facility] Progress Note Patient Name: Paul Morton DOB: December 04, 1961 MRN: [id number]   Date of Service  10/10/2015  HPI/Events of Note  Hypotensive  eICU Interventions  Neo     Intervention Category Major Interventions: Shock - evaluation and management  Tayshon Winker 10/10/2015, 11:24 PM

## 2015-10-10 NOTE — Progress Notes (Addendum)
Advanced Heart Failure Team Rounding Note  Subjective   Now on iHD but only pulling 25/hr. Yesterday developed recurrent cardiogenic shock with co-ox in the 77s. Dobutamine restarted. Remains on norepi. Feels better. Co-ox pending. CVP 10  Repeat echo 10/5. LVEF 10-15%. RV ok. No effusion .    Objective:    Vital Signs:   Temp:  [96.5 F (35.8 C)-98 F (36.7 C)] 97.6 F (36.4 C) (10/11 0806) Pulse Rate:  [101-115] 102 (10/11 1100) Resp:  [14-27] 14 (10/11 1100) BP: (74-98)/(44-81) 90/53 mmHg (10/11 1100) SpO2:  [95 %-100 %] 99 % (10/11 1100) Weight:  [71.6 kg (157 lb 13.6 oz)] 71.6 kg (157 lb 13.6 oz) (10/11 0400) Last BM Date: 10/10/15 (Flexiseal)  Weight change: Filed Weights   10/08/15 0400 10/09/15 0400 10/10/15 0400  Weight: 74 kg (163 lb 2.3 oz) 73.8 kg (162 lb 11.2 oz) 71.6 kg (157 lb 13.6 oz)    Intake/Output:   Intake/Output Summary (Last 24 hours) at 10/10/15 1120 Last data filed at 10/10/15 1100  Gross per 24 hour  Intake  691.2 ml  Output   2226 ml  Net -1534.8 ml     Physical Exam: CVP 10 General: lying in bed. Weak appearing. HEENT: normal  Neck: supple. RIJ TLC . LIJ trialysis cath  Cor: PMI laterally displaced. regular + s3 Lungs: clear anteriorly   Abdomen: soft, nontender, nondistended. No bruits or masses. Good bowel sounds. Extremities: no cyanosis, clubbing, rash, no edema.  Neuro: Awake following commands.   Telemetry: sinus tach. 100-110.   Labs: Basic Metabolic Panel:  Recent Labs Lab 10/06/15 0339  10/07/15 0402  10/08/15 0409 10/08/15 1550 10/09/15 0420 10/09/15 1614 10/10/15 0444  NA  --   < > 139  < > 137 135 138 137 134*  K  --   < > 4.5  < > 4.4 4.6 4.7 4.4 4.6  CL  --   < > 102  < > 99* 98* 101 102 99*  CO2  --   < > 27  < > 27 28 26 25 26   GLUCOSE  --   < > 97  < > 96 116* 108* 126* 110*  BUN  --   < > 16  < > 15 14 14 15 14   CREATININE  --   < > 1.15  < > 1.02 1.05 1.02 0.95 1.03  CALCIUM  --   < > 8.2*  < >  8.0* 7.8* 7.9* 7.5* 7.3*  MG 2.5*  --  2.7*  --  2.5*  --  2.4  --  2.5*  PHOS  --   < > 2.6  < > 2.4* 3.6 2.8 2.6 2.3*  < > = values in this interval not displayed.  Liver Function Tests:  Recent Labs Lab 10/06/15 0339  10/07/15 0402  10/08/15 0409 10/08/15 1550 10/09/15 0420 10/09/15 1614 10/10/15 0444  AST 55*  --  52*  --  57*  --  58*  --  44*  ALT 71*  --  64*  --  61  --  61  --  56  ALKPHOS 201*  --  196*  --  198*  --  213*  --  182*  BILITOT 7.2*  --  7.6*  --  8.6*  --  8.6*  8.8*  --  8.0*  PROT 5.4*  --  5.5*  --  5.6*  --  5.1*  --  5.1*  ALBUMIN 1.8*  < >  1.8*  < > 1.8*  1.8* 1.8* 1.8* 1.7* 1.7*  1.7*  < > = values in this interval not displayed. No results for input(s): LIPASE, AMYLASE in the last 168 hours. No results for input(s): AMMONIA in the last 168 hours.  CBC:  Recent Labs Lab 10/04/15 0415 10/04/15 2255 10/05/15 0354 10/06/15 0339 10/07/15 0402 10/08/15 1514 10/09/15 0420 10/10/15 0444  WBC 15.7* 15.5* 12.8* 16.7* 14.2*  --  3.1* 2.7*  NEUTROABS 14.9* 14.5*  --   --   --   --   --   --   HGB 9.6* 8.8* 8.7* 8.5* 8.2* 8.2* 8.2* 8.3*  HCT 29.5* 28.3* 27.6* 26.6* 25.4* 26.5* 26.5* 25.9*  MCV 74.3* 76.7* 75.4* 74.7* 74.7*  --  75.5* 75.7*  PLT 108* 147* 140* 139* 136*  --  157 111*    Cardiac Enzymes: No results for input(s): CKTOTAL, CKMB, CKMBINDEX, TROPONINI in the last 168 hours.  BNP: BNP (last 3 results) No results for input(s): BNP in the last 8760 hours.  ProBNP (last 3 results) No results for input(s): PROBNP in the last 8760 hours.   CBG:  Recent Labs Lab 10/08/15 1547 10/08/15 1956 10/09/15 10/09/15 0815 10/09/15 2343  GLUCAP 109* 104* 112* 102* 114*    Coagulation Studies: No results for input(s): LABPROT, INR in the last 72 hours.   Imaging: Dg Abd Portable 1v  10/09/2015   CLINICAL DATA:  Worsening abdominal pain, nausea and vomiting.  EXAM: PORTABLE ABDOMEN - 1 VIEW  COMPARISON:  10/08/2015 abdominal  radiograph.  FINDINGS: There is increasing mild gaseous distention of the stomach. There is a new mildly dilated featureless bowel loop in the central and left upper quadrant of the abdomen. There is a mildly dilated small bowel loop in the right abdomen. Minimal stool throughout the colon. Vascular calcifications are noted in the bilateral deep pelvis. No evidence of pneumatosis or pneumoperitoneum.  IMPRESSION: Increasing mild gaseous distention of the stomach. New mildly dilated small bowel loop in the right abdomen and additional new mildly dilated featureless bowel loop in the central/left upper abdomen. Findings are nonspecific and could indicate a mild adynamic ileus or mild distal small bowel obstruction.   Electronically Signed   By: Ilona Sorrel M.D.   On: 10/09/2015 16:58   Dg Abd Portable 1v  10/08/2015   CLINICAL DATA:  54 year old male with acute abdominal pain.  EXAM: PORTABLE ABDOMEN - 1 VIEW  COMPARISON:  Abdominal radiograph 10/05/2015.  FINDINGS: Previously noted feeding tube has been removed. Some gas within the stomach and small bowel. Paucity of colonic gas. No pathologic dilatation of small bowel or colon. No pneumoperitoneum.  IMPRESSION: 1. Nonspecific, nonobstructive bowel gas pattern, as above. 2. Interval removal of feeding tube.   Electronically Signed   By: Vinnie Langton M.D.   On: 10/08/2015 16:42     Medications:     Current Medications: . acyclovir  400 mg Per Tube Daily  . antiseptic oral rinse  7 mL Mouth Rinse BID  . dronabinol  5 mg Oral BID AC  . feeding supplement  1 Container Oral TID BM  . fluconazole  100 mg Oral Daily  . hydrocortisone sodium succinate  50 mg Intravenous Q6H  . pantoprazole (PROTONIX) IV  40 mg Intravenous Q12H    Infusions: . sodium chloride 10 mL/hr at 10/09/15 2000  . DOBUTamine 7.5 mcg/kg/min (10/09/15 2000)  . heparin 10,000 units/ 20 mL infusion syringe 2,500 Units/hr (10/10/15 0925)  . norepinephrine (LEVOPHED)  Adult  infusion 15 mcg/min (10/09/15 2000)  . dialysis replacement fluid (prismasate) 800 mL/hr at 10/10/15 0655  . dialysis replacement fluid (prismasate) 700 mL/hr at 10/10/15 0430  . dialysate (PRISMASATE) 1,500 mL/hr at 10/10/15 1012     Assessment:   1. Cardiogenic shock 2. Acute systolic HF EF 14-97%, etiology uncertain.  Brother has dilated cardiomyopathy so possibly familial.  Reports drinking 6 beers/day long-term.  3. Acute respiratory failure 4. Multisystem organ failure 5. Acute kidney injury 6. Shock liver 7. Hyperkalemia 8. Cervical lymphadenopathy - path appears to be Hodgkins lymphoma. Adcetria given 9/30    --s/p core biopsy 9/27 9. Tobacco use 10. ETOH use - 4+ beers per day 11. Family h/o NICM in brother 31. Massive upper GI bleed     --severe esophagitis by EGD on 9/27 13. Coagulopathy - due to shock liver with ? DIC 14. ?CAP  Plan/Discussion:    Failed wean of dobutamine yesterday due to recurrent cardiogenic shock. Now back on dual inotropes. Prognosis increasingly concerning with multisystem organ failure and persistent cardiogenic shock requiring dual pressors. Not candidate for mechanical support. Not candidate for outpatient HD on inotropes.   I think only plan is to try to get him off norepi and support with dobutamine and possibly midodrine which could be an outpatient regimen if we could find a way to dialyze him. But again realize that chance of survival seems very small.  The patient is critically ill with multiple organ systems failure and requires high complexity decision making for assessment and support, frequent evaluation and titration of therapies, application of advanced monitoring technologies and extensive interpretation of multiple databases.   Critical Care Time devoted to patient care services described in this note is 35 Minutes.    Bensimhon, Daniel,MD 11:20 AM Advanced Heart Failure Team Pager [phone] (M-F; 7a - 4p)  Please contact  [facility] for night-coverage after hours (4p -7a ) and weekends on [email or web address]

## 2015-10-10 NOTE — Progress Notes (Signed)
PULMONARY / CRITICAL CARE MEDICINE   Name: Paul Morton MRN: [id number] DOB: Mar 16, 1961    ADMISSION DATE:  09/18/2015  REFERRING MD :  EDP  CHIEF COMPLAINT:  Sepsis   INITIAL PRESENTATION:  54yo male smoker with recent diagnosis lymphoma (still in early stages of workup) who presented 9/25 with progressive back pain.  In ER found to be tachycardic, hypotensive with lactate 14, AKI and coagulopathy.  PCCM called to admit.  Found to have severe cardiomyopathy with EF 15%   STUDIES:  PET 9/22 - Bulky confluent hypermetabolic left cervical lymphadenopathy involving levels II-V. Given the presence of hepatomegaly with diffuse liver hypermetabolism, a diagnosis of lymphoma is favored, however metastatic carcinoma remains on the differential.  Hypermetabolic patchy ground-glass opacity and consolidation in both upper lobes and right lower lobe, favor an infectious or inflammatory etiology.  TTE 9/26 - EF 10-15% w/o regional wall motion abnormality. Grade 3 diastolic dysfunction. Moderate AR. Moderate MR. RV severely dilated w/ moderate reduction in systolic function. PASP 43 mmHg. No pericardial effusion.  RLE Venous Duplex 9/26 - no DVT or SVT  MRI spine 9/25 - incomplete exam, No evidence of significant spinal stenosis or cord compression in the cervical or visualized thoracic spine.   MRI L-spine 9/27 - suboptimal exam. No malignant or suspicious signal.  Echo repeat 10/8- no changes  SIGNIFICANT EVENTS: 9/25 - Admitted to hospital 9/25 - Right IJ CVC by ED 9/26 - Intubated for pending respiratory failure & [facility] - EGD w/ erosive esophagitis 9/27 - FNA neck mass 9/30 - Self extubation during SBT 10/1 - Reintubated  10/4 cvvhd started 10/5- hd cathter out, new placed, cvvhd restarted 10/6- extubated 10/7 - not  Responding to dobutamine  SUBJECTIVE:  Back on Dobutamine  , levophed at 6 mic C/o abd pain - improved , able  To take some applesauce -no  nausea/V Remains on CVVH   VITAL SIGNS: Temp:  [96.5 F (35.8 C)-98 F (36.7 C)] 97.6 F (36.4 C) (10/11 0806) Pulse Rate:  [101-115] 102 (10/11 1100) Resp:  [14-27] 14 (10/11 1100) BP: (74-98)/(44-81) 86/56 mmHg (10/11 1000) SpO2:  [95 %-100 %] 99 % (10/11 1100) Weight:  [71.6 kg (157 lb 13.6 oz)] 71.6 kg (157 lb 13.6 oz) (10/11 0400) HEMODYNAMICS: CVP:  [6 mmHg-11 mmHg] 11 mmHg VENTILATOR SETTINGS:   INTAKE / OUTPUT:  Intake/Output Summary (Last 24 hours) at 10/10/15 1104 Last data filed at 10/10/15 1100  Gross per 24 hour  Intake  691.2 ml  Output   2172 ml  Net -1480.8 ml    PHYSICAL EXAMINATION: General: chr ill,  Neck- no stridor, no jvd HEENT: , lymphad  decreased Cardiovascular:  s1 s2 rrt no m Pulmonary: decreased BL Abdomen: Soft, non tender Neurological: A O x 3  LABS:  CBC  Recent Labs Lab 10/07/15 0402 10/08/15 1514 10/09/15 0420 10/10/15 0444  WBC 14.2*  --  3.1* 2.7*  HGB 8.2* 8.2* 8.2* 8.3*  HCT 25.4* 26.5* 26.5* 25.9*  PLT 136*  --  157 111*   Coag's  Recent Labs Lab 10/05/15 1339  10/08/15 0409 10/09/15 0420 10/10/15 0444  APTT 44*  < > 86* 111* 132*  INR 1.24  --   --   --   --   < > = values in this interval not displayed. BMET  Recent Labs Lab 10/09/15 0420 10/09/15 1614 10/10/15 0444  NA 138 137 134*  K 4.7 4.4 4.6  CL 101 102 99*  CO2 26 25 26   BUN 14 15 14   CREATININE 1.02 0.95 1.03  GLUCOSE 108* 126* 110*   Electrolytes  Recent Labs Lab 10/08/15 0409  10/09/15 0420 10/09/15 1614 10/10/15 0444  CALCIUM 8.0*  < > 7.9* 7.5* 7.3*  MG 2.5*  --  2.4  --  2.5*  PHOS 2.4*  < > 2.8 2.6 2.3*  < > = values in this interval not displayed.   Sepsis Markers  Recent Labs Lab 10/07/15 1204  LATICACIDVEN 1.12   ABG No results for input(s): PHART, PCO2ART, PO2ART in the last 168 hours. Liver Enzymes  Recent Labs Lab 10/08/15 0409  10/09/15 0420 10/09/15 1614 10/10/15 0444  AST 57*  --  58*  --  44*   ALT 61  --  61  --  56  ALKPHOS 198*  --  213*  --  182*  BILITOT 8.6*  --  8.6*  8.8*  --  8.0*  ALBUMIN 1.8*  1.8*  < > 1.8* 1.7* 1.7*  1.7*  < > = values in this interval not displayed. Cardiac Enzymes No results for input(s): TROPONINI, PROBNP in the last 168 hours. Glucose  Recent Labs Lab 10/08/15 1132 10/08/15 1547 10/08/15 1956 10/09/15 10/09/15 0815 10/09/15 2343  GLUCAP 111* 109* 104* 112* 102* 114*    Imaging Dg Abd Portable 1v  10/09/2015   CLINICAL DATA:  Worsening abdominal pain, nausea and vomiting.  EXAM: PORTABLE ABDOMEN - 1 VIEW  COMPARISON:  10/08/2015 abdominal radiograph.  FINDINGS: There is increasing mild gaseous distention of the stomach. There is a new mildly dilated featureless bowel loop in the central and left upper quadrant of the abdomen. There is a mildly dilated small bowel loop in the right abdomen. Minimal stool throughout the colon. Vascular calcifications are noted in the bilateral deep pelvis. No evidence of pneumatosis or pneumoperitoneum.  IMPRESSION: Increasing mild gaseous distention of the stomach. New mildly dilated small bowel loop in the right abdomen and additional new mildly dilated featureless bowel loop in the central/left upper abdomen. Findings are nonspecific and could indicate a mild adynamic ileus or mild distal small bowel obstruction.   Electronically Signed   By: Ilona Sorrel M.D.   On: 10/09/2015 16:58    ASSESSMENT / PLAN:  PULMONARY ETT 9/26>>>9/30 (self extubated)>10/1  A: Acute hypoxic respiratory failure 2nd to HCAP with ARDS >> less likely pulmonary edema -resolved Hx of tobacco abuse. 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 Shock - combination of sepsis and cardiac. R/o  Relative adrenal insuff -cortisol dropped so significantly and on pressors weeks  Poor response  dobutmaine P:  MAp 60 Levo titrate down if able Ct stress steroids empiric  RENAL A: Acute Renal Failure >> likely from CHF, sepsis, and diuresis P:   cvvhd   IHD once off pressors  GASTROINTESTINAL A: Shock liver - improving. Upper GIB - secondary to erosive esophagitis on EGD. Protein calorie malnutrition. R/o ileus hyperbili (lymphoma ), cholestasis likely, shock liver component  Loose stools, prior cdiff neg P:   ppi renal diet flexi seal, imodium  Consider TNA if unable to tolerate PO - as last resort  HEMATOLOGIC A: New dx of Hodgkin's lymphoma Coagulopathy >> resolved. Anemia of critical illness, and GI bleed. Thrombocytopenia. P:  SCDs Chemotherapy per onc -hold off on CT neck Limit  phlebotomy  INFECTIOUS A: Resolved hcap P:   Vancomycin 9/25>>>off Zosyn 9/25>>>full course completed Zovirax for prophylaxis  Sputum 10/01 >>candida   ENDOCRINE A: Hyperglycemia. Rel AI P:   SSI if CBG > 180 Empiric steroids  NEUROLOGIC A: Delirium likely P:   Need aggressive PT  Summary - persistent shock ? Cardiogenic rather than septic - not a candidate for LVAD Cholestasis ? Related to shock liver vs cardiac congestion Palliative care consult appropriate - spoke to pt & wife -they are willing  The patient is critically ill with multiple organ systems failure and requires high complexity decision making for assessment and support, frequent evaluation and titration of therapies, application of advanced monitoring technologies and extensive interpretation of multiple databases. Critical Care Time devoted to patient care services described in this note independent of APP time is 32 minutes.   Kara Mead MD. Shade Flood. [facility] Pager [phone] If no response call [phone]

## 2015-10-10 NOTE — Progress Notes (Signed)
PT Cancellation Note  Patient Details Name: Paul Morton MRN: [id number] DOB: [birth year]   Cancelled Treatment:    Reason Eval/Treat Not Completed: Patient declined, no reason specified Pt politely declining participating in therapy today. Just finished meeting with palliative care and "not feeling up to it." Will follow up next available time.   Marguarite Arbour A Chassidy Layson 10/10/2015, 11:39 AM  Wray Kearns, PT, DPT [phone]

## 2015-10-10 NOTE — Progress Notes (Signed)
MD notified of persistent abdominal pain. MD ordered 39mcg Fentanyl Q2 hours as needed for pain. Patient given 43mcg of Fentanyl, abdominal pain persist. MD aware. Will continue to monitor patient appropriately and update as necessary.

## 2015-10-10 NOTE — Progress Notes (Signed)
Patient ID: Paul Morton, male   DOB: April 02, 1961, 55 y.o.   MRN: [id number] S:No appetite today O:BP 83/56 mmHg  Pulse 102  Temp(Src) 97.6 F (36.4 C) (Oral)  Resp 22  Ht 5' 11.5" (1.816 m)  Wt 71.6 kg (157 lb 13.6 oz)  BMI 21.71 kg/m2  SpO2 96%  Intake/Output Summary (Last 24 hours) at 10/10/15 0845 Last data filed at 10/10/15 0800  Gross per 24 hour  Intake  643.8 ml  Output   2165 ml  Net -1521.2 ml   Intake/Output: I/O last 3 completed shifts: In: 830.7 [I.V.:830.7] Out: 2979 [Other:1579; ZCHYI:5027]  Intake/Output this shift:  Total I/O In: 32.4 [I.V.:32.4] Out: 162 [Other:62; Stool:100] Weight change: -2.2 kg (-4 lb 13.6 oz) XAJ:OINOMVEHM WM in NAD CNO:BSJGG Resp:cta EZM:OQHUTM Ext:no edema   Recent Labs Lab 10/04/15 0415  10/06/15 0339  10/07/15 0402 10/07/15 1657 10/08/15 0409 10/08/15 1550 10/09/15 0420 10/09/15 1614 10/10/15 0444  NA 140  < >  --   < > 139 135 137 135 138 137 134*  K 4.3  < >  --   < > 4.5 4.4 4.4 4.6 4.7 4.4 4.6  CL 98*  < >  --   < > 102 99* 99* 98* 101 102 99*  CO2 29  < >  --   < > 27 28 27 28 26 25 26   GLUCOSE 105*  < >  --   < > 97 110* 96 116* 108* 126* 110*  BUN 87*  < >  --   < > 16 14 15 14 14 15 14   CREATININE 3.66*  < >  --   < > 1.15 1.15 1.02 1.05 1.02 0.95 1.03  ALBUMIN 1.9*  < > 1.8*  < > 1.8* 1.8* 1.8*  1.8* 1.8* 1.8* 1.7* 1.7*  1.7*  CALCIUM 7.9*  < >  --   < > 8.2* 7.9* 8.0* 7.8* 7.9* 7.5* 7.3*  PHOS 6.3*  < >  --   < > 2.6 2.4* 2.4* 3.6 2.8 2.6 2.3*  AST 66*  --  55*  --  52*  --  57*  --  58*  --  44*  ALT 85*  --  71*  --  64*  --  61  --  61  --  56  < > = values in this interval not displayed. Liver Function Tests:  Recent Labs Lab 10/08/15 0409  10/09/15 0420 10/09/15 1614 10/10/15 0444  AST 57*  --  58*  --  44*  ALT 61  --  61  --  56  ALKPHOS 198*  --  213*  --  182*  BILITOT 8.6*  --  8.6*  8.8*  --  8.0*  PROT 5.6*  --  5.1*  --  5.1*  ALBUMIN 1.8*  1.8*  < > 1.8* 1.7* 1.7*  1.7*  <  > = values in this interval not displayed. No results for input(s): LIPASE, AMYLASE in the last 168 hours. No results for input(s): AMMONIA in the last 168 hours. CBC:  Recent Labs Lab 10/04/15 0415 10/04/15 2255 10/05/15 0354 10/06/15 0339 10/07/15 0402 10/08/15 1514 10/09/15 0420 10/10/15 0444  WBC 15.7* 15.5* 12.8* 16.7* 14.2*  --  3.1* 2.7*  NEUTROABS 14.9* 14.5*  --   --   --   --   --   --   HGB 9.6* 8.8* 8.7* 8.5* 8.2* 8.2* 8.2* 8.3*  HCT 29.5* 28.3* 27.6* 26.6* 25.4*  26.5* 26.5* 25.9*  MCV 74.3* 76.7* 75.4* 74.7* 74.7*  --  75.5* 75.7*  PLT 108* 147* 140* 139* 136*  --  157 111*   Cardiac Enzymes: No results for input(s): CKTOTAL, CKMB, CKMBINDEX, TROPONINI in the last 168 hours. CBG:  Recent Labs Lab 10/08/15 1547 10/08/15 1956 10/09/15 10/09/15 0815 10/09/15 2343  GLUCAP 109* 104* 112* 102* 114*    Iron Studies: No results for input(s): IRON, TIBC, TRANSFERRIN, FERRITIN in the last 72 hours. Studies/Results: Dg Abd Portable 1v  10/09/2015   CLINICAL DATA:  Worsening abdominal pain, nausea and vomiting.  EXAM: PORTABLE ABDOMEN - 1 VIEW  COMPARISON:  10/08/2015 abdominal radiograph.  FINDINGS: There is increasing mild gaseous distention of the stomach. There is a new mildly dilated featureless bowel loop in the central and left upper quadrant of the abdomen. There is a mildly dilated small bowel loop in the right abdomen. Minimal stool throughout the colon. Vascular calcifications are noted in the bilateral deep pelvis. No evidence of pneumatosis or pneumoperitoneum.  IMPRESSION: Increasing mild gaseous distention of the stomach. New mildly dilated small bowel loop in the right abdomen and additional new mildly dilated featureless bowel loop in the central/left upper abdomen. Findings are nonspecific and could indicate a mild adynamic ileus or mild distal small bowel obstruction.   Electronically Signed   By: Ilona Sorrel M.D.   On: 10/09/2015 16:58   Dg Abd Portable  1v  10/08/2015   CLINICAL DATA:  54 year old male with acute abdominal pain.  EXAM: PORTABLE ABDOMEN - 1 VIEW  COMPARISON:  Abdominal radiograph 10/05/2015.  FINDINGS: Previously noted feeding tube has been removed. Some gas within the stomach and small bowel. Paucity of colonic gas. No pathologic dilatation of small bowel or colon. No pneumoperitoneum.  IMPRESSION: 1. Nonspecific, nonobstructive bowel gas pattern, as above. 2. Interval removal of feeding tube.   Electronically Signed   By: Vinnie Langton M.D.   On: 10/08/2015 16:42   . acyclovir  400 mg Per Tube Daily  . antiseptic oral rinse  7 mL Mouth Rinse BID  . dronabinol  5 mg Oral BID AC  . feeding supplement  1 Container Oral TID BM  . filgrastim  480 mcg Subcutaneous Once  . fluconazole  100 mg Oral Daily  . hydrocortisone sodium succinate  50 mg Intravenous Q6H  . pantoprazole (PROTONIX) IV  40 mg Intravenous Q12H    BMET    Component Value Date/Time   NA 134* 10/10/2015 0444   NA 136 09/12/2015 0909   K 4.6 10/10/2015 0444   K 4.6 09/12/2015 0909   CL 99* 10/10/2015 0444   CL 98 09/12/2015 0909   CO2 26 10/10/2015 0444   CO2 25 09/12/2015 0909   GLUCOSE 110* 10/10/2015 0444   GLUCOSE 92 09/12/2015 0909   BUN 14 10/10/2015 0444   BUN 20 09/12/2015 0909   CREATININE 1.03 10/10/2015 0444   CREATININE 1.2 09/12/2015 0909   CALCIUM 7.3* 10/10/2015 0444   CALCIUM 9.8 09/12/2015 0909   GFRNONAA >60 10/10/2015 0444   GFRAA >60 10/10/2015 0444   CBC    Component Value Date/Time   WBC 2.7* 10/10/2015 0444   WBC 13.3* 09/12/2015 0908   WBC 14.9* 09/08/2015 0931   RBC 3.42* 10/10/2015 0444   RBC 3.89* 09/12/2015 0908   RBC 4.14* 09/08/2015 0931   HGB 8.3* 10/10/2015 0444   HGB 9.9* 09/12/2015 0908   HGB 10.1* 09/08/2015 0931   HCT 25.9* 10/10/2015 0444  HCT 30.6* 09/12/2015 0908   HCT 32.3* 09/08/2015 0931   PLT 111* 10/10/2015 0444   PLT 528* 09/12/2015 0908   MCV 75.7* 10/10/2015 0444   MCV 79* 09/12/2015  0908   MCV 78.0* 09/08/2015 0931   MCH 24.3* 10/10/2015 0444   MCH 25.4* 09/12/2015 0908   MCH 24.3* 09/08/2015 0931   MCHC 32.0 10/10/2015 0444   MCHC 32.4 09/12/2015 0908   MCHC 31.2* 09/08/2015 0931   RDW 19.6* 10/10/2015 0444   RDW 16.6* 09/12/2015 0908   LYMPHSABS 0.7 10/04/2015 2255   LYMPHSABS 1.0 09/12/2015 0908   MONOABS 0.3 10/04/2015 2255   EOSABS 0.1 10/04/2015 2255   EOSABS 0.1 09/12/2015 0908   BASOSABS 0.0 10/04/2015 2255   BASOSABS 0.0 09/12/2015 0908     Assessment/Plan:  1. ARF, oliguric due to cardiogenic shock. Currently on pressors and CVVHD. Continue with current setting for now. Wean pressors as able.  If not able to wean pressors, there is really no viable long term solution for this issue.  If there is not an effective therapy available to improve his cardiac output and blood pressure, ongoing dialysis in any form would be futile.  Would strongly recommend palliative care consult to set goals and limits of care. 2. Cardiogenic shock due to acute systolic heart failure with EF 10-15% unclear etiology (?etoh related or familial). On levophed and dobutamine. 3. Hodgkins lymphoma- s/p Adcetria on 09/29/15 and per Heme has responded well to therapy but unclear if lymphadenopathy has responded. 4. Etoh use- 6 beers/day 5. Tobacco abuse 6. GI bleed due to severe esophagitis 7. Shock liver due to systolic heart failure/CMP 8. Coagulopathy 9. Disposition- pt has poor overall prognosis related to his severe CMP (EF 10-15%) without considering lymphoma and renal failure. Unclear what endpoint will be but recommend hospice/palliative care consult to set goals/limits of care.  As above without improved cardiac function and blood pressure, ongoing dialysis is futile.  [place]

## 2015-10-10 NOTE — Progress Notes (Addendum)
eLink Physician-Brief Progress Note Patient Name: Paul Morton DOB: March 10, 1961 MRN: [id number]   Date of Service  10/10/2015  HPI/Events of Note  Pt still has abd pain after fentanyl 25 mcg. Has watery diarrhea. He is not stable to get CT abd >> on norepi and dobutamine  eICU Interventions  Fentanyl 50 mcg one dose.  Abd x Ray. Check C Diff.     Intervention Category Intermediate Interventions: Other:  [name] 10/10/2015, 3:55 PM

## 2015-10-10 NOTE — Progress Notes (Signed)
Paul Morton is still doing okay. He is still extubated. He is on dialysis but hopefully this is going to be intermittent.  It looks like he is on both norepinephrine drip and dobutamine drip.  He is being followed by multiple specialists.  His appetite is not that great.  He denies any, pain. He's had no bleeding. He's had no nausea or vomiting.  His bilirubin is holding steady at 8. His creatinine is 1.03. His hemoglobin is 8.3. White cell count 2.7. His platelet count 111.  I will give him a dose of Neupogen just to get his white cell count up a little bit more just to try to help minimize risk of infection.  On his physical exam, his vital signs are temperature of 97.5. Pulse 110. Blood pressure 85/59. Oral exam does show some thrush. I will go ahead and give him some Diflucan. Lungs show some decrease at the bases. Cardiac exam tachycardic but regular. There is an occasional extra beat. I hear no obvious murmurs. Abdomen is soft. He has no fluid wave. There is no palpable liver or spleen tip. Extremities shows no clubbing, cyanosis or edema. Skin exam shows no rashes.  I think would be interesting to get a CT scan of his neck so we can evaluate for his lymphadenopathy. This will give [place] a good idea as to how well he is responded  I will start him on some Diflucan.  I will give him some Neupogen.  I appreciate all the great care that he is getting from the staff in the ICU. You all are doing a fantastic job. I just wish he could get off his pressors.  Ramond Dial 1:37

## 2015-10-11 DIAGNOSIS — A0472 Enterocolitis due to Clostridium difficile, not specified as recurrent: Secondary | ICD-10-CM | POA: Insufficient documentation

## 2015-10-11 DIAGNOSIS — A047 Enterocolitis due to Clostridium difficile: Secondary | ICD-10-CM

## 2015-10-11 DIAGNOSIS — C8190 Hodgkin lymphoma, unspecified, unspecified site: Secondary | ICD-10-CM

## 2015-10-11 LAB — RENAL FUNCTION PANEL
ALBUMIN: 1.6 g/dL — AB (ref 3.5–5.0)
ANION GAP: 9 (ref 5–15)
ANION GAP: 9 (ref 5–15)
Albumin: 1.5 g/dL — ABNORMAL LOW (ref 3.5–5.0)
BUN: 7 mg/dL (ref 6–20)
BUN: 10 mg/dL (ref 6–20)
CALCIUM: 7 mg/dL — AB (ref 8.9–10.3)
CO2: 27 mmol/L (ref 22–32)
CO2: 26 mmol/L (ref 22–32)
CREATININE: 0.92 mg/dL (ref 0.61–1.24)
Calcium: 7.2 mg/dL — ABNORMAL LOW (ref 8.9–10.3)
Chloride: 98 mmol/L — ABNORMAL LOW (ref 101–111)
Chloride: 99 mmol/L — ABNORMAL LOW (ref 101–111)
Creatinine, Ser: 0.97 mg/dL (ref 0.61–1.24)
GFR calc Af Amer: >60 mL/min (ref >60)
GFR calc Af Amer: >60 mL/min (ref >60)
GFR calc non Af Amer: >60 mL/min (ref >60)
GFR calc non Af Amer: >60 mL/min (ref >60)
GLUCOSE: 123 mg/dL — AB (ref 65–99)
GLUCOSE: 135 mg/dL — AB (ref 65–99)
PHOSPHORUS: 2 mg/dL — AB (ref 2.5–4.6)
POTASSIUM: 4 mmol/L (ref 3.5–5.1)
Phosphorus: 2 mg/dL — ABNORMAL LOW (ref 2.5–4.6)
Potassium: 4.3 mmol/L (ref 3.5–5.1)
SODIUM: 134 mmol/L — AB (ref 135–145)
SODIUM: 134 mmol/L — AB (ref 135–145)

## 2015-10-11 LAB — BASIC METABOLIC PANEL
ANION GAP: 8 (ref 5–15)
BUN: 9 mg/dL (ref 6–20)
CO2: 27 mmol/L (ref 22–32)
CREATININE: 1 mg/dL (ref 0.61–1.24)
Calcium: 7.1 mg/dL — ABNORMAL LOW (ref 8.9–10.3)
Chloride: 99 mmol/L — ABNORMAL LOW (ref 101–111)
GFR calc Af Amer: >60 mL/min (ref >60)
GFR calc non Af Amer: >60 mL/min (ref >60)
GLUCOSE: 121 mg/dL — AB (ref 65–99)
Potassium: 4 mmol/L (ref 3.5–5.1)
Sodium: 134 mmol/L — ABNORMAL LOW (ref 135–145)

## 2015-10-11 LAB — CBC
HEMATOCRIT: 26.9 % — AB (ref 39.0–52.0)
Hemoglobin: 8.8 g/dL — ABNORMAL LOW (ref 13.0–17.0)
MCH: 24.1 pg — AB (ref 26.0–34.0)
MCHC: 32.7 g/dL (ref 30.0–36.0)
MCV: 73.7 fL — AB (ref 78.0–100.0)
PLATELETS: 86 10*3/uL — AB (ref 150–400)
RBC: 3.65 MIL/uL — ABNORMAL LOW (ref 4.22–5.81)
RDW: 20.3 % — AB (ref 11.5–15.5)
WBC: 4.6 10*3/uL (ref 4.0–10.5)

## 2015-10-11 LAB — HEPATIC FUNCTION PANEL
ALBUMIN: 1.6 g/dL — AB (ref 3.5–5.0)
ALK PHOS: 177 U/L — AB (ref 38–126)
ALT: 53 U/L (ref 17–63)
AST: 40 U/L (ref 15–41)
BILIRUBIN TOTAL: 8.5 mg/dL — AB (ref 0.3–1.2)
Bilirubin, Direct: 5.3 mg/dL — ABNORMAL HIGH (ref 0.1–0.5)
Indirect Bilirubin: 3.2 mg/dL — ABNORMAL HIGH (ref 0.3–0.9)
TOTAL PROTEIN: 4.7 g/dL — AB (ref 6.5–8.1)

## 2015-10-11 LAB — MAGNESIUM: Magnesium: 2.3 mg/dL (ref 1.7–2.4)

## 2015-10-11 LAB — CARBOXYHEMOGLOBIN
CARBOXYHEMOGLOBIN: 1.8 % — AB (ref 0.5–1.5)
Methemoglobin: 0.8 % (ref 0.0–1.5)
O2 SAT: 41.1 %
Total hemoglobin: 9.5 g/dL — ABNORMAL LOW (ref 13.5–18.0)

## 2015-10-11 LAB — GLUCOSE, CAPILLARY
GLUCOSE-CAPILLARY: 118 mg/dL — AB (ref 65–99)
Glucose-Capillary: 125 mg/dL — ABNORMAL HIGH (ref 65–99)
Glucose-Capillary: 132 mg/dL — ABNORMAL HIGH (ref 65–99)

## 2015-10-11 LAB — POCT ACTIVATED CLOTTING TIME
ACTIVATED CLOTTING TIME: 214 s
Activated Clotting Time: 183 seconds
Activated Clotting Time: 177 seconds
Activated Clotting Time: 208 seconds
Activated Clotting Time: 208 seconds

## 2015-10-11 LAB — APTT: aPTT: 122 seconds — ABNORMAL HIGH (ref 24–37)

## 2015-10-11 MED ORDER — METRONIDAZOLE IN NACL 5-0.79 MG/ML-% IV SOLN
500.0000 mg | Freq: Three times a day (TID) | INTRAVENOUS | Status: DC
  Administered 2015-10-11 – 2015-10-13 (×7): 500 mg via INTRAVENOUS
  Filled 2015-10-11 (×8): qty 100

## 2015-10-11 MED ORDER — VANCOMYCIN 50 MG/ML ORAL SOLUTION
500.0000 mg | Freq: Four times a day (QID) | ORAL | Status: DC
  Filled 2015-10-11 (×4): qty 10

## 2015-10-11 MED ORDER — VANCOMYCIN 50 MG/ML ORAL SOLUTION
500.0000 mg | Freq: Four times a day (QID) | ORAL | Status: DC
  Administered 2015-10-11 – 2015-10-13 (×7): 500 mg via ORAL
  Filled 2015-10-11 (×12): qty 10

## 2015-10-11 MED ORDER — VASOPRESSIN 20 UNIT/ML IV SOLN
0.0300 [IU]/min | INTRAVENOUS | Status: DC
  Administered 2015-10-11 – 2015-10-13 (×3): 0.03 [IU]/min via INTRAVENOUS
  Filled 2015-10-11 (×3): qty 2

## 2015-10-11 NOTE — Progress Notes (Signed)
Advanced Heart Failure Team Rounding Note  Subjective CO-OX 41% despite dobutamine 7.5  + full dose norepi + vasopressin + neo.    Repeat echo 10/5. LVEF 10-15%. RV ok. No effusion .    Objective:    Vital Signs:   Temp:  [96 F (35.6 C)-97.5 F (36.4 C)] 97.3 F (36.3 C) (10/12 1158) Pulse Rate:  [91-115] 109 (10/12 1245) Resp:  [11-30] 17 (10/12 1245) BP: (64-102)/(38-81) 82/51 mmHg (10/12 1245) SpO2:  [94 %-100 %] 100 % (10/12 1230) Weight:  [156 lb 4.9 oz (70.9 kg)] 156 lb 4.9 oz (70.9 kg) (10/12 0434) Last BM Date: 10/11/15  Weight change: Filed Weights   10/09/15 0400 10/10/15 0400 10/11/15 0434  Weight: 162 lb 11.2 oz (73.8 kg) 157 lb 13.6 oz (71.6 kg) 156 lb 4.9 oz (70.9 kg)    Intake/Output:   Intake/Output Summary (Last 24 hours) at 10/11/15 1305 Last data filed at 10/11/15 1200  Gross per 24 hour  Intake 1271.9 ml  Output   1747 ml  Net -475.1 ml     Physical Exam: CVP 5 General: lying in bed. Weak appearing. Temporal wasting HEENT: normal x for scleral icterus Neck: supple. RIJ TLC . LIJ trialysis cath  Cor: PMI laterally displaced. regular + s3 Lungs: clear anteriorly   Abdomen: soft, nontender, nondistended. No bruits or masses. Good bowel sounds. Extremities: no cyanosis, clubbing, rash, no edema.  Neuro: Awake following commands.   Telemetry: sinus tach. 100-110.   Labs: Basic Metabolic Panel:  Recent Labs Lab 10/07/15 0402  10/08/15 0409  10/09/15 0420 10/09/15 1614 10/10/15 0444 10/10/15 1600 10/11/15 0440  NA 139  < > 137  < > 138 137 134* 136 134*  134*  K 4.5  < > 4.4  < > 4.7 4.4 4.6 4.2 4.0  4.0  CL 102  < > 99*  < > 101 102 99* 101 99*  98*  CO2 27  < > 27  < > 26 25 26 25 27  27   GLUCOSE 97  < > 96  < > 108* 126* 110* 136* 121*  123*  BUN 16  < > 15  < > 14 15 14 11 9  7   CREATININE 1.15  < > 1.02  < > 1.02 0.95 1.03 1.02 1.00  0.97  CALCIUM 8.2*  < > 8.0*  < > 7.9* 7.5* 7.3* 7.6* 7.1*  7.2*  MG 2.7*  --  2.5*   --  2.4  --  2.5*  --  2.3  PHOS 2.6  < > 2.4*  < > 2.8 2.6 2.3* 2.1* 2.0*  < > = values in this interval not displayed.  Liver Function Tests:  Recent Labs Lab 10/07/15 0402  10/08/15 0409  10/09/15 0420 10/09/15 1614 10/10/15 0444 10/10/15 1600 10/11/15 0440  AST 52*  --  57*  --  58*  --  44*  --  40  ALT 64*  --  61  --  61  --  56  --  53  ALKPHOS 196*  --  198*  --  213*  --  182*  --  177*  BILITOT 7.6*  --  8.6*  --  8.6*  8.8*  --  8.0*  --  8.5*  PROT 5.5*  --  5.6*  --  5.1*  --  5.1*  --  4.7*  ALBUMIN 1.8*  < > 1.8*  1.8*  < > 1.8* 1.7* 1.7*  1.7* 1.6*  1.6*  1.6*  < > = values in this interval not displayed. No results for input(s): LIPASE, AMYLASE in the last 168 hours. No results for input(s): AMMONIA in the last 168 hours.  CBC:  Recent Labs Lab 10/04/15 2255  10/06/15 0339 10/07/15 0402 10/08/15 1514 10/09/15 0420 10/10/15 0444 10/11/15 0440  WBC 15.5*  < > 16.7* 14.2*  --  3.1* 2.7* 4.6  NEUTROABS 14.5*  --   --   --   --   --   --   --   HGB 8.8*  < > 8.5* 8.2* 8.2* 8.2* 8.3* 8.8*  HCT 28.3*  < > 26.6* 25.4* 26.5* 26.5* 25.9* 26.9*  MCV 76.7*  < > 74.7* 74.7*  --  75.5* 75.7* 73.7*  PLT 147*  < > 139* 136*  --  157 111* 86*  < > = values in this interval not displayed.  Cardiac Enzymes: No results for input(s): CKTOTAL, CKMB, CKMBINDEX, TROPONINI in the last 168 hours.  BNP: BNP (last 3 results) No results for input(s): BNP in the last 8760 hours.  ProBNP (last 3 results) No results for input(s): PROBNP in the last 8760 hours.   CBG:  Recent Labs Lab 10/09/15 0815 10/09/15 2343 10/10/15 0421 10/11/15 0812 10/11/15 1153  GLUCAP 102* 114* 102* 118* 125*    Coagulation Studies: No results for input(s): LABPROT, INR in the last 72 hours.   Imaging: Dg Abd Portable 1v  10/10/2015  CLINICAL DATA:  Severe abdominal pain with nausea EXAM: PORTABLE ABDOMEN - 1 VIEW COMPARISON:  10/09/2015 FINDINGS: The bowel gas pattern is  normal. No radio-opaque calculi or other significant radiographic abnormality are seen. IMPRESSION: No bowel obstruction. Electronically Signed   By: Kathreen Devoid   On: 10/10/2015 16:48   Dg Abd Portable 1v  10/09/2015  CLINICAL DATA:  Worsening abdominal pain, nausea and vomiting. EXAM: PORTABLE ABDOMEN - 1 VIEW COMPARISON:  10/08/2015 abdominal radiograph. FINDINGS: There is increasing mild gaseous distention of the stomach. There is a new mildly dilated featureless bowel loop in the central and left upper quadrant of the abdomen. There is a mildly dilated small bowel loop in the right abdomen. Minimal stool throughout the colon. Vascular calcifications are noted in the bilateral deep pelvis. No evidence of pneumatosis or pneumoperitoneum. IMPRESSION: Increasing mild gaseous distention of the stomach. New mildly dilated small bowel loop in the right abdomen and additional new mildly dilated featureless bowel loop in the central/left upper abdomen. Findings are nonspecific and could indicate a mild adynamic ileus or mild distal small bowel obstruction. Electronically Signed   By: Ilona Sorrel M.D.   On: 10/09/2015 16:58     Medications:     Current Medications: . acyclovir  400 mg Per Tube Daily  . antiseptic oral rinse  7 mL Mouth Rinse BID  . dronabinol  5 mg Oral BID AC  . feeding supplement  1 Container Oral TID BM  . fluconazole  100 mg Oral Daily  . hydrocortisone sodium succinate  50 mg Intravenous Q8H  . metronidazole  500 mg Intravenous Q8H  . pantoprazole (PROTONIX) IV  40 mg Intravenous Q12H  . vancomycin  500 mg Oral 4 times per day    Infusions: . sodium chloride 10 mL/hr at 10/09/15 2000  . DOBUTamine 7.5 mcg/kg/min (10/10/15 2020)  . heparin 10,000 units/ 20 mL infusion syringe 2,500 Units/hr (10/11/15 0936)  . norepinephrine (LEVOPHED) Adult infusion 40 mcg/min (10/11/15 1038)  . phenylephrine (NEO-SYNEPHRINE) Adult infusion 30  mcg/min (10/10/15 2357)  . dialysis  replacement fluid (prismasate) 800 mL/hr at 10/11/15 0947  . dialysis replacement fluid (prismasate) 700 mL/hr at 10/11/15 1131  . dialysate (PRISMASATE) 1,500 mL/hr at 10/11/15 1131  . vasopressin (PITRESSIN) infusion - *FOR SHOCK* 0.03 Units/min (10/11/15 1212)     Assessment:   1. Cardiogenic shock 2. Acute systolic HF EF 74-16%, etiology uncertain.  Brother has dilated cardiomyopathy so possibly familial.  Reports drinking 6 beers/day long-term.  3. Acute respiratory failure 4. Multisystem organ failure 5. Acute kidney injury 6. Shock liver 7. Hyperkalemia 8. Cervical lymphadenopathy - path appears to be Hodgkins lymphoma. Adcetria given 9/30    --s/p core biopsy 9/27 9. Tobacco use 10. ETOH use - 4+ beers per day 11. Family h/o NICM in brother 57. Massive upper GI bleed     --severe esophagitis by EGD on 9/27 13. Coagulopathy - due to shock liver with ? DIC 14. ?CAP  Plan/Discussion:    Remains in cardiogenic shock despite 4 pressors.  Prognosis increasingly concerning with multisystem organ failure and persistent cardiogenic shock . Not candidate for mechanical support. Not candidate for outpatient HD on inotropes.   The patient is critically ill with multiple organ systems failure and requires high complexity decision making for assessment and support, frequent evaluation and titration of therapies, application of advanced monitoring technologies and extensive interpretation of multiple databases.   Critical Care Time devoted to patient care services described in this note is 35 Minutes.    Darrick Grinder, NP-C  1:05 PM Advanced Heart Failure Team Pager [phone] (M-F; 7a - 4p)  Please contact [facility] for night-coverage after hours (4p -7a ) and weekends on [email or web address]   Patient seen and examined with Darrick Grinder, NP. We discussed all aspects of the encounter. I agree with the assessment and plan as stated above.   Co-ox remain very low despite triple pressors. He  now has refractory cardiogenic shock. I had frank talk with him and said we really have nothing left to offer and he is likely to get worse quickly despite maximal therapy. Agree with plan to switch to comfort care when he and his family are ready.   The patient is critically ill with multiple organ systems failure and requires high complexity decision making for assessment and support, frequent evaluation and titration of therapies, application of advanced monitoring technologies and extensive interpretation of multiple databases.   Critical Care Time devoted to patient care services described in this note is 35 Minutes.  Sisto Granillo,MD 1:16 PM

## 2015-10-11 NOTE — Progress Notes (Addendum)
Mr. Clingan appears be on 3 different pressors now. Again, this is all based on his poor cardiac function.  He has not had the CAT scan done yet. Maybe he cannot have a CAT scan because of the dialysis that he is on.  His labs shows bilirubin to be 8.5. This is holding pretty steady. His white cell count is 4.6. Platelet count 86,000. Hemoglobin 8.8. I'm sure that he has a very low erythropoietin level.  Otherwise, there really is not much else that we need to consider with him. It will be 2 weeks on Friday that he has had treatment. The adenopathy is resolving nicely. It would be nice to have a CT scan to assess for the decrease in lymphadenopathy but maybe this just cannot be done right now.  On his physical exam, his temperature is 97.5. Pulse 107. Blood pressure 93/58. His hent exam shows some palpable lymph nodes in the left supraclavicular fossa. This measures about a centimeter. No other adenopathy is noted. The thrush in his mouth has resolved. Lungs are pretty clear bilaterally. Cardiac exam is tachycardic but regular. Abdomen is soft. There is no fluid wave. There is no palpable liver or spleen tip. Extremities shows no clubbing, cyanosis or edema. Skin exam shows no rashes.  From a standpoint of his Hodgkin's disease, he has had one cycle of treatment with at citrus. He has responded clinically. Again, it would be nice to check radiographically for a response but we just may not be able to do that right now with him being on dialysis.  The fact that he is on 3 different pressors now is quite troublesome. I'm unsure how much his heart is going to be able to improve. Another cardiology is doing a fantastic job trying to improve his cardiac function.  We will continue the Diflucan for now. His thrush appears to have been resolved.  We will continue to follow along. I appreciate the outstanding care that he is getting from everybody in the ICU!!!!  Paul E.  Ephesians 6:10

## 2015-10-11 NOTE — Progress Notes (Signed)
Patient ID: Paul Morton, male   DOB: 09-Mar-1961, 54 y.o.   MRN: [id number] S:feeling worse today O:BP 83/58 mmHg  Pulse 107  Temp(Src) 97.4 F (36.3 C) (Oral)  Resp 18  Ht 5' 11.5" (1.816 m)  Wt 70.9 kg (156 lb 4.9 oz)  BMI 21.50 kg/m2  SpO2 99%  Intake/Output Summary (Last 24 hours) at 10/11/15 0828 Last data filed at 10/11/15 0800  Gross per 24 hour  Intake 1065.5 ml  Output   2091 ml  Net -1025.5 ml   Intake/Output: I/O last 3 completed shifts: In: 1424.3 [I.V.:1424.3] Out: 3736 [Other:1711; Stool:2025]  Intake/Output this shift:  Total I/O In: 62.4 [I.V.:62.4] Out: 43 [Other:43] Weight change: -0.7 kg (-1 lb 8.7 oz) ZHG:DJMEQASTM, jaundiced male who appears fatigued HDQ:QIWLN Resp:cta Abd:+BS Ext:no edema   Recent Labs Lab 10/06/15 0339  10/07/15 0402  10/08/15 0409 10/08/15 1550 10/09/15 0420 10/09/15 1614 10/10/15 0444 10/10/15 1600 10/11/15 0440  NA  --   < > 139  < > 137 135 138 137 134* 136 134*  134*  K  --   < > 4.5  < > 4.4 4.6 4.7 4.4 4.6 4.2 4.0  4.0  CL  --   < > 102  < > 99* 98* 101 102 99* 101 99*  98*  CO2  --   < > 27  < > 27 28 26 25 26 25 27  27   GLUCOSE  --   < > 97  < > 96 116* 108* 126* 110* 136* 121*  123*  BUN  --   < > 16  < > 15 14 14 15 14 11 9  7   CREATININE  --   < > 1.15  < > 1.02 1.05 1.02 0.95 1.03 1.02 1.00  0.97  ALBUMIN 1.8*  < > 1.8*  < > 1.8*  1.8* 1.8* 1.8* 1.7* 1.7*  1.7* 1.6* 1.6*  1.6*  CALCIUM  --   < > 8.2*  < > 8.0* 7.8* 7.9* 7.5* 7.3* 7.6* 7.1*  7.2*  PHOS  --   < > 2.6  < > 2.4* 3.6 2.8 2.6 2.3* 2.1* 2.0*  AST 55*  --  52*  --  57*  --  58*  --  44*  --  40  ALT 71*  --  64*  --  61  --  61  --  56  --  53  < > = values in this interval not displayed. Liver Function Tests:  Recent Labs Lab 10/09/15 0420  10/10/15 0444 10/10/15 1600 10/11/15 0440  AST 58*  --  44*  --  40  ALT 61  --  56  --  53  ALKPHOS 213*  --  182*  --  177*  BILITOT 8.6*  8.8*  --  8.0*  --  8.5*  PROT 5.1*  --   5.1*  --  4.7*  ALBUMIN 1.8*  < > 1.7*  1.7* 1.6* 1.6*  1.6*  < > = values in this interval not displayed. No results for input(s): LIPASE, AMYLASE in the last 168 hours. No results for input(s): AMMONIA in the last 168 hours. CBC:  Recent Labs Lab 10/04/15 2255  10/06/15 0339 10/07/15 0402  10/09/15 0420 10/10/15 0444 10/11/15 0440  WBC 15.5*  < > 16.7* 14.2*  --  3.1* 2.7* 4.6  NEUTROABS 14.5*  --   --   --   --   --   --   --  HGB 8.8*  < > 8.5* 8.2*  < > 8.2* 8.3* 8.8*  HCT 28.3*  < > 26.6* 25.4*  < > 26.5* 25.9* 26.9*  MCV 76.7*  < > 74.7* 74.7*  --  75.5* 75.7* 73.7*  PLT 147*  < > 139* 136*  --  157 111* 86*  < > = values in this interval not displayed. Cardiac Enzymes: No results for input(s): CKTOTAL, CKMB, CKMBINDEX, TROPONINI in the last 168 hours. CBG:  Recent Labs Lab 10/08/15 1956 10/09/15 10/09/15 0815 10/09/15 2343 10/10/15 0421  GLUCAP 104* 112* 102* 114* 102*    Iron Studies: No results for input(s): IRON, TIBC, TRANSFERRIN, FERRITIN in the last 72 hours. Studies/Results: Dg Abd Portable 1v  10/10/2015  CLINICAL DATA:  Severe abdominal pain with nausea EXAM: PORTABLE ABDOMEN - 1 VIEW COMPARISON:  10/09/2015 FINDINGS: The bowel gas pattern is normal. No radio-opaque calculi or other significant radiographic abnormality are seen. IMPRESSION: No bowel obstruction. Electronically Signed   By: Kathreen Devoid   On: 10/10/2015 16:48   Dg Abd Portable 1v  10/09/2015  CLINICAL DATA:  Worsening abdominal pain, nausea and vomiting. EXAM: PORTABLE ABDOMEN - 1 VIEW COMPARISON:  10/08/2015 abdominal radiograph. FINDINGS: There is increasing mild gaseous distention of the stomach. There is a new mildly dilated featureless bowel loop in the central and left upper quadrant of the abdomen. There is a mildly dilated small bowel loop in the right abdomen. Minimal stool throughout the colon. Vascular calcifications are noted in the bilateral deep pelvis. No evidence of  pneumatosis or pneumoperitoneum. IMPRESSION: Increasing mild gaseous distention of the stomach. New mildly dilated small bowel loop in the right abdomen and additional new mildly dilated featureless bowel loop in the central/left upper abdomen. Findings are nonspecific and could indicate a mild adynamic ileus or mild distal small bowel obstruction. Electronically Signed   By: Ilona Sorrel M.D.   On: 10/09/2015 16:58   . acyclovir  400 mg Per Tube Daily  . antiseptic oral rinse  7 mL Mouth Rinse BID  . dronabinol  5 mg Oral BID AC  . feeding supplement  1 Container Oral TID BM  . fluconazole  100 mg Oral Daily  . hydrocortisone sodium succinate  50 mg Intravenous Q8H  . pantoprazole (PROTONIX) IV  40 mg Intravenous Q12H    BMET    Component Value Date/Time   NA 134* 10/11/2015 0440   NA 134* 10/11/2015 0440   NA 136 09/12/2015 0909   K 4.0 10/11/2015 0440   K 4.0 10/11/2015 0440   K 4.6 09/12/2015 0909   CL 98* 10/11/2015 0440   CL 99* 10/11/2015 0440   CL 98 09/12/2015 0909   CO2 27 10/11/2015 0440   CO2 27 10/11/2015 0440   CO2 25 09/12/2015 0909   GLUCOSE 123* 10/11/2015 0440   GLUCOSE 121* 10/11/2015 0440   GLUCOSE 92 09/12/2015 0909   BUN 7 10/11/2015 0440   BUN 9 10/11/2015 0440   BUN 20 09/12/2015 0909   CREATININE 0.97 10/11/2015 0440   CREATININE 1.00 10/11/2015 0440   CREATININE 1.2 09/12/2015 0909   CALCIUM 7.2* 10/11/2015 0440   CALCIUM 7.1* 10/11/2015 0440   CALCIUM 9.8 09/12/2015 0909   GFRNONAA >60 10/11/2015 0440   GFRNONAA >60 10/11/2015 0440   GFRAA >60 10/11/2015 0440   GFRAA >60 10/11/2015 0440   CBC    Component Value Date/Time   WBC 4.6 10/11/2015 0440   WBC 13.3* 09/12/2015 0908   WBC  14.9* 09/08/2015 0931   RBC 3.65* 10/11/2015 0440   RBC 3.89* 09/12/2015 0908   RBC 4.14* 09/08/2015 0931   HGB 8.8* 10/11/2015 0440   HGB 9.9* 09/12/2015 0908   HGB 10.1* 09/08/2015 0931   HCT 26.9* 10/11/2015 0440   HCT 30.6* 09/12/2015 0908   HCT 32.3*  09/08/2015 0931   PLT 86* 10/11/2015 0440   PLT 528* 09/12/2015 0908   MCV 73.7* 10/11/2015 0440   MCV 79* 09/12/2015 0908   MCV 78.0* 09/08/2015 0931   MCH 24.1* 10/11/2015 0440   MCH 25.4* 09/12/2015 0908   MCH 24.3* 09/08/2015 0931   MCHC 32.7 10/11/2015 0440   MCHC 32.4 09/12/2015 0908   MCHC 31.2* 09/08/2015 0931   RDW 20.3* 10/11/2015 0440   RDW 16.6* 09/12/2015 0908   LYMPHSABS 0.7 10/04/2015 2255   LYMPHSABS 1.0 09/12/2015 0908   MONOABS 0.3 10/04/2015 2255   EOSABS 0.1 10/04/2015 2255   EOSABS 0.1 09/12/2015 0908   BASOSABS 0.0 10/04/2015 2255   BASOSABS 0.0 09/12/2015 0908     Assessment/Plan:  1. ARF, oliguric due to cardiogenic shock. Currently on pressors and CVVHD. Continue with current setting for now. Wean pressors as able. If not able to wean pressors, there is really no viable long term solution for this issue. If there is not an effective therapy available to improve his cardiac output and blood pressure, ongoing dialysis in any form would be futile. 1. Despite 3 pressors, he remains hypotensive and is barely tolerating CVVHD.  He would never be able to tolerate IHD and therefore would recommend transitioning to comfort care. 2. Cardiogenic shock due to acute systolic heart failure with EF 10-15% unclear etiology (?etoh related or familial). On levophed, dobutamine, and now neo gtt but still remains hypotensive.   1. Pt is deteriorating despite maximal effort from all involved.   2. Strongly recommend transition to comfort measures. 3. Hodgkins lymphoma- s/p Adcetria on 09/29/15 and per Heme has responded well to therapy but unclear if lymphadenopathy has responded. 4. Etoh use- 6 beers/day 5. Tobacco abuse 6. GI bleed due to severe esophagitis 7. Shock liver due to systolic heart failure/CMP 8. Coagulopathy 9. Disposition- pt has poor overall prognosis related to his severe CMP (EF 10-15%) without considering lymphoma and renal failure. Unclear what  endpoint will be but recommend hospice/palliative care consult to set goals/limits of care. As above without improved cardiac function and blood pressure, ongoing dialysis is futile.  [place] A

## 2015-10-11 NOTE — Consult Note (Signed)
Consultation Note Date: 10/11/2015   Patient Name: Paul Morton  DOB: [birth year]  MRN: [id number]  Age / Sex: 54 y.o., male   PCP: No Pcp Per Patient Referring Physician: Raylene Miyamoto, MD  Reason for Consultation: Establishing goals of care and Psychosocial/spiritual support, introduction of palliative Medicine into holistic plan of care  Palliative Care Assessment and Plan Summary of Established Goals of Care and Medical Treatment Preferences    Palliative Care Discussion Held Today:    This NP Wadie Lessen reviewed medical records, received report from team, assessed the patient and then meet at the patient's bedside along with his wife Paul Morton  to discuss diagnosis and prognosis ( discussed renal and cardiac disease specifically)  GOC,  and emotional impact of serious, life threatening illness.  A discussion was had today regarding advanced directives.  Concepts specific to code status,was had.  Values and goals of care important to patient and family were attempted to be elicited.  Patient's wife was very guarded.  She was not able to verbalize the seriousness of her husband's condition and actually asked questions more about "how long would it be before he got better" and talked about her plans for discharge.  We discussed the importance of taking  one day at a time.    Patient expresses concern for his wife and 2 yo daughter if "I'm not here anymore".    Concept of Palliative Care was discussed  Questions and concerns addressed.   Family encouraged to call with questions or concerns.  PMT will continue to support holistically.    Primary Decision Maker: Patient and his wife  Goals of Care/Code Status/Advance Care Planning:   Both patient and wife remain hopeful for improvement and are open to all offered and available medical interventions to prolong life.   Psycho-social/Spiritual:    Support System: family  Desire for further Chaplaincy support:   Declines at  this time  Prognosis: Poor prognosis  Discharge Planning:  Pending outcomes and decsions       Chief Complaint:   GI Bleed  History of Present Illness:   54yo male smoker with recent diagnosis lymphoma (still in early stages of workup) who presented 9/25 with progressive back pain. In ER found to be tachycardic, hypotensive with lactate 14, AKI and coagulopathy. Found to have severe cardiomyopathy with EF 15%   PULMONARY ETT 9/26-9/30 (self extubated)>10/1  A: Acute hypoxic respiratory failure 2nd to HCAP with ARDS  Hx of tobacco abuse. 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 Shock - combination of sepsis and cardiac. Pressor dependent  RENAL Acute Renal Failure >> likely from CHF, sepsis, and diuresis  GASTROINTESTINAL Shock liver - improving. Upper GIB - secondary to erosive esophagitis on EGD. Protein calorie malnutrition. R/o ileus hyperbili (lymphoma ), cholestasis likely, shock liver component  Loose stools,  cdiff +  HEMATOLOGIC New dx of Hodgkin's lymphoma Coagulopathy >> resolved. Anemia of critical illness, and GI bleed. Thrombocytopenia.  INFECTIOUS Resolved hcap C diff colitis  ENDOCRINE Hyperglycemia.  NEUROLOGIC Delirium   Tenuous, long term poor prognosis.  Family is faced with advanced directive decisions    Primary Diagnoses  Present on Admission:  . Sepsis ([place])  Palliative Review of Systems:    - anxiety, weakness, abdominal pain   I have reviewed the medical record, interviewed the patient and family, and examined the patient. The following aspects are pertinent.  Past Medical  History  Diagnosis Date  . Alcohol abuse 08/2015    admits to 6 pack beer daily.   . Mass of neck 05/2015    multiple soft tissue masses, palpable and seen on CT.  present since at leat 05/2015.   Marland Kitchen Anemia 08/2015    Iron deficient, microcytic  .  Elevated LFTs 08/2015    acute hep ABC serologies negative.   Marland Kitchen Herpes zoster 03/2014    right C3-4, T1-2 distribution.   . Gynecomastia 08/2015    per PET scan  . Opacity of lung on imaging study 08/2015    bilateral, with effusion and mild ephysema per PET scan  . Diverticulosis of colon 08/2015    per PET scan  . Hepatomegaly 08/2015    and 1.6 cm liver cyst per PET.   Marland Kitchen Ascites 08/2015    small abd ascites and mild anasarca per PET  . CHF (congestive heart failure) 08/2015    Severe dilatation of all of the heart chambers. EF 10 to 15%.  Severe LV and moderate RV systolic dysfunction. Restrictive pattern of diastolic dysfunction. Moderate aortic, mitral, pulmonic and tricuspid regurgitation. Mild to moderate pulmonary hypertension  . AKI (acute kidney injury) 08/2015  . 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 Coffee ground emesis 09/13/2015    grade c erosive esophagitis and small HH on EGD  . Hodgkin lymphoma of lymph nodes of multiple sites 09/28/2015   Social History   Social History  . Marital Status: Married    Spouse Name: N/A  . Number of Children: N/A  . Years of Education: N/A   Social History Main Topics  . Smoking status: Current Every Day Smoker -- 1.00 packs/day    Types: Cigarettes  . Smokeless tobacco: Never Used  . Alcohol Use: 0.0 oz/week    0 Standard drinks or equivalent per week  . Drug Use: No  . Sexual Activity: Not Currently   Other Topics Concern  . 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 Lung cancer Father    Scheduled Meds: . acyclovir  400 mg Per Tube Daily  . antiseptic oral rinse  7 mL Mouth Rinse BID  . dronabinol  5 mg Oral BID AC  . feeding supplement  1 Container Oral TID BM  . fluconazole  100 mg Oral Daily  . hydrocortisone sodium succinate  50 mg Intravenous Q8H  . pantoprazole (PROTONIX) IV  40 mg Intravenous  Q12H   Continuous Infusions: . sodium chloride 10 mL/hr at 10/09/15 2000  . DOBUTamine 7.5 mcg/kg/min (10/10/15 2020)  . heparin 10,000 units/ 20 mL infusion syringe 2,500 Units/hr (10/11/15 0557)  . norepinephrine (LEVOPHED) Adult infusion 35 mcg/min (10/11/15 0800)  . phenylephrine (NEO-SYNEPHRINE) Adult infusion 30 mcg/min (10/10/15 2357)  . dialysis replacement fluid (prismasate) 800 mL/hr at 10/11/15 0411  . dialysis replacement fluid (prismasate) 700 mL/hr at 10/11/15 0345  . dialysate (PRISMASATE) 1,500 mL/hr at 10/11/15 0801   PRN Meds:.sodium chloride, albuterol, albuterol, alteplase, diphenhydrAMINE, diphenhydrAMINE, EPINEPHrine, EPINEPHrine, EPINEPHrine, EPINEPHrine, famotidine, fentaNYL (SUBLIMAZE) injection, heparin, heparin, heparin lock flush, heparin lock flush, ipratropium-albuterol, loperamide, methylPREDNISolone sodium succinate, ondansetron (ZOFRAN) IV, sodium chloride, sodium chloride, sodium chloride Medications Prior to Admission:  Prior to Admission medications   Medication Sig Start Date End Date Taking? Authorizing Provider  cyclobenzaprine (FLEXERIL) 10 MG tablet Take 1 tablet (10  mg total) by mouth 3 (three) times daily as needed for muscle spasms. 09/08/15  Yes Roselee Culver, MD  EPINEPHrine 0.3 mg/0.3 mL IJ SOAJ injection Inject 0.3 mLs (0.3 mg total) into the muscle once. 09/08/15  Yes Roselee Culver, MD  esomeprazole (NEXIUM) 20 MG capsule Take 20 mg by mouth daily at 12 noon.   Yes Historical Provider, MD  Multiple Vitamin (MULTIVITAMIN WITH MINERALS) TABS tablet Take 1 tablet by mouth daily.   Yes Historical Provider, MD  naproxen sodium (ANAPROX DS) 550 MG tablet Take 1 tablet (550 mg total) by mouth 2 (two) times daily with a meal. 09/08/15 09/07/16 Yes Roselee Culver, MD  predniSONE (DELTASONE) 20 MG tablet Take 3 PO QAM x3days, 2 PO QAM x3days, 1 PO QAM x3days 09/22/15  Yes Tereasa Coop, PA-C  traMADol (ULTRAM) 50 MG tablet Take 1 tablet (50 mg  total) by mouth every 8 (eight) hours as needed. Patient taking differently: Take 50 mg by mouth every 8 (eight) hours as needed for moderate pain.  09/08/15  Yes Roselee Culver, MD  dexamethasone (DECADRON) 4 MG tablet Take 2 tablets (8 mg total) by mouth 2 (two) times daily with a meal. Start the day after chemotherapy for 2 days. Take with food. 09/29/15   Volanda Napoleon, MD  lidocaine-prilocaine (EMLA) cream Apply to affected area once 09/29/15   Volanda Napoleon, MD  LORazepam (ATIVAN) 0.5 MG tablet Take 1 tablet (0.5 mg total) by mouth every 6 (six) hours as needed (Nausea or vomiting). 09/29/15   Volanda Napoleon, MD  ondansetron (ZOFRAN) 8 MG tablet Take 1 tablet (8 mg total) by mouth 2 (two) times daily. Start the day after chemo for 2 days. Then take as needed for nausea or vomiting. 09/29/15   Volanda Napoleon, MD  prochlorperazine (COMPAZINE) 10 MG tablet Take 1 tablet (10 mg total) by mouth every 6 (six) hours as needed (Nausea or vomiting). 09/29/15   Volanda Napoleon, MD   Allergies  Allergen Reactions  . Peanut-Containing Drug Products Anaphylaxis, Shortness Of Breath and Nausea And Vomiting   CBC:    Component Value Date/Time   WBC 4.6 10/11/2015 0440   WBC 13.3* 09/12/2015 0908   WBC 14.9* 09/08/2015 0931   HGB 8.8* 10/11/2015 0440   HGB 9.9* 09/12/2015 0908   HGB 10.1* 09/08/2015 0931   HCT 26.9* 10/11/2015 0440   HCT 30.6* 09/12/2015 0908   HCT 32.3* 09/08/2015 0931   PLT 86* 10/11/2015 0440   PLT 528* 09/12/2015 0908   MCV 73.7* 10/11/2015 0440   MCV 79* 09/12/2015 0908   MCV 78.0* 09/08/2015 0931   NEUTROABS 14.5* 10/04/2015 2255   NEUTROABS 10.7* 09/12/2015 0908   LYMPHSABS 0.7 10/04/2015 2255   LYMPHSABS 1.0 09/12/2015 0908   MONOABS 0.3 10/04/2015 2255   EOSABS 0.1 10/04/2015 2255   EOSABS 0.1 09/12/2015 0908   BASOSABS 0.0 10/04/2015 2255   BASOSABS 0.0 09/12/2015 0908   Comprehensive Metabolic Panel:    Component Value Date/Time   NA 134* 10/11/2015 0440    NA 134* 10/11/2015 0440   NA 136 09/12/2015 0909   K 4.0 10/11/2015 0440   K 4.0 10/11/2015 0440   K 4.6 09/12/2015 0909   CL 98* 10/11/2015 0440   CL 99* 10/11/2015 0440   CL 98 09/12/2015 0909   CO2 27 10/11/2015 0440   CO2 27 10/11/2015 0440   CO2 25 09/12/2015 0909   BUN 7 10/11/2015 0440  BUN 9 10/11/2015 0440   BUN 20 09/12/2015 0909   CREATININE 0.97 10/11/2015 0440   CREATININE 1.00 10/11/2015 0440   CREATININE 1.2 09/12/2015 0909   GLUCOSE 123* 10/11/2015 0440   GLUCOSE 121* 10/11/2015 0440   GLUCOSE 92 09/12/2015 0909   CALCIUM 7.2* 10/11/2015 0440   CALCIUM 7.1* 10/11/2015 0440   CALCIUM 9.8 09/12/2015 0909   AST 40 10/11/2015 0440   AST 230* 09/12/2015 0909   ALT 53 10/11/2015 0440   ALT 382* 09/12/2015 0909   ALKPHOS 177* 10/11/2015 0440   ALKPHOS 503* 09/12/2015 0909   BILITOT 8.5* 10/11/2015 0440   BILITOT 1.10 09/12/2015 0909   PROT 4.7* 10/11/2015 0440   PROT 7.3 09/12/2015 0909   ALBUMIN 1.6* 10/11/2015 0440   ALBUMIN 1.6* 10/11/2015 0440   ALBUMIN 2.8* 09/12/2015 0909    Physical Exam:  Vital Signs: BP 83/58 mmHg  Pulse 107  Temp(Src) 97.5 F (36.4 C) (Oral)  Resp 18  Ht 5' 11.5" (1.816 m)  Wt 70.9 kg (156 lb 4.9 oz)  BMI 21.50 kg/m2  SpO2 99% SpO2: SpO2: 99 % O2 Device: O2 Device: Not Delivered O2 Flow Rate: O2 Flow Rate (L/min): 2 L/min Intake/output summary:  Intake/Output Summary (Last 24 hours) at 10/11/15 0815 Last data filed at 10/11/15 0800  Gross per 24 hour  Intake 1065.5 ml  Output   2091 ml  Net -1025.5 ml   LBM: Last BM Date: 10/10/15 Baseline Weight: Weight: 88.7 kg (195 lb 8.8 oz) Most recent weight: Weight: 70.9 kg (156 lb 4.9 oz)  Exam Findings:   General: ill appearing NAD, lethargic but currently oriented, involved in discussion with myself and his wife HEENT: moist buccal membranes CVS: tachycardic Resp: CTA Abd: soft NT Skin: warm and dry Neuro: intermittently confused,           Additional Data  Reviewed: Recent Labs     10/10/15  0444  10/10/15  1600  10/11/15  0440  WBC  2.7*   --   4.6  HGB  8.3*   --   8.8*  PLT  111*   --   86*  NA  134*  136  134*  134*  BUN  14  11  9  7   CREATININE  1.03  1.02  1.00  0.97     Time In: 0930 Time Out: 1100 Time Total: 90 min  Greater than 50%  of this time was spent counseling and coordinating care related to the above assessment and plan.  Discussed with bedside RN  Signed by: Wadie Lessen, NP  Knox Royalty, NP  10/11/2015, 8:15 AM  Please contact Palliative Medicine Team phone at [phone] for questions and concerns.   See AMION for contact information

## 2015-10-11 NOTE — Progress Notes (Signed)
PULMONARY / CRITICAL CARE MEDICINE   Name: Paul Morton MRN: [id number] DOB: 26-Mar-1961    ADMISSION DATE:  08/31/2015  REFERRING MD :  EDP  CHIEF COMPLAINT:  Sepsis   INITIAL PRESENTATION:  54yo male smoker with recent diagnosis lymphoma (still in early stages of workup) who presented 9/25 with progressive back pain.  In ER found to be tachycardic, hypotensive with lactate 14, AKI and coagulopathy.  PCCM called to admit.  Found to have severe cardiomyopathy with EF 15%   STUDIES:  PET 9/22 - Bulky confluent hypermetabolic left cervical lymphadenopathy involving levels II-V. Given the presence of hepatomegaly with diffuse liver hypermetabolism, a diagnosis of lymphoma is favored, however metastatic carcinoma remains on the differential.  Hypermetabolic patchy ground-glass opacity and consolidation in both upper lobes and right lower lobe, favor an infectious or inflammatory etiology.  TTE 9/26 - EF 10-15% w/o regional wall motion abnormality. Grade 3 diastolic dysfunction. Moderate AR. Moderate MR. RV severely dilated w/ moderate reduction in systolic function. PASP 43 mmHg. No pericardial effusion.  RLE Venous Duplex 9/26 - no DVT or SVT  MRI spine 9/25 - incomplete exam, No evidence of significant spinal stenosis or cord compression in the cervical or visualized thoracic spine.   MRI L-spine 9/27 - suboptimal exam. No malignant or suspicious signal.  Echo repeat 10/8- no changes  SIGNIFICANT EVENTS: 9/25 - Admitted to hospital 9/25 - Right IJ CVC by ED 9/26 - Intubated for pending respiratory failure & transferred to [facility] 9/27 - EGD w/ erosive esophagitis 9/27 - FNA neck mass 9/30 - Self extubation during SBT 10/1 - Reintubated  10/4 cvvhd started 10/5- hd cathter out, new placed, cvvhd restarted 10/6- extubated 10/7 - not  Responding to dobutamine 10/11 hypotensive - neo added  SUBJECTIVE:  Hypotensive on Dobutamine  , levophed at max dose, neo added C/o abd pain  -loose stools + Remains on CVVH   VITAL SIGNS: Temp:  [96 F (35.6 C)-97.5 F (36.4 C)] 97.4 F (36.3 C) (10/12 0818) Pulse Rate:  [91-114] 109 (10/12 1000) Resp:  [11-30] 14 (10/12 1000) BP: (64-102)/(38-81) 85/62 mmHg (10/12 1000) SpO2:  [94 %-100 %] 99 % (10/12 1000) Weight:  [70.9 kg (156 lb 4.9 oz)] 70.9 kg (156 lb 4.9 oz) (10/12 0434) HEMODYNAMICS: CVP:  [10 mmHg-11 mmHg] 10 mmHg VENTILATOR SETTINGS:   INTAKE / OUTPUT:  Intake/Output Summary (Last 24 hours) at 10/11/15 1103 Last data filed at 10/11/15 1000  Gross per 24 hour  Intake 1102.5 ml  Output   2033 ml  Net -930.5 ml    PHYSICAL EXAMINATION: General: chr ill, on CVVH Neck- no stridor, no jvd HEENT: , lymphad  decreased Cardiovascular:  s1 s2 rrt no m Pulmonary: decreased BL Abdomen: Soft, non tender Neurological: A O x 3  LABS:  CBC  Recent Labs Lab 10/09/15 0420 10/10/15 0444 10/11/15 0440  WBC 3.1* 2.7* 4.6  HGB 8.2* 8.3* 8.8*  HCT 26.5* 25.9* 26.9*  PLT 157 111* 86*   Coag's  Recent Labs Lab 10/05/15 1339  10/09/15 0420 10/10/15 0444 10/11/15 0440  APTT 44*  < > 111* 132* 122*  INR 1.24  --   --   --   --   < > = values in this interval not displayed. BMET  Recent Labs Lab 10/10/15 0444 10/10/15 1600 10/11/15 0440  NA 134* 136 134*  134*  K 4.6 4.2 4.0  4.0  CL 99* 101 99*  98*  CO2 26 25 27  27  BUN 14 11 9  7   CREATININE 1.03 1.02 1.00  0.97  GLUCOSE 110* 136* 121*  123*   Electrolytes  Recent Labs Lab 10/09/15 0420  10/10/15 0444 10/10/15 1600 10/11/15 0440  CALCIUM 7.9*  < > 7.3* 7.6* 7.1*  7.2*  MG 2.4  --  2.5*  --  2.3  PHOS 2.8  < > 2.3* 2.1* 2.0*  < > = values in this interval not displayed.   Sepsis Markers  Recent Labs Lab 10/07/15 1204  LATICACIDVEN 1.12   ABG  Recent Labs Lab 10/10/15 2243  PHART 7.519*  PCO2ART 30.6*  PO2ART 79.0*   Liver Enzymes  Recent Labs Lab 10/09/15 0420  10/10/15 0444 10/10/15 1600  10/11/15 0440  AST 58*  --  44*  --  40  ALT 61  --  56  --  53  ALKPHOS 213*  --  182*  --  177*  BILITOT 8.6*  8.8*  --  8.0*  --  8.5*  ALBUMIN 1.8*  < > 1.7*  1.7* 1.6* 1.6*  1.6*  < > = values in this interval not displayed. Cardiac Enzymes No results for input(s): TROPONINI, PROBNP in the last 168 hours. Glucose  Recent Labs Lab 10/08/15 1956 10/09/15 10/09/15 0815 10/09/15 2343 10/10/15 0421 10/11/15 0812  GLUCAP 104* 112* 102* 114* 102* 118*    Imaging Dg Abd Portable 1v  10/10/2015  CLINICAL DATA:  Severe abdominal pain with nausea EXAM: PORTABLE ABDOMEN - 1 VIEW COMPARISON:  10/09/2015 FINDINGS: The bowel gas pattern is normal. No radio-opaque calculi or other significant radiographic abnormality are seen. IMPRESSION: No bowel obstruction. Electronically Signed   By: Kathreen Devoid   On: 10/10/2015 16:48    ASSESSMENT / PLAN:  PULMONARY ETT 9/26>>>9/30 (self extubated)>10/1  A: Acute hypoxic respiratory failure 2nd to HCAP with ARDS >> less likely pulmonary edema -resolved Hx of tobacco abuse. 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 Shock - combination of sepsis and cardiac. R/o  Relative adrenal insuff -cortisol dropped so significantly and on pressors weeks  Poor response dobutmaine P:  MAp 60 or SBP 85 ok Levo titrate down if able Neo gtt, add vaso Ct stress steroids empiric  RENAL A: Acute Renal Failure >> likely from CHF, sepsis, and diuresis P:   cvvhd    GASTROINTESTINAL A: Shock liver - improving. Upper GIB - secondary to erosive esophagitis on EGD. Protein calorie malnutrition. R/o ileus hyperbili (lymphoma ), cholestasis likely, shock liver component  Loose stools, prior cdiff neg P:   ppi renal diet flexi seal, imodium  Consider TNA if unable to tolerate PO - as last  resort  HEMATOLOGIC A: New dx of Hodgkin's lymphoma Coagulopathy >> resolved. Anemia of critical illness, and GI bleed. Thrombocytopenia. P:  SCDs Chemotherapy per onc -hold off on CT neck Limit phlebotomy  INFECTIOUS A: Resolved hcap C diff colitis P:   Vancomycin 9/25>>>off Zosyn 9/25>>>full course completed Zovirax for prophylaxis  Sputum 10/01 >>candida   PO vanc 10/12 >> Flagyl 10/12 >>  ENDOCRINE A: Hyperglycemia. Rel AI P:   SSI if CBG > 180 Empiric steroids -start taper  NEUROLOGIC A: Delirium likely P:   PT if improves  Family - Palliative care following - ongoing discussion about care limitations, wife Sharee Pimple is optimistic  Summary - persistent shock ?  Cardiogenic rather than septic - not a candidate for LVAD Cholestasis ? Related to shock liver vs cardiac congestion   The patient is critically ill with multiple organ systems failure and requires high complexity decision making for assessment and support, frequent evaluation and titration of therapies, application of advanced monitoring technologies and extensive interpretation of multiple databases. Critical Care Time devoted to patient care services described in this note independent of APP time is 35 minutes.   Kara Mead MD. Shade Flood. [facility] Pager [phone] If no response call [phone]

## 2015-10-12 DIAGNOSIS — R570 Cardiogenic shock: Secondary | ICD-10-CM

## 2015-10-12 DIAGNOSIS — Z515 Encounter for palliative care: Secondary | ICD-10-CM

## 2015-10-12 DIAGNOSIS — Z7189 Other specified counseling: Secondary | ICD-10-CM

## 2015-10-12 LAB — HEPATIC FUNCTION PANEL
ALBUMIN: 1.4 g/dL — AB (ref 3.5–5.0)
ALK PHOS: 144 U/L — AB (ref 38–126)
ALT: 50 U/L (ref 17–63)
AST: 53 U/L — AB (ref 15–41)
Bilirubin, Direct: 4.7 mg/dL — ABNORMAL HIGH (ref 0.1–0.5)
Indirect Bilirubin: 4.4 mg/dL — ABNORMAL HIGH (ref 0.3–0.9)
TOTAL PROTEIN: 4.6 g/dL — AB (ref 6.5–8.1)
Total Bilirubin: 9.1 mg/dL — ABNORMAL HIGH (ref 0.3–1.2)

## 2015-10-12 LAB — RENAL FUNCTION PANEL
ALBUMIN: 1.5 g/dL — AB (ref 3.5–5.0)
ANION GAP: 7 (ref 5–15)
Albumin: 1.5 g/dL — ABNORMAL LOW (ref 3.5–5.0)
Anion gap: 7 (ref 5–15)
BUN: 13 mg/dL (ref 6–20)
BUN: 15 mg/dL (ref 6–20)
CALCIUM: 7.1 mg/dL — AB (ref 8.9–10.3)
CHLORIDE: 100 mmol/L — AB (ref 101–111)
CO2: 27 mmol/L (ref 22–32)
CO2: 26 mmol/L (ref 22–32)
CREATININE: 1.06 mg/dL (ref 0.61–1.24)
Calcium: 7 mg/dL — ABNORMAL LOW (ref 8.9–10.3)
Chloride: 103 mmol/L (ref 101–111)
Creatinine, Ser: 0.94 mg/dL (ref 0.61–1.24)
GFR calc Af Amer: >60 mL/min (ref >60)
GFR calc non Af Amer: >60 mL/min (ref >60)
GLUCOSE: 137 mg/dL — AB (ref 65–99)
Glucose, Bld: 162 mg/dL — ABNORMAL HIGH (ref 65–99)
PHOSPHORUS: 2.1 mg/dL — AB (ref 2.5–4.6)
POTASSIUM: 4.3 mmol/L (ref 3.5–5.1)
Phosphorus: 2.1 mg/dL — ABNORMAL LOW (ref 2.5–4.6)
Potassium: 4 mmol/L (ref 3.5–5.1)
SODIUM: 134 mmol/L — AB (ref 135–145)
SODIUM: 136 mmol/L (ref 135–145)

## 2015-10-12 LAB — CBC
HEMATOCRIT: 23.6 % — AB (ref 39.0–52.0)
HEMOGLOBIN: 7.5 g/dL — AB (ref 13.0–17.0)
MCH: 23.5 pg — AB (ref 26.0–34.0)
MCHC: 31.8 g/dL (ref 30.0–36.0)
MCV: 74 fL — ABNORMAL LOW (ref 78.0–100.0)
Platelets: 42 10*3/uL — ABNORMAL LOW (ref 150–400)
RBC: 3.19 MIL/uL — AB (ref 4.22–5.81)
RDW: 20.7 % — ABNORMAL HIGH (ref 11.5–15.5)
WBC: 6.3 10*3/uL (ref 4.0–10.5)

## 2015-10-12 LAB — APTT
aPTT: >200 seconds (ref 24–37)
aPTT: >200 seconds (ref 24–37)

## 2015-10-12 LAB — GLUCOSE, CAPILLARY
GLUCOSE-CAPILLARY: 124 mg/dL — AB (ref 65–99)
GLUCOSE-CAPILLARY: 138 mg/dL — AB (ref 65–99)
Glucose-Capillary: 121 mg/dL — ABNORMAL HIGH (ref 65–99)
Glucose-Capillary: 144 mg/dL — ABNORMAL HIGH (ref 65–99)
Glucose-Capillary: 149 mg/dL — ABNORMAL HIGH (ref 65–99)
Glucose-Capillary: 148 mg/dL — ABNORMAL HIGH (ref 65–99)

## 2015-10-12 LAB — MAGNESIUM: Magnesium: 2.4 mg/dL (ref 1.7–2.4)

## 2015-10-12 LAB — CARBOXYHEMOGLOBIN
Carboxyhemoglobin: 1.7 % — ABNORMAL HIGH (ref 0.5–1.5)
METHEMOGLOBIN: 1 % (ref 0.0–1.5)
O2 Saturation: 54.4 %
TOTAL HEMOGLOBIN: 7.6 g/dL — AB (ref 13.5–18.0)

## 2015-10-12 LAB — POCT ACTIVATED CLOTTING TIME
ACTIVATED CLOTTING TIME: 214 s
Activated Clotting Time: 220 seconds
Activated Clotting Time: 214 seconds

## 2015-10-12 MED ORDER — HYDROCORTISONE NA SUCCINATE PF 100 MG IJ SOLR
50.0000 mg | Freq: Two times a day (BID) | INTRAMUSCULAR | Status: DC
  Administered 2015-10-12 – 2015-10-13 (×2): 50 mg via INTRAVENOUS
  Filled 2015-10-12 (×2): qty 1

## 2015-10-12 NOTE — Progress Notes (Signed)
Daily Progress Note   Patient Name: Paul Morton       Date: 10/12/2015 DOB: 12-Dec-1961  Age: 54 y.o. MRN#: [id number] Attending Physician: Raylene Miyamoto, MD Primary Care Physician: No PCP Per Patient Admit Date: 09/11/2015  Reason for Consultation/Follow-up: Establishing goals of care and Psychosocial/spiritual support  Subjective:     -again sat at bedside with patient and his wife, empathetic listening promoting both patient and his wife to process and discuss wishes and desires in this very difficult situation, with very poor prognosis, with very limited options.  Paul Morton and Paul Morton verbalized love for each other, concerns regarding family.  Patient himself is able to express his knowing "this is not good", no changes in care plan  1730-returned to bedside and again sat with Paul Morton and Paul Morton.  They tell me they have had lots of conversation with various providers throughout the day regarding "reaching the end", "not much more to do".  Dr Mahalia Longest has established DNR   Paul Morton tells me when "he is not alert anymore we will stop everything"  Courageously both are coming to terms with the sad realization and acceptance of Paul Morton's impending death   Length of Stay: 18 days  Current Medications: Scheduled Meds:  . acyclovir  400 mg Per Tube Daily  . antiseptic oral rinse  7 mL Mouth Rinse BID  . dronabinol  5 mg Oral BID AC  . feeding supplement  1 Container Oral TID BM  . fluconazole  100 mg Oral Daily  . hydrocortisone sodium succinate  50 mg Intravenous Q12H  . metronidazole  500 mg Intravenous Q8H  . pantoprazole (PROTONIX) IV  40 mg Intravenous Q12H  . vancomycin  500 mg Oral 4 times per day    Continuous Infusions: . DOBUTamine 7.5 mcg/kg/min (10/12/15 0118)  . norepinephrine (LEVOPHED) Adult infusion 50 mcg/min (10/12/15 1600)  . phenylephrine (NEO-SYNEPHRINE) Adult infusion 30 mcg/min (10/11/15 2117)  . dialysis replacement fluid (prismasate) 800 mL/hr at 10/12/15 1213    . dialysis replacement fluid (prismasate) 700 mL/hr at 10/12/15 0956  . dialysate (PRISMASATE) 1,500 mL/hr at 10/12/15 1552  . vasopressin (PITRESSIN) infusion - *FOR SHOCK* 0.03 Units/min (10/12/15 0738)    PRN Meds: albuterol, albuterol, alteplase, fentaNYL (SUBLIMAZE) injection, heparin lock flush, heparin lock flush, ipratropium-albuterol, ondansetron (ZOFRAN) IV, sodium chloride, sodium chloride, sodium chloride    Vital Signs: BP 81/55 mmHg  Pulse 107  Temp(Src) 97.4 F (36.3 C) (Oral)  Resp 22  Ht 5' 11.5" (1.816 m)  Wt 70.1 kg (154 lb 8.7 oz)  BMI 21.26 kg/m2  SpO2 99% SpO2: SpO2: 99 % O2 Device: O2 Device: Not Delivered O2 Flow Rate: O2 Flow Rate (L/min): 2 L/min  Intake/output summary:  Intake/Output Summary (Last 24 hours) at 10/12/15 1703 Last data filed at 10/12/15 1600  Gross per 24 hour  Intake 2268.8 ml  Output   1741 ml  Net  527.8 ml   LBM: Last BM Date: 10/12/15 Baseline Weight: Weight: 88.7 kg (195 lb 8.8 oz) Most recent weight: Weight: 70.1 kg (154 lb 8.7 oz)  Physical Exam:  General: ill appearing NAD, lethargic but currently oriented, appears weaker today, involved in discussion with myself and his wife HEENT: moist buccal membranes CVS: tachycardic Resp: CTA Abd: soft NT Skin: warm and dry           Additional Data Reviewed: Recent Labs     10/11/15  0440   10/12/15  0338  10/12/15  1224  10/12/15  1610  WBC  4.6   --    --   6.3   --   HGB  8.8*   --    --   7.5*   --   PLT  86*   --    --   42*   --   NA  134*  134*   < >  134*   --   136  BUN  9  7   < >  13   --   15  CREATININE  1.00  0.97   < >  0.94   --   1.06   < > = values in this interval not displayed.     Problem List:  Patient Active Problem List   Diagnosis Date Noted  . DNR (do not resuscitate) discussion 10/12/2015  . Palliative care encounter 10/12/2015  . C. difficile colitis   . Acute renal failure (ARF) ([place])   . ARDS (adult respiratory distress  syndrome) ([place])   . HCAP (healthcare-associated pneumonia)   . AKI (acute kidney injury) ([place])   . Hodgkin lymphoma of extranodal site excluding spleen and other solid organs ([place])   . Respiratory failure ([place])   . Hodgkin lymphoma of lymph nodes of multiple sites ([place]) 09/28/2015  . Acute renal failure with acute cortical necrosis ([facility])   . Cardiogenic shock ([place])   . Septic shock ([place])   . Acute renal failure ([place]) 09/25/2015  . Lactic acid acidosis 09/25/2015  . Cervical mass 09/25/2015  . Severe low back pain 09/25/2015  . Shock liver 09/25/2015  . Acute systolic congestive heart failure, NYHA class 4 ([place]) 09/25/2015  . Lymphadenopathy   . Acute respiratory failure ([place])   . Hematemesis without nausea   . Coagulation disorder ([place])   . Elevated LFTs   . Sepsis ([place]) 09/22/2015     Palliative Care Assessment & Plan    Code Status:   DNR/DNI- per EMR  Goals of Care:  Patient and wife are both verbalizing an understanding of  the limited prognosis, and hope to have "alittle more time together".   DNR has been established at this time continue current treatments as long as patient is alert and oriented.  Comfort is a priority.  I explained to patient and his wife I would not be at the hospital tomorrow however a member of our team can be reached as needed at [phone]    Thank you for allowing the Palliative Medicine Team to assist in the care of this patient.   Time In: 1725 Time Out: 1800 Total Time 35 min Prolonged Time Billed  no    Greater than 50%  of this time was spent counseling and coordinating care related to the above assessment and plan.     Knox Royalty, NP  10/12/2015, 5:03 PM  Please contact Palliative Medicine Team phone at [phone] for questions and concerns.

## 2015-10-12 NOTE — Progress Notes (Signed)
PULMONARY / CRITICAL CARE MEDICINE   Name: Paul Morton MRN: [id number] DOB: 04/13/1961    ADMISSION DATE:  09/13/2015  REFERRING MD :  EDP  CHIEF COMPLAINT:  Sepsis   INITIAL PRESENTATION:  54yo male smoker with recent diagnosis lymphoma (still in early stages of workup) who presented 9/25 with progressive back pain.  In ER found to be tachycardic, hypotensive with lactate 14, AKI and coagulopathy.  PCCM called to admit.  Found to have severe cardiomyopathy with EF 15%   STUDIES:  PET 9/22 - Bulky confluent hypermetabolic left cervical lymphadenopathy involving levels II-V. Given the presence of hepatomegaly with diffuse liver hypermetabolism, a diagnosis of lymphoma is favored, however metastatic carcinoma remains on the differential.  Hypermetabolic patchy ground-glass opacity and consolidation in both upper lobes and right lower lobe, favor an infectious or inflammatory etiology.  TTE 9/26 - EF 10-15% w/o regional wall motion abnormality. Grade 3 diastolic dysfunction. Moderate AR. Moderate MR. RV severely dilated w/ moderate reduction in systolic function. PASP 43 mmHg. No pericardial effusion.  RLE Venous Duplex 9/26 - no DVT or SVT  MRI spine 9/25 - incomplete exam, No evidence of significant spinal stenosis or cord compression in the cervical or visualized thoracic spine.   MRI L-spine 9/27 - suboptimal exam. No malignant or suspicious signal.  Echo repeat 10/8- no changes  SIGNIFICANT EVENTS: 9/25 - Admitted to hospital 9/25 - Right IJ CVC by ED 9/26 - Intubated for pending respiratory failure & transferred to [facility] 9/27 - EGD w/ erosive esophagitis 9/27 - FNA neck mass 9/30 - Self extubation during SBT 10/1 - Reintubated  10/4 cvvhd started 10/5- hd cathter out, new placed, cvvhd restarted 10/6- extubated 10/7 - not  Responding to dobutamine 10/11 hypotensive - neo added  SUBJECTIVE:  Remains Hypotensive on Dobutamine  , levophed at max dose, neo added No  abd  pain - flexiseal with loose stools, dark Remains on CVVH   VITAL SIGNS: Temp:  [97 F (36.1 C)-97.6 F (36.4 C)] 97 F (36.1 C) (10/13 1155) Pulse Rate:  [92-115] 99 (10/13 1115) Resp:  [12-30] 18 (10/13 1115) BP: (74-99)/(33-80) 77/42 mmHg (10/13 1115) SpO2:  [95 %-100 %] 100 % (10/13 1100) Weight:  [70.1 kg (154 lb 8.7 oz)] 70.1 kg (154 lb 8.7 oz) (10/13 0432) HEMODYNAMICS: CVP:  [4 mmHg-5 mmHg] 5 mmHg VENTILATOR SETTINGS:   INTAKE / OUTPUT:  Intake/Output Summary (Last 24 hours) at 10/12/15 1203 Last data filed at 10/12/15 1100  Gross per 24 hour  Intake 2023.2 ml  Output   1706 ml  Net  317.2 ml    PHYSICAL EXAMINATION: General: chr ill, on CVVH Neck- no stridor, no jvd HEENT: , lymphad  decreased Cardiovascular:  s1 s2 rrt no m Pulmonary: decreased BL Abdomen: Soft, non tender Neurological: A O x 3  LABS:  CBC  Recent Labs Lab 10/09/15 0420 10/10/15 0444 10/11/15 0440  WBC 3.1* 2.7* 4.6  HGB 8.2* 8.3* 8.8*  HCT 26.5* 25.9* 26.9*  PLT 157 111* 86*   Coag's  Recent Labs Lab 10/05/15 1339  10/11/15 0440 10/12/15 0331 10/12/15 0550  APTT 44*  < > 122* >200* >200*  INR 1.24  --   --   --   --   < > = values in this interval not displayed. BMET  Recent Labs Lab 10/11/15 0440 10/11/15 1650 10/12/15 0338  NA 134*  134* 134* 134*  K 4.0  4.0 4.3 4.3  CL 99*  98* 99* 100*  CO2 27  27 26 27   BUN 9  7 10 13   CREATININE 1.00  0.97 0.92 0.94  GLUCOSE 121*  123* 135* 137*   Electrolytes  Recent Labs Lab 10/10/15 0444  10/11/15 0440 10/11/15 1650 10/12/15 0331 10/12/15 0338  CALCIUM 7.3*  < > 7.1*  7.2* 7.0*  --  7.0*  MG 2.5*  --  2.3  --  2.4  --   PHOS 2.3*  < > 2.0* 2.0*  --  2.1*  < > = values in this interval not displayed.   Sepsis Markers  Recent Labs Lab 10/07/15 1204  LATICACIDVEN 1.12   ABG  Recent Labs Lab 10/10/15 2243  PHART 7.519*  PCO2ART 30.6*  PO2ART 79.0*   Liver Enzymes  Recent Labs Lab  10/10/15 0444  10/11/15 0440 10/11/15 1650 10/12/15 0331 10/12/15 0338  AST 44*  --  40  --  53*  --   ALT 56  --  53  --  50  --   ALKPHOS 182*  --  177*  --  144*  --   BILITOT 8.0*  --  8.5*  --  9.1*  --   ALBUMIN 1.7*  1.7*  < > 1.6*  1.6* 1.5* 1.4* 1.5*  < > = values in this interval not displayed. Cardiac Enzymes No results for input(s): TROPONINI, PROBNP in the last 168 hours. Glucose  Recent Labs Lab 10/11/15 0812 10/11/15 1153 10/11/15 1931 10/11/15 2354 10/12/15 0334 10/12/15 0755  GLUCAP 118* 125* 132* 124* 121* 144*    Imaging No results found.  ASSESSMENT / PLAN:  PULMONARY ETT 9/26>>>9/30 (self extubated)>10/1  A: Acute hypoxic respiratory failure 2nd to HCAP with ARDS >> less likely pulmonary edema -resolved Hx of tobacco abuse. 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 Shock - combination of sepsis and cardiac. R/o  Relative adrenal insuff -cortisol dropped so significantly and on pressors weeks  Poor response dobutmaine P:  MAp 60 or SBP 85 ok Levo titrate down if able Neo gtt, add vaso Ct stress steroids empiric  RENAL A: Acute Renal Failure >> likely from CHF, sepsis, and diuresis P:   cvvhd    GASTROINTESTINAL A: Shock liver - improving. Upper GIB - secondary to erosive esophagitis on EGD. Protein calorie malnutrition. R/o ileus hyperbili (lymphoma ), cholestasis likely, shock liver component  Loose stools, prior cdiff neg P:   ppi renal diet flexi seal, imodium    HEMATOLOGIC A: New dx of Hodgkin's lymphoma Coagulopathy >> resolved. Anemia of critical illness, and GI bleed. Thrombocytopenia. P:  SCDs Chemotherapy per onc -hold off on CT neck Limit phlebotomy  INFECTIOUS A: Resolved hcap C diff colitis P:   Vancomycin 9/25>>>off Zosyn 9/25>>>full course completed Zovirax  for prophylaxis  Sputum 10/01 >>candida   PO vanc 10/12 >> Flagyl 10/12 >> Anticipate 14 ds of c diff therapy  ENDOCRINE A: Hyperglycemia. Rel AI P:   SSI if CBG > 180 Empiric steroids -start taper  NEUROLOGIC A: Delirium likely P:   PT if improves  Family - Palliative care following - ongoing discussion about care limitations, wife Sharee Pimple is optimistic, he would like to keep all options open  Summary - persistent shock - Cardiogenic rather than septic - not a candidate for LVAD Cholestasis ? Related to shock liver vs cardiac congestion  The patient is critically ill with multiple organ systems failure and requires high complexity decision making for assessment and support, frequent evaluation and titration of therapies, application of advanced monitoring technologies and extensive interpretation of multiple databases. Critical Care Time devoted to patient care services described in this note independent of APP time is 31 minutes.   Kara Mead MD. Shade Flood. [place] Pager [phone] If no response call [phone]

## 2015-10-12 NOTE — Progress Notes (Signed)
Advanced Heart Failure Team Rounding Note  Subjective CO-OX 54% despite dobutamine 7.5  + full dose norepi 50 mcg+ vasopressin 0.03 units + neo 30 mcg .   Able to eat breakfast. Denies. SOB   Repeat echo 10/5. LVEF 10-15%. RV ok. No effusion .    Objective:    Vital Signs:   Temp:  [97 F (36.1 C)-97.6 F (36.4 C)] 97 F (36.1 C) (10/13 1155) Pulse Rate:  [92-109] 103 (10/13 1300) Resp:  [12-25] 15 (10/13 1300) BP: (74-99)/(33-80) 87/58 mmHg (10/13 1300) SpO2:  [95 %-100 %] 100 % (10/13 1300) Weight:  [154 lb 8.7 oz (70.1 kg)] 154 lb 8.7 oz (70.1 kg) (10/13 0432) Last BM Date: 10/12/15  Weight change: Filed Weights   10/10/15 0400 10/11/15 0434 10/12/15 0432  Weight: 157 lb 13.6 oz (71.6 kg) 156 lb 4.9 oz (70.9 kg) 154 lb 8.7 oz (70.1 kg)    Intake/Output:   Intake/Output Summary (Last 24 hours) at 10/12/15 1515 Last data filed at 10/12/15 1300  Gross per 24 hour  Intake 2194.2 ml  Output   1646 ml  Net  548.2 ml     Physical Exam: CVP 5 General: lying in bed. Weak appearing. Temporal wasting HEENT: normal x for scleral icterus Neck: supple. RIJ TLC . LIJ trialysis cath  Cor: PMI laterally displaced. regular + s3 Lungs: clear anteriorly   Abdomen: soft, nontender, nondistended. No bruits or masses. Good bowel sounds. Extremities: no cyanosis, clubbing, rash, no edema.  Neuro: Awake following commands.   Telemetry: sinus tach. 100-110.   Labs: Basic Metabolic Panel:  Recent Labs Lab 10/08/15 0409  10/09/15 0420  10/10/15 0444 10/10/15 1600 10/11/15 0440 10/11/15 1650 10/12/15 0331 10/12/15 0338  NA 137  < > 138  < > 134* 136 134*  134* 134*  --  134*  K 4.4  < > 4.7  < > 4.6 4.2 4.0  4.0 4.3  --  4.3  CL 99*  < > 101  < > 99* 101 99*  98* 99*  --  100*  CO2 27  < > 26  < > 26 25 27  27 26   --  27  GLUCOSE 96  < > 108*  < > 110* 136* 121*  123* 135*  --  137*  BUN 15  < > 14  < > 14 11 9  7 10   --  13  CREATININE 1.02  < > 1.02  < > 1.03  1.02 1.00  0.97 0.92  --  0.94  CALCIUM 8.0*  < > 7.9*  < > 7.3* 7.6* 7.1*  7.2* 7.0*  --  7.0*  MG 2.5*  --  2.4  --  2.5*  --  2.3  --  2.4  --   PHOS 2.4*  < > 2.8  < > 2.3* 2.1* 2.0* 2.0*  --  2.1*  < > = values in this interval not displayed.  Liver Function Tests:  Recent Labs Lab 10/08/15 0409  10/09/15 0420  10/10/15 0444 10/10/15 1600 10/11/15 0440 10/11/15 1650 10/12/15 0331 10/12/15 0338  AST 57*  --  58*  --  44*  --  40  --  53*  --   ALT 61  --  61  --  56  --  53  --  50  --   ALKPHOS 198*  --  213*  --  182*  --  177*  --  144*  --  BILITOT 8.6*  --  8.6*  8.8*  --  8.0*  --  8.5*  --  9.1*  --   PROT 5.6*  --  5.1*  --  5.1*  --  4.7*  --  4.6*  --   ALBUMIN 1.8*  1.8*  < > 1.8*  < > 1.7*  1.7* 1.6* 1.6*  1.6* 1.5* 1.4* 1.5*  < > = values in this interval not displayed. No results for input(s): LIPASE, AMYLASE in the last 168 hours. No results for input(s): AMMONIA in the last 168 hours.  CBC:  Recent Labs Lab 10/07/15 0402 10/08/15 1514 10/09/15 0420 10/10/15 0444 10/11/15 0440 10/12/15 1224  WBC 14.2*  --  3.1* 2.7* 4.6 6.3  HGB 8.2* 8.2* 8.2* 8.3* 8.8* 7.5*  HCT 25.4* 26.5* 26.5* 25.9* 26.9* 23.6*  MCV 74.7*  --  75.5* 75.7* 73.7* 74.0*  PLT 136*  --  157 111* 86* 42*    Cardiac Enzymes: No results for input(s): CKTOTAL, CKMB, CKMBINDEX, TROPONINI in the last 168 hours.  BNP: BNP (last 3 results) No results for input(s): BNP in the last 8760 hours.  ProBNP (last 3 results) No results for input(s): PROBNP in the last 8760 hours.   CBG:  Recent Labs Lab 10/11/15 1931 10/11/15 2354 10/12/15 0334 10/12/15 0755 10/12/15 1149  GLUCAP 132* 124* 121* 144* 149*    Coagulation Studies: No results for input(s): LABPROT, INR in the last 72 hours.   Imaging: Dg Abd Portable 1v  10/10/2015  CLINICAL DATA:  Severe abdominal pain with nausea EXAM: PORTABLE ABDOMEN - 1 VIEW COMPARISON:  10/09/2015 FINDINGS: The bowel gas pattern is  normal. No radio-opaque calculi or other significant radiographic abnormality are seen. IMPRESSION: No bowel obstruction. Electronically Signed   By: Kathreen Devoid   On: 10/10/2015 16:48     Medications:     Current Medications: . acyclovir  400 mg Per Tube Daily  . antiseptic oral rinse  7 mL Mouth Rinse BID  . dronabinol  5 mg Oral BID AC  . feeding supplement  1 Container Oral TID BM  . fluconazole  100 mg Oral Daily  . hydrocortisone sodium succinate  50 mg Intravenous Q12H  . metronidazole  500 mg Intravenous Q8H  . pantoprazole (PROTONIX) IV  40 mg Intravenous Q12H  . vancomycin  500 mg Oral 4 times per day    Infusions: . DOBUTamine 7.5 mcg/kg/min (10/12/15 0118)  . norepinephrine (LEVOPHED) Adult infusion 50 mcg/min (10/12/15 1300)  . phenylephrine (NEO-SYNEPHRINE) Adult infusion 30 mcg/min (10/11/15 2117)  . dialysis replacement fluid (prismasate) 800 mL/hr at 10/12/15 1213  . dialysis replacement fluid (prismasate) 700 mL/hr at 10/12/15 0956  . dialysate (PRISMASATE) 1,500 mL/hr at 10/12/15 1206  . vasopressin (PITRESSIN) infusion - *FOR SHOCK* 0.03 Units/min (10/12/15 9242)     Assessment:   1. Cardiogenic shock 2. Acute systolic HF EF 68-34%, etiology uncertain.  Brother has dilated cardiomyopathy so possibly familial.  Reports drinking 6 beers/day long-term.  3. Acute respiratory failure 4. Multisystem organ failure 5. Acute kidney injury 6. Shock liver 7. Hyperkalemia 8. Cervical lymphadenopathy - path appears to be Hodgkins lymphoma. Adcetria given 9/30    --s/p core biopsy 9/27 9. Tobacco use 10. ETOH use - 4+ beers per day 11. Family h/o NICM in brother 14. Massive upper GI bleed     --severe esophagitis by EGD on 9/27 13. Coagulopathy - due to shock liver with ? DIC 14. ?CAP  Plan/Discussion:  Remains in cardiogenic shock despite 4 pressors.  Prognosis increasingly concerning with multisystem organ failure and persistent cardiogenic shock . Not  candidate for mechanical support. Not candidate for outpatient HD on inotropes.   The patient is critically ill with multiple organ systems failure and requires high complexity decision making for assessment and support, frequent evaluation and titration of therapies, application of advanced monitoring technologies and extensive interpretation of multiple databases.   Critical Care Time devoted to patient care services described in this note is 35 Minutes.    Darrick Grinder, NP-C  3:15 PM Advanced Heart Failure Team Pager [phone] (M-F; 7a - 4p)  Please contact [facility] for night-coverage after hours (4p -7a ) and weekends on [email or web address]  Patient seen and examined with Darrick Grinder, NP. We discussed all aspects of the encounter. I agree with the assessment and plan as stated above.   Now on triple pressors with SBP 70-90. Co-ox improved but still tenuous. I had a frank talk with him and his wife today about the lack of options and poor prognosis. We discussed comfort care. They are discussing their plans and will let us know.   The patient is critically ill with multiple organ systems failure and requires high complexity decision making for assessment and support, frequent evaluation and titration of therapies, application of advanced monitoring technologies and extensive interpretation of multiple databases.   Critical Care Time devoted to patient care services described in this note is 35 Minutes.  Julie-Anne Torain,MD 5:31 PM

## 2015-10-12 NOTE — Progress Notes (Addendum)
Patient ID: Paul Morton, male   DOB: 02/11/1961, 54 y.o.   MRN: [id number] S:no new complaints O:BP 91/55 mmHg  Pulse 93  Temp(Src) 97.3 F (36.3 C) (Axillary)  Resp 17  Ht 5' 11.5" (1.816 m)  Wt 70.1 kg (154 lb 8.7 oz)  BMI 21.26 kg/m2  SpO2 100%  Intake/Output Summary (Last 24 hours) at 10/12/15 0741 Last data filed at 10/12/15 0700  Gross per 24 hour  Intake 2020.4 ml  Output   1736 ml  Net  284.4 ml   Intake/Output: I/O last 3 completed shifts: In: 2667.1 [I.V.:2467.1; IV Piggyback:200] Out: 2585 [Other:2110; Stool:475]  Intake/Output this shift:    Weight change: -0.8 kg (-1 lb 12.2 oz) DDU:KGURKYHCW, cachectic WM in NAD but appears fatigued CVS:rrr no rub Resp:cta CBJ:SEGBTD Ext:no edema   Recent Labs Lab 10/06/15 0339  10/07/15 0402  10/08/15 0409  10/09/15 0420 10/09/15 1614 10/10/15 0444 10/10/15 1600 10/11/15 0440 10/11/15 1650 10/12/15 0331 10/12/15 0338  NA  --   < > 139  < > 137  < > 138 137 134* 136 134*  134* 134*  --  134*  K  --   < > 4.5  < > 4.4  < > 4.7 4.4 4.6 4.2 4.0  4.0 4.3  --  4.3  CL  --   < > 102  < > 99*  < > 101 102 99* 101 99*  98* 99*  --  100*  CO2  --   < > 27  < > 27  < > 26 25 26 25 27  27 26   --  27  GLUCOSE  --   < > 97  < > 96  < > 108* 126* 110* 136* 121*  123* 135*  --  137*  BUN  --   < > 16  < > 15  < > 14 15 14 11 9  7 10   --  13  CREATININE  --   < > 1.15  < > 1.02  < > 1.02 0.95 1.03 1.02 1.00  0.97 0.92  --  0.94  ALBUMIN 1.8*  < > 1.8*  < > 1.8*  1.8*  < > 1.8* 1.7* 1.7*  1.7* 1.6* 1.6*  1.6* 1.5* 1.4* 1.5*  CALCIUM  --   < > 8.2*  < > 8.0*  < > 7.9* 7.5* 7.3* 7.6* 7.1*  7.2* 7.0*  --  7.0*  PHOS  --   < > 2.6  < > 2.4*  < > 2.8 2.6 2.3* 2.1* 2.0* 2.0*  --  2.1*  AST 55*  --  52*  --  57*  --  58*  --  44*  --  40  --  53*  --   ALT 71*  --  64*  --  61  --  61  --  56  --  53  --  50  --   < > = values in this interval not displayed. Liver Function Tests:  Recent Labs Lab 10/10/15 0444   10/11/15 0440 10/11/15 1650 10/12/15 0331 10/12/15 0338  AST 44*  --  40  --  53*  --   ALT 56  --  53  --  50  --   ALKPHOS 182*  --  177*  --  144*  --   BILITOT 8.0*  --  8.5*  --  9.1*  --   PROT 5.1*  --  4.7*  --  4.6*  --   ALBUMIN 1.7*  1.7*  < > 1.6*  1.6* 1.5* 1.4* 1.5*  < > = values in this interval not displayed. No results for input(s): LIPASE, AMYLASE in the last 168 hours. No results for input(s): AMMONIA in the last 168 hours. CBC:  Recent Labs Lab 10/06/15 0339 10/07/15 0402  10/09/15 0420 10/10/15 0444 10/11/15 0440  WBC 16.7* 14.2*  --  3.1* 2.7* 4.6  HGB 8.5* 8.2*  < > 8.2* 8.3* 8.8*  HCT 26.6* 25.4*  < > 26.5* 25.9* 26.9*  MCV 74.7* 74.7*  --  75.5* 75.7* 73.7*  PLT 139* 136*  --  157 111* 86*  < > = values in this interval not displayed. Cardiac Enzymes: No results for input(s): CKTOTAL, CKMB, CKMBINDEX, TROPONINI in the last 168 hours. CBG:  Recent Labs Lab 10/11/15 0812 10/11/15 1153 10/11/15 1931 10/11/15 2354 10/12/15 0334  GLUCAP 118* 125* 132* 124* 121*    Iron Studies: No results for input(s): IRON, TIBC, TRANSFERRIN, FERRITIN in the last 72 hours. Studies/Results: Dg Abd Portable 1v  10/10/2015  CLINICAL DATA:  Severe abdominal pain with nausea EXAM: PORTABLE ABDOMEN - 1 VIEW COMPARISON:  10/09/2015 FINDINGS: The bowel gas pattern is normal. No radio-opaque calculi or other significant radiographic abnormality are seen. IMPRESSION: No bowel obstruction. Electronically Signed   By: Kathreen Devoid   On: 10/10/2015 16:48   . acyclovir  400 mg Per Tube Daily  . antiseptic oral rinse  7 mL Mouth Rinse BID  . dronabinol  5 mg Oral BID AC  . feeding supplement  1 Container Oral TID BM  . fluconazole  100 mg Oral Daily  . hydrocortisone sodium succinate  50 mg Intravenous Q8H  . metronidazole  500 mg Intravenous Q8H  . pantoprazole (PROTONIX) IV  40 mg Intravenous Q12H  . vancomycin  500 mg Oral 4 times per day    BMET    Component  Value Date/Time   NA 134* 10/12/2015 0338   NA 136 09/12/2015 0909   K 4.3 10/12/2015 0338   K 4.6 09/12/2015 0909   CL 100* 10/12/2015 0338   CL 98 09/12/2015 0909   CO2 27 10/12/2015 0338   CO2 25 09/12/2015 0909   GLUCOSE 137* 10/12/2015 0338   GLUCOSE 92 09/12/2015 0909   BUN 13 10/12/2015 0338   BUN 20 09/12/2015 0909   CREATININE 0.94 10/12/2015 0338   CREATININE 1.2 09/12/2015 0909   CALCIUM 7.0* 10/12/2015 0338   CALCIUM 9.8 09/12/2015 0909   GFRNONAA >60 10/12/2015 0338   GFRAA >60 10/12/2015 0338   CBC    Component Value Date/Time   WBC 4.6 10/11/2015 0440   WBC 13.3* 09/12/2015 0908   WBC 14.9* 09/08/2015 0931   RBC 3.65* 10/11/2015 0440   RBC 3.89* 09/12/2015 0908   RBC 4.14* 09/08/2015 0931   HGB 8.8* 10/11/2015 0440   HGB 9.9* 09/12/2015 0908   HGB 10.1* 09/08/2015 0931   HCT 26.9* 10/11/2015 0440   HCT 30.6* 09/12/2015 0908   HCT 32.3* 09/08/2015 0931   PLT 86* 10/11/2015 0440   PLT 528* 09/12/2015 0908   MCV 73.7* 10/11/2015 0440   MCV 79* 09/12/2015 0908   MCV 78.0* 09/08/2015 0931   MCH 24.1* 10/11/2015 0440   MCH 25.4* 09/12/2015 0908   MCH 24.3* 09/08/2015 0931   MCHC 32.7 10/11/2015 0440   MCHC 32.4 09/12/2015 0908   MCHC 31.2* 09/08/2015 0931   RDW 20.3* 10/11/2015 0440  RDW 16.6* 09/12/2015 0908   LYMPHSABS 0.7 10/04/2015 2255   LYMPHSABS 1.0 09/12/2015 0908   MONOABS 0.3 10/04/2015 2255   EOSABS 0.1 10/04/2015 2255   EOSABS 0.1 09/12/2015 0908   BASOSABS 0.0 10/04/2015 2255   BASOSABS 0.0 09/12/2015 0908     Assessment/Plan:  1. ARF, oliguric due to cardiogenic shock. Currently on pressors and CVVHD. Continue with current setting for now. Wean pressors as able. If not able to wean pressors, there is really no viable long term solution for this issue. If there is not an effective therapy available to improve his cardiac output and blood pressure, ongoing dialysis in any form would be futile. 1. Despite increasing to 4  pressors, he remains hypotensive and is barely tolerating CVVHD. He would never be able to tolerate IHD and therefore would recommend transitioning to comfort care. 2. If he becomes hypotensive again, will have to stop CVVHD.  We are now approaching end of life despite family's wishes and would not intubate/defib/escalate pressor support as any further intervention would be medically futile. 2. Cardiogenic shock due to acute systolic heart failure with EF 10-15% unclear etiology (?etoh related or familial). On levophed, dobutamine, and now neo gtt but still remains hypotensive.  1. Pt is deteriorating despite maximal effort from all involved.  2. Strongly recommend transition to comfort measures. 3. Hodgkins lymphoma- s/p Adcetria on 09/29/15 and per Heme has responded well to therapy but unclear if lymphadenopathy has responded. 4. Etoh use- 6 beers/day 5. Tobacco abuse 6. GI bleed due to severe esophagitis- now with heme + stools will d/c heparin with CVVHD, hgb stable 7. Shock liver due to systolic heart failure/CMP 8. Coagulopathy 9. Disposition- pt has poor overall prognosis related to his severe CMP (EF 10-15%) without considering lymphoma and renal failure. Unclear what endpoint will be but recommend hospice/palliative care consult to set goals/limits of care. As above without improved cardiac function and blood pressure, ongoing dialysis is futile.  [employer] [facility]

## 2015-10-12 NOTE — Progress Notes (Signed)
Despite everybody's help, the situation is clearly worsening.  He now is on 4 pressors and his blood pressure is still with a systolic of about 80.  His bilirubin is going up.  I think it is apparent that his cardiac status is failing. As much as he is trying, as hard as the medicines are working, it still is not going to be enough in my opinion.  I would think that given the rate of decline, I sincerely doubt that his prognosis will be more than a week. He is still a full code. I'm sure this will have to be addressed.  I talked to him myself. I gave him my "take" on his situation. His Hodgkin's disease has responded to treatment. I'm sure he still has Hodgkin's but it definitely is not of the burden that it once was. As such, he is not going to be given any more treatment unless he makes a dramatic recovery from his cardiac failure. He does understand this.  He continues on continual dialysis. His BUN and creatinine have been doing okay.  He has not had a CBC today. His bilirubin is up to 9.1. His albumin is only 1.5. I'm sure that his prealbumin is incredibly low.  On his physical exam, his blood pressure is 83/59. His temperature is 97.3. Pulse is 102. His lungs sound clear bilaterally. I'm sure that the dialysis is helping get fluid off of him. Cardiac exam is tachycardic but regular. He has a 1/6 systolic murmur. Abdomen is soft. There is no fluid wave. There is no palpable liver or spleen tip. Extremities shows no edema.  Again, I think given the decline that we are seen and the fact that he is now on 4 pressors, his prognosis will be incredibly limited. I would hate to see him go back on life support. I'm not sure that really would change the situation.  His Hodgkin's disease has responded to therapy. This is not going to be a problem in the future.  We will continue pray hard for him.  I'm not sure how much his wife is aware of. I'm sure that the other doctors have talked to her about  the severity of the situation.  [place] 3:22-23

## 2015-10-12 NOTE — Progress Notes (Signed)
  Spoke to patient's wife Sharee Pimple.   They want to continue inotropes and CVVHD for now but she realizes his condition is terminal.  They request DNR/DNI.  Also, if he becomes delirious or unconscious she would want switch to full comfort care so he doesn't suffer.   Nurse notified. Code Status changed.   Chandell Attridge,MD 5:46 PM

## 2015-10-12 NOTE — Progress Notes (Signed)
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   Completed bed level exercise with pt working at a moderately hard level.  Needed frequent rest due to increasing HR into the 120's.  Stopped before pt visibly ready to stop.  Pt stated 8/10 on perceived exertion.   Follow Up Recommendations  Other (comment) ((TBA as progresses off CRRT))     Equipment Recommendations  None recommended by PT    Recommendations for Other Services       Precautions / Restrictions Precautions Precautions: Fall Precaution Comments: keep MAP>60    Mobility  Bed Mobility               General bed mobility comments: unable to get to EOB due to CRRT  Transfers                    Ambulation/Gait                 Stairs            Wheelchair Mobility    Modified Rankin (Stroke Patients Only)       Balance                                    Cognition Arousal/Alertness: Awake/alert Behavior During Therapy: WFL for tasks assessed/performed;Flat affect Overall Cognitive Status: Impaired/Different from baseline         Following Commands: Follows one step commands with increased time   Awareness: Intellectual Problem Solving: Slow processing      Exercises General Exercises - Upper Extremity Elbow Flexion: AROM;Strengthening;Both;10 reps;Supine (graded resistance) Elbow Extension: Strengthening;Both;AROM;10 reps (graded resistance) General Exercises - Lower Extremity Heel Slides: AROM;Strengthening;Both;10  reps;Supine (graded resistance) Hip ABduction/ADduction: AROM;Strengthening;Both;15 reps;Supine    General Comments General comments (skin integrity, edema, etc.): HR during exercise generally in 110's, max 124 bpm  Sats in mid to upper 90's      Pertinent Vitals/Pain Pain Assessment: No/denies pain    Home Living                      Prior Function            PT Goals (current goals can now be found in the care plan section) Acute Rehab PT Goals Patient Stated Goal: get well  PT Goal Formulation: With patient Time For Goal Achievement: 10/22/15 Potential to Achieve Goals: Fair Progress towards PT goals: Not progressing toward goals - comment (limited due to hypotn, CRRT)    Frequency  Min 3X/week    PT Plan Current plan remains appropriate    Co-evaluation             End of Session   Activity Tolerance: Treatment limited secondary to medical complications (Comment);Other (comment) (hypotensive, on pressors and CRRT) Patient left: in bed;with call bell/phone within reach;with SCD's reapplied     Time: 9675-9163 PT Time Calculation (min) (ACUTE ONLY): 17 min  Charges:  $Therapeutic Exercise: 8-22 mins  G Codes:      Yosmar Ryker, Tessie Fass 10/12/2015, 4:00 PM 10/12/2015  Donnella Sham, [place] [phone]  (pager)

## 2015-10-13 DIAGNOSIS — R627 Adult failure to thrive: Secondary | ICD-10-CM

## 2015-10-13 LAB — RENAL FUNCTION PANEL
ALBUMIN: 1.4 g/dL — AB (ref 3.5–5.0)
Anion gap: 6 (ref 5–15)
BUN: 15 mg/dL (ref 6–20)
CO2: 25 mmol/L (ref 22–32)
CREATININE: 1.04 mg/dL (ref 0.61–1.24)
Calcium: 6.8 mg/dL — ABNORMAL LOW (ref 8.9–10.3)
Chloride: 100 mmol/L — ABNORMAL LOW (ref 101–111)
Glucose, Bld: 139 mg/dL — ABNORMAL HIGH (ref 65–99)
PHOSPHORUS: 2 mg/dL — AB (ref 2.5–4.6)
POTASSIUM: 3.7 mmol/L (ref 3.5–5.1)
Sodium: 131 mmol/L — ABNORMAL LOW (ref 135–145)

## 2015-10-13 LAB — CBC
HCT: 20.9 % — ABNORMAL LOW (ref 39.0–52.0)
Hemoglobin: 6.8 g/dL — CL (ref 13.0–17.0)
MCH: 24.5 pg — AB (ref 26.0–34.0)
MCHC: 32.5 g/dL (ref 30.0–36.0)
MCV: 75.2 fL — AB (ref 78.0–100.0)
PLATELETS: 43 10*3/uL — AB (ref 150–400)
RBC: 2.78 MIL/uL — ABNORMAL LOW (ref 4.22–5.81)
RDW: 21.6 % — AB (ref 11.5–15.5)
WBC: 7.6 10*3/uL (ref 4.0–10.5)

## 2015-10-13 LAB — HEPATIC FUNCTION PANEL
ALT: 53 U/L (ref 17–63)
AST: 58 U/L — ABNORMAL HIGH (ref 15–41)
Albumin: 1.4 g/dL — ABNORMAL LOW (ref 3.5–5.0)
Alkaline Phosphatase: 140 U/L — ABNORMAL HIGH (ref 38–126)
BILIRUBIN DIRECT: 5.5 mg/dL — AB (ref 0.1–0.5)
BILIRUBIN INDIRECT: 3.6 mg/dL — AB (ref 0.3–0.9)
BILIRUBIN TOTAL: 9.1 mg/dL — AB (ref 0.3–1.2)
Total Protein: 4.1 g/dL — ABNORMAL LOW (ref 6.5–8.1)

## 2015-10-13 LAB — MAGNESIUM: MAGNESIUM: 2.2 mg/dL (ref 1.7–2.4)

## 2015-10-13 LAB — CARBOXYHEMOGLOBIN
CARBOXYHEMOGLOBIN: 2 % — AB (ref 0.5–1.5)
Methemoglobin: 0.9 % (ref 0.0–1.5)
O2 SAT: 49.3 %
TOTAL HEMOGLOBIN: 6.6 g/dL — AB (ref 13.5–18.0)

## 2015-10-13 LAB — APTT: APTT: 33 s (ref 24–37)

## 2015-10-13 MED ORDER — MORPHINE SULFATE (PF) 2 MG/ML IV SOLN: 2.0000 mg | INTRAVENOUS | Status: DC | PRN

## 2015-10-13 MED ORDER — MORPHINE SULFATE 25 MG/ML IV SOLN
1.0000 mg/h | INTRAVENOUS | Status: DC
  Administered 2015-10-13: 10 mg/h via INTRAVENOUS
  Administered 2015-10-13: 40 mg/h via INTRAVENOUS
  Administered 2015-10-13: 35 mg/h via INTRAVENOUS
  Filled 2015-10-13 (×3): qty 10

## 2015-10-13 MED ORDER — MORPHINE BOLUS VIA INFUSION
1.0000 mg | INTRAVENOUS | Status: DC | PRN
  Administered 2015-10-13: 35 mg via INTRAVENOUS
  Administered 2015-10-13: 40 mg via INTRAVENOUS
  Filled 2015-10-13 (×3): qty 2

## 2015-10-13 NOTE — Progress Notes (Signed)
Pt heart rhythm on the monitor flat line at 2302. Two RN again auscultated for heart sounds and found them to be absent. Alessandra Grout, RN, and Chelsea Primus, RN

## 2015-10-13 NOTE — Progress Notes (Signed)
Pt refuses to wear pulse ox.

## 2015-10-13 NOTE — Progress Notes (Signed)
Pt was assessed with family at bedside. Pt is currently under comfort care. Pt breathing was labored and agonal. Pain medication was increased.

## 2015-10-13 NOTE — Progress Notes (Signed)
Patient is adamant that he wants to stop all medical treatment, such as CRRT & pressors.  Have informed [facility] and Dr. Jerline Pain of the patient's wishes.  Patient is of sound mind and is alert and oriented x 4.  I have tried to contact the patient's wife on 6 different times and left two messages, however, she has not returned my call.  Currently waiting on wife to return call.

## 2015-10-13 NOTE — Progress Notes (Signed)
    0341  Clinical Encounter Type  Visited With Patient;Health care provider  Visit Type Initial;Patient actively dying  Referral From Nurse  Spiritual Encounters  Spiritual Needs Emotional  Stress Factors  Patient Stress Factors Major life changes   Chaplain responded to a request from a nurse for the patient who is looking to resist treatment. Patient expressed that he did not feel like himself at all, and that feeling being really frustrating, and that it was "time to go." Chaplain offered support and will attempt to follow-up when wife arrives. Chaplain support available as needed.   Jeri Lager, Chaplain  3:44 AM

## 2015-10-13 NOTE — Progress Notes (Signed)
Nutrition Brief Note  Chart reviewed. Pt now transitioning to comfort care.  No further nutrition interventions warranted at this time.  Please re-consult as needed.    Kimberly Harris, RD, LDN, CNSC Pager [phone] After Hours Pager [phone]    

## 2015-10-13 NOTE — Progress Notes (Signed)
[name] has decided to withdraw all of his medical treatments.  He realizes that he is not going to get better.  I talked to him this morning. He clearly is oriented and very alert as to his situation. He realizes that his cardiac status just is not going to improve and that he does not want to live as he is doing right now on a dialysis machine and on medications.  I really cannot blame him for this. I totally understand his perspective.  I would think that once medications are with drawn, that he will not last long. I would think that the critical care doctors will put him on some type of analgesic drip for any discomfort that he might have.  He certainly has done all that we have asked him to do. Despite this, and despite maximal interventions, his cardiac status just is not improving. His blood pressure still with a systolic in the 69C.  He did eat a little bit yesterday.  He is not hurting. He's had no nausea or vomiting. He's not really making urine. He is having some bowel movements.  He has not had any labs done today.  Again, I had a lengthy discussion with him today. Again, he is very coherent and very logical in his thinking. I think he was made a DO NOT RESUSCITATE yesterday.  On his physical exam, temperature is 97.3. Pulse 102. Blood pressure 79/48. Oxygen saturation is 100%. His lungs sound clear. No wheezing is noted. Cardiac exam tachycardic but regular. Abdomen is soft. Bowel sounds present. Extremities shows no edema.  This is truly a very sad today for me. [name] is very nice. He, unfortunately, just has a very difficult problem in which there is no solution.  I know that I will see him again. He will win, and we will lose.  I will pray for him and his family. His wife is very nice.  Lum Keas  2 Timothy 4:17-18

## 2015-10-13 NOTE — Progress Notes (Signed)
  Decision for withdrawal of care noted. Unfortunately, I feel it is the only viable option.  We will follow as needed for support.   Paul Monts,MD 12:11 PM

## 2015-10-13 NOTE — Progress Notes (Signed)
 PT Cancellation and DISCHARGE Note  Patient Details Name: Paul Morton MRN: [id number] DOB: 1961-03-23   Cancelled Treatment:    Reason Eval/Treat Not Completed: PT screened, no needs identified, will sign off;Other (comment) (pt wishes to be removed from dialysis and move  to comfort care measures only).  Will sign off at this time.   Donnella Sham, [place] [phone]  (pager)  Fishel Wamble, Tessie Fass , 10:09 AM

## 2015-10-13 NOTE — Progress Notes (Signed)
Family at bedside. 

## 2015-10-13 NOTE — Progress Notes (Signed)
 Called to patient's room. Patient is requesting to stop CRRT. Patient noted to be fully alert with clear though process. Stated that he was tired and did not want further treatment. Patient understood that CRRT was one of the interventions that was helping keep him alive, however still wished to stop. Patient requested that his wife be called. Several attempts were made to call the patient's wife, however the phone goes straight to voice mail. Nursing staff left message for patient's wife to call. Patient was made aware that we were not able to contact his wife and then agreed to wait until his wife arrived before stopping the CRRT.  Algis Greenhouse. Jerline Pain, [place] Resident PGY-2  4:31 AM

## 2015-10-13 NOTE — Progress Notes (Signed)
RN called Mrs. Kingma to updated on pt condition. Pt heart dropped from 90's to 30's/40's. Pt is agonal breathing but otherwise resting comfortably in the bed.

## 2015-10-13 NOTE — Progress Notes (Signed)
 Patient ID: Paul Morton, male   DOB: 25-May-1961, 54 y.o.   MRN: [id number] S:decision to stop therapy is noted.  Appreciate the time Dr. Marin Olp spent with Paul Morton to help him understand his condition and overall prognosis O:BP 83/57 mmHg  Pulse 25  Temp(Src) 97.4 F (36.3 C) (Oral)  Resp 15  Ht 5' 11.5" (1.816 m)  Wt 68.6 kg (151 lb 3.8 oz)  BMI 20.80 kg/m2  SpO2 100%  Intake/Output Summary (Last 24 hours) at  0838 Last data filed at  0700  Gross per 24 hour  Intake 2346.9 ml  Output   1600 ml  Net  746.9 ml   Intake/Output: I/O last 3 completed shifts: In: 3586.1 [P.O.:360; I.V.:2706.1; Other:220; IV Piggyback:300] Out: 2645 [Other:2595; Stool:50]  Intake/Output this shift:    Weight change: -1.5 kg (-3 lb 4.9 oz) FOY:DXAJO, janudiced, WM in NAD    Recent Labs Lab 10/07/15 0402  10/08/15 0409  10/09/15 0420  10/10/15 0444 10/10/15 1600 10/11/15 0440 10/11/15 1650 10/12/15 0331 10/12/15 0338 10/12/15 1610  0721  0722  NA 139  < > 137  < > 138  < > 134* 136 134*  134* 134*  --  134* 136  --  131*  K 4.5  < > 4.4  < > 4.7  < > 4.6 4.2 4.0  4.0 4.3  --  4.3 4.0  --  3.7  CL 102  < > 99*  < > 101  < > 99* 101 99*  98* 99*  --  100* 103  --  100*  CO2 27  < > 27  < > 26  < > 26 25 27  27 26   --  27 26  --  25  GLUCOSE 97  < > 96  < > 108*  < > 110* 136* 121*  123* 135*  --  137* 162*  --  139*  BUN 16  < > 15  < > 14  < > 14 11 9  7 10   --  13 15  --  15  CREATININE 1.15  < > 1.02  < > 1.02  < > 1.03 1.02 1.00  0.97 0.92  --  0.94 1.06  --  1.04  ALBUMIN 1.8*  < > 1.8*  1.8*  < > 1.8*  < > 1.7*  1.7* 1.6* 1.6*  1.6* 1.5* 1.4* 1.5* 1.5* 1.4* 1.4*  CALCIUM 8.2*  < > 8.0*  < > 7.9*  < > 7.3* 7.6* 7.1*  7.2* 7.0*  --  7.0* 7.1*  --  6.8*  PHOS 2.6  < > 2.4*  < > 2.8  < > 2.3* 2.1* 2.0* 2.0*  --  2.1* 2.1*  --  2.0*  AST 52*  --  57*  --  58*  --  44*  --  40  --  53*  --   --  58*  --   ALT 64*  --  61  --  61  --  56  --   53  --  50  --   --  53  --   < > = values in this interval not displayed. Liver Function Tests:  Recent Labs Lab 10/11/15 0440  10/12/15 0331  10/12/15 1610  0721  0722  AST 40  --  53*  --   --  58*  --   ALT 53  --  50  --   --  53  --  ALKPHOS 177*  --  144*  --   --  140*  --   BILITOT 8.5*  --  9.1*  --   --  9.1*  --   PROT 4.7*  --  4.6*  --   --  4.1*  --   ALBUMIN 1.6*  1.6*  < > 1.4*  < > 1.5* 1.4* 1.4*  < > = values in this interval not displayed. No results for input(s): LIPASE, AMYLASE in the last 168 hours. No results for input(s): AMMONIA in the last 168 hours. CBC:  Recent Labs Lab 10/09/15 0420 10/10/15 0444 10/11/15 0440 10/12/15 1224  0721  WBC 3.1* 2.7* 4.6 6.3 7.6  HGB 8.2* 8.3* 8.8* 7.5* 6.8*  HCT 26.5* 25.9* 26.9* 23.6* 20.9*  MCV 75.5* 75.7* 73.7* 74.0* 75.2*  PLT 157 111* 86* 42* 43*   Cardiac Enzymes: No results for input(s): CKTOTAL, CKMB, CKMBINDEX, TROPONINI in the last 168 hours. CBG:  Recent Labs Lab 10/12/15 0334 10/12/15 0755 10/12/15 1149 10/12/15 1530 10/12/15 1941  GLUCAP 121* 144* 149* 138* 148*    Iron Studies: No results for input(s): IRON, TIBC, TRANSFERRIN, FERRITIN in the last 72 hours. Studies/Results: No results found. Marland Kitchen acyclovir  400 mg Per Tube Daily  . antiseptic oral rinse  7 mL Mouth Rinse BID  . dronabinol  5 mg Oral BID AC  . feeding supplement  1 Container Oral TID BM  . fluconazole  100 mg Oral Daily  . hydrocortisone sodium succinate  50 mg Intravenous Q12H  . metronidazole  500 mg Intravenous Q8H  . pantoprazole (PROTONIX) IV  40 mg Intravenous Q12H  . vancomycin  500 mg Oral 4 times per day    BMET    Component Value Date/Time   NA 131*  0722   NA 136 09/12/2015 0909   K 3.7  0722   K 4.6 09/12/2015 0909   CL 100*  0722   CL 98 09/12/2015 0909   CO2 25  0722   CO2 25 09/12/2015 0909   GLUCOSE 139*  0722    GLUCOSE 92 09/12/2015 0909   BUN 15  0722   BUN 20 09/12/2015 0909   CREATININE 1.04  0722   CREATININE 1.2 09/12/2015 0909   CALCIUM 6.8*  0722   CALCIUM 9.8 09/12/2015 0909   GFRNONAA >60  0722   GFRAA >60  0722   CBC    Component Value Date/Time   WBC 7.6  0721   WBC 13.3* 09/12/2015 0908   WBC 14.9* 09/08/2015 0931   RBC 2.78*  0721   RBC 3.89* 09/12/2015 0908   RBC 4.14* 09/08/2015 0931   HGB 6.8*  0721   HGB 9.9* 09/12/2015 0908   HGB 10.1* 09/08/2015 0931   HCT 20.9*  0721   HCT 30.6* 09/12/2015 0908   HCT 32.3* 09/08/2015 0931   PLT 43*  0721   PLT 528* 09/12/2015 0908   MCV 75.2*  0721   MCV 79* 09/12/2015 0908   MCV 78.0* 09/08/2015 0931   MCH 24.5*  0721   MCH 25.4* 09/12/2015 0908   MCH 24.3* 09/08/2015 0931   MCHC 32.5  0721   MCHC 32.4 09/12/2015 0908   MCHC 31.2* 09/08/2015 0931   RDW 21.6*  0721   RDW 16.6* 09/12/2015 0908   LYMPHSABS 0.7 10/04/2015 2255   LYMPHSABS 1.0 09/12/2015 0908   MONOABS 0.3 10/04/2015 2255   EOSABS 0.1 10/04/2015 2255   EOSABS 0.1 09/12/2015 0908  BASOSABS 0.0 10/04/2015 2255   BASOSABS 0.0 09/12/2015 0908     Assessment/Plan:  1. ARF, oliguric due to cardiogenic shock. Currently off CVVHD per patient's request and support his decision. 1. Agree with transition to comfort measures 2. Cardiogenic shock due to acute systolic heart failure with EF 10-15% unclear etiology (?etoh related or familial). On levophed, dobutamine, and now neo gtt but still remains hypotensive.  1. As above, weaning pressors 3. Hodgkins lymphoma- s/p Adcetria on 09/29/15 and per Heme has responded well to therapy but unclear if lymphadenopathy has responded. 4. Etoh use- 6 beers/day 5. Tobacco abuse 6. GI bleed due to severe esophagitis- now with heme + stools will d/c heparin with CVVHD, hgb  stable 7. Shock liver due to systolic heart failure/CMP 8. Coagulopathy 9. Disposition- pt has poor overall prognosis related to his severe CMP (EF 10-15%) without considering lymphoma and renal failure. Appreciate hospice/palliative care consult to set goals/limits of care as well as Oncology's input and guidance.  [place] A

## 2015-10-13 NOTE — Progress Notes (Signed)
Spoke with Sharee Pimple (patient's wife) she plans to come up in "about an hour".

## 2015-10-13 NOTE — Progress Notes (Signed)
RN enter the room to assess pt. Pt had no pulse and was not breathing. Auscultation of heart sounds was negative. Two RN assess pt heart sounds and found them to be absent. Alessandra Grout, RN and Chelsea Primus, RN auscultated hearts sound. Dr. Johnette Abraham [place], with [facility] was notified.

## 2015-10-13 NOTE — Progress Notes (Signed)
Into room with Dr. Elsworth Soho this morning. Patient verbally expressed the desire to remove himself from dialysis and IV drips and that he desires a natural death. His wife has been called multiple times in hopes that she would come to bedside. Will continue calling his wife to have her come to bedside. Patient desires wife at bedside prior to removal of devices and treatments. Will continue to monitor patient very closely.

## 2015-10-13 NOTE — Progress Notes (Signed)
Dr. Jerline Pain notified of Hcg 6.8 panic value.

## 2015-10-13 NOTE — Progress Notes (Signed)
    1100  Clinical Encounter Type  Visited With Family;Patient not available  Visit Type Patient actively dying  Referral From Nurse  Spiritual Encounters  Spiritual Needs Grief support  [place] called to floor to be present with family as support withdrawn; [place] notified family and offered to be present and for prayer if desired; [place] to remain outside to allow privacy for family.  11:13 AM Gwynn Burly

## 2015-10-13 NOTE — Progress Notes (Signed)
 PULMONARY / CRITICAL CARE MEDICINE   Name: Paul Morton MRN: [id number] DOB: 1961-10-11    ADMISSION DATE:  09/22/2015  REFERRING MD :  EDP  CHIEF COMPLAINT:  Sepsis   INITIAL PRESENTATION:  54yo male smoker with recent diagnosis lymphoma (still in early stages of workup) who presented 9/25 with progressive back pain.  In ER found to be tachycardic, hypotensive with lactate 14, AKI and coagulopathy.  PCCM called to admit.  Found to have severe cardiomyopathy with EF 15%   STUDIES:  PET 9/22 - Bulky confluent hypermetabolic left cervical lymphadenopathy involving levels II-V. Given the presence of hepatomegaly with diffuse liver hypermetabolism, a diagnosis of lymphoma is favored, however metastatic carcinoma remains on the differential.  Hypermetabolic patchy ground-glass opacity and consolidation in both upper lobes and right lower lobe, favor an infectious or inflammatory etiology.  TTE 9/26 - EF 10-15% w/o regional wall motion abnormality. Grade 3 diastolic dysfunction. Moderate AR. Moderate MR. RV severely dilated w/ moderate reduction in systolic function. PASP 43 mmHg. No pericardial effusion.  RLE Venous Duplex 9/26 - no DVT or SVT  MRI spine 9/25 - incomplete exam, No evidence of significant spinal stenosis or cord compression in the cervical or visualized thoracic spine.   MRI L-spine 9/27 - suboptimal exam. No malignant or suspicious signal.  Echo repeat 10/8- no changes  SIGNIFICANT EVENTS: 9/25 - Admitted to hospital 9/25 - Right IJ CVC by ED 9/26 - Intubated for pending respiratory failure & transferred to [facility] 9/27 - EGD w/ erosive esophagitis 9/27 - FNA neck mass 9/30 - Self extubation during SBT 10/1 - Reintubated  10/4 cvvhd started 10/5- hd cathter out, new placed, cvvhd restarted 10/6- extubated 10/7 - not  Responding to dobutamine 10/11 hypotensive - neo added  SUBJECTIVE:  Overnight events noted-patient requesting to stop CRRT, wife called and  awaiting her arrival Remains Hypotensive on Dobutamine  , levophed at max dose, neo added No  abd pain - flexiseal with loose stools, dark Remains on CVVH   VITAL SIGNS: Temp:  [97.3 F (36.3 C)-97.4 F (36.3 C)] 97.4 F (36.3 C) (10/14 0748) Pulse Rate:  [25-111] 25 (10/14 0530) Resp:  [13-30] 15 (10/14 0800) BP: (61-118)/(28-81) 84/55 mmHg (10/14 0800) SpO2:  [70 %-100 %] 100 % (10/14 0600) Weight:  [68.6 kg (151 lb 3.8 oz)] 68.6 kg (151 lb 3.8 oz) (10/14 0500) HEMODYNAMICS: CVP:  [5 mmHg] 5 mmHg VENTILATOR SETTINGS:   INTAKE / OUTPUT:  Intake/Output Summary (Last 24 hours) at  1300 Last data filed at  1000  Gross per 24 hour  Intake   2053 ml  Output   1315 ml  Net    738 ml    PHYSICAL EXAMINATION: General: chr ill, on CVVH Neck- no stridor, no jvd HEENT: , lymphad  decreased Cardiovascular:  s1 s2 rrt no m Pulmonary: decreased BL Abdomen: Soft, non tender Neurological: A O x 3  LABS:  CBC  Recent Labs Lab 10/11/15 0440 10/12/15 1224  0721  WBC 4.6 6.3 7.6  HGB 8.8* 7.5* 6.8*  HCT 26.9* 23.6* 20.9*  PLT 86* 42* 43*   Coag's  Recent Labs Lab 10/12/15 0331 10/12/15 0550  0721  APTT >200* >200* 33   BMET  Recent Labs Lab 10/12/15 0338 10/12/15 1610  0722  NA 134* 136 131*  K 4.3 4.0 3.7  CL 100* 103 100*  CO2 27 26 25   BUN 13 15 15   CREATININE 0.94 1.06 1.04  GLUCOSE 137* 162* 139*  Electrolytes  Recent Labs Lab 10/11/15 0440  10/12/15 0331 10/12/15 0338 10/12/15 1610  0721  0722  CALCIUM 7.1*  7.2*  < >  --  7.0* 7.1*  --  6.8*  MG 2.3  --  2.4  --   --  2.2  --   PHOS 2.0*  < >  --  2.1* 2.1*  --  2.0*  < > = values in this interval not displayed.   Sepsis Markers  Recent Labs Lab 10/07/15 1204  LATICACIDVEN 1.12   ABG  Recent Labs Lab 10/10/15 2243  PHART 7.519*  PCO2ART 30.6*  PO2ART 79.0*   Liver Enzymes  Recent Labs Lab 10/11/15 0440   10/12/15 0331  10/12/15 1610  0721  0722  AST 40  --  53*  --   --  58*  --   ALT 53  --  50  --   --  53  --   ALKPHOS 177*  --  144*  --   --  140*  --   BILITOT 8.5*  --  9.1*  --   --  9.1*  --   ALBUMIN 1.6*  1.6*  < > 1.4*  < > 1.5* 1.4* 1.4*  < > = values in this interval not displayed. Cardiac Enzymes No results for input(s): TROPONINI, PROBNP in the last 168 hours. Glucose  Recent Labs Lab 10/11/15 2354 10/12/15 0334 10/12/15 0755 10/12/15 1149 10/12/15 1530 10/12/15 1941  GLUCAP 124* 121* 144* 149* 138* 148*    Imaging No results found.  ASSESSMENT / PLAN:  PULMONARY ETT 9/26>>>9/30 (self extubated)>10/1  A: Acute hypoxic respiratory failure 2nd to HCAP with ARDS >> less likely pulmonary edema -resolved Hx of tobacco abuse. 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 Shock - combination of sepsis and cardiac. R/o  Relative adrenal insuff -cortisol dropped so significantly and on pressors weeks  Poor response dobutmaine P:  Levo titrate down if able Neo gtt, add vaso Ct stress steroids empiric  RENAL A: Acute Renal Failure >> likely from CHF, sepsis, and diuresis P:   cvvhd to be stopped soon   GASTROINTESTINAL A: Shock liver - improving. Upper GIB - secondary to erosive esophagitis on EGD. Protein calorie malnutrition. R/o ileus hyperbili (lymphoma ), cholestasis likely, shock liver component  Loose stools, prior cdiff neg P:   ppi renal diet flexi seal, imodium    HEMATOLOGIC A: New dx of Hodgkin's lymphoma Coagulopathy >> resolved. Anemia of critical illness, and GI bleed. Thrombocytopenia. P:  SCDs Chemotherapy per onc -hold off on CT neck Limit phlebotomy  INFECTIOUS A: Resolved hcap C diff colitis P:   Vancomycin 9/25>>>off Zosyn 9/25>>>full course completed Zovirax  for prophylaxis  Sputum 10/01 >>candida   PO vanc 10/12 >> Flagyl 10/12 >> Anticipate 14 ds of c diff therapy  ENDOCRINE A: Hyperglycemia. Rel AI P:   SSI if CBG > 180 Empiric steroids -start taper  NEUROLOGIC A: Delirium -resolved P:   PT if improves  Family - Palliative care following -wife Sharee Pimple involved Patient seems to be competent to make a decision about withdrawal of life support  Summary - persistent shock - Cardiogenic rather than septic - not a candidate for LVAD Cholestasis ? Related to shock liver vs cardiac congestion After further discussion, CRRT will be stopped today. DO NOT  RESUSCITATE issued. Orders written for withdrawal off pressors-I expect him to pass   The patient is critically ill with multiple organ systems failure and requires high complexity decision making for assessment and support, frequent evaluation and titration of therapies, application of advanced monitoring technologies and extensive interpretation of multiple databases. Critical Care Time devoted to patient care services described in this note independent of APP time is 31 minutes.   Kara Mead MD. [place]. [place] [facility] Pager [phone] If no response call [phone]

## 2015-10-13 NOTE — Progress Notes (Signed)
RN called Paul Morton to let her know that the pt has passed away.

## 2015-10-17 ENCOUNTER — Telehealth: Payer: Self-pay

## 2015-10-17 NOTE — Telephone Encounter (Signed)
This note was entered by mistake. °

## 2015-10-17 NOTE — Telephone Encounter (Signed)
On 10/17/2015 I received a death certificate from [employer] and [place]. The death certificate is for cremation. The patient is a patient of Doctor Elsworth Soho. The death certificate will be taken to the pulmonary unit for signature this pm.  On 10-30-2015 I received the death certificate back from [place]. I got the death certificate ready for pickup and called the funeral home to let them know that I was faxing a copy to the funeral home per their request.

## 2015-10-18 ENCOUNTER — Telehealth: Payer: Self-pay

## 2015-10-18 NOTE — Telephone Encounter (Signed)
On 10-20-15 I received the original death certificate from [place]. The death certificate is for cremation. The patient is a patient of Doctor Elsworth Soho. The death certificate will be taken to the pulmonary unit at [facility] for signature this afternoon. On 10/20/15 I received the death certificate back from Doctor Elsworth Soho. I got the death certificate ready for pickup and called the funeral home to let them know the death certificate is ready for pickup. I also faxed them a copy per their request.

## 2015-10-31 NOTE — Progress Notes (Signed)
Wasted 300cc Morphine with Chelsea Primus, RN, in sink.

## 2015-10-31 NOTE — Discharge Summary (Signed)
PULMONARY / CRITICAL CARE MEDICINE   Name: Paul Morton MRN: [id number] DOB: [birth year]    ADMISSION DATE:  09/18/2015  REFERRING MD :  EDP  CHIEF COMPLAINT:  Sepsis   INITIAL PRESENTATION:  54yo male smoker with recent diagnosis lymphoma (still in early stages of workup) who presented 9/25 with progressive back pain.  In ER found to be tachycardic, hypotensive with lactate 14, AKI and coagulopathy.  PCCM called to admit.  Found to have severe cardiomyopathy with EF 15%   STUDIES:  PET 9/22 - Bulky confluent hypermetabolic left cervical lymphadenopathy involving levels II-V. Given the presence of hepatomegaly with diffuse liver hypermetabolism, a diagnosis of lymphoma is favored, however metastatic carcinoma remains on the differential.  Hypermetabolic patchy ground-glass opacity and consolidation in both upper lobes and right lower lobe, favor an infectious or inflammatory etiology.  TTE 9/26 - EF 10-15% w/o regional wall motion abnormality. Grade 3 diastolic dysfunction. Moderate AR. Moderate MR. RV severely dilated w/ moderate reduction in systolic function. PASP 43 mmHg. No pericardial effusion.  RLE Venous Duplex 9/26 - no DVT or SVT  MRI spine 9/25 - incomplete exam, No evidence of significant spinal stenosis or cord compression in the cervical or visualized thoracic spine.   MRI L-spine 9/27 - suboptimal exam. No malignant or suspicious signal.  Echo repeat 10/8- no changes  SIGNIFICANT EVENTS: 9/25 - Admitted to hospital 9/25 - Right IJ CVC by ED 9/26 - Intubated for pending respiratory failure & transferred to [facility] 9/27 - EGD w/ erosive esophagitis 9/27 - FNA neck mass 9/30 - Self extubation during SBT 10/1 - Reintubated  10/4 cvvhd started 10/5- hd cathter out, new placed, cvvhd restarted 10/6- extubated 10/7 - not  Responding to dobutamine 10/11 hypotensive - neo added  ASSESSMENT / PLAN:  PULMONARY ETT 9/26>>>9/30 (self extubated)>10/1  A: Acute hypoxic  respiratory failure 2nd to HCAP with ARDS >> less likely pulmonary edema -resolved Hx of tobacco abuse. 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 Shock - combination of sepsis and cardiac. R/o  Relative adrenal insuff -cortisol dropped so significantly and on pressors weeks  Poor response dobutmaine P:  Levo titrate down if able Neo gtt, add vaso Ct stress steroids empiric  RENAL A: Acute Renal Failure >> likely from CHF, sepsis, and diuresis P:   cvvhd    GASTROINTESTINAL A: Shock liver - improving. Upper GIB - secondary to erosive esophagitis on EGD. Protein calorie malnutrition. R/o ileus hyperbili (lymphoma ), cholestasis likely, shock liver component  Loose stools, prior cdiff neg P:   ppi renal diet flexi seal, imodium    HEMATOLOGIC A: New dx of Hodgkin's lymphoma Coagulopathy >> resolved. Anemia of critical illness, and GI bleed. Thrombocytopenia. P:  SCDs   INFECTIOUS A: Resolved hcap C diff colitis P:   Vancomycin 9/25>>>off Zosyn 9/25>>>full course completed Zovirax for prophylaxis  Sputum 10/01 >>candida   PO vanc 10/12 >> Flagyl 10/12 >> Anticipate 14 ds of c diff therapy  ENDOCRINE A: Hyperglycemia. Rel AI P:   SSI if CBG > 180 Empiric steroids -start taper  NEUROLOGIC A: Delirium -resolved P:   PT if improves  Family - Palliative care following -wife Sharee Pimple involved Patient seems to be competent to make a decision about withdrawal of life support  COURSE - he remained in persistent shock - Cardiogenic rather than septic - not a  candidate for LVAD Cholestasis ? Related to shock liver vs cardiac congestion He required CVVHD for AKI After further palliative care discussions, he opted for comfort care and desired CRRT to be stopped. He passed away soon after pressors and inotropes were withdrawn  Cause of  death- cardiomyopathy, lymphoma, acute kidney injury, cardiogenic shock    Kara Mead MD. FCCP. [place] [facility] Pager [phone] If no response call [phone]

## 2015-10-31 DEATH — deceased

## 2016-03-13 IMAGING — CR DG CHEST 1V PORT
1 series · 1 of 1 positions shown · non-contrast
Comparison: 10/02/2015

CLINICAL DATA: Healthcare associated pneumonia.

EXAM:
PORTABLE CHEST 1 VIEW

[AP]
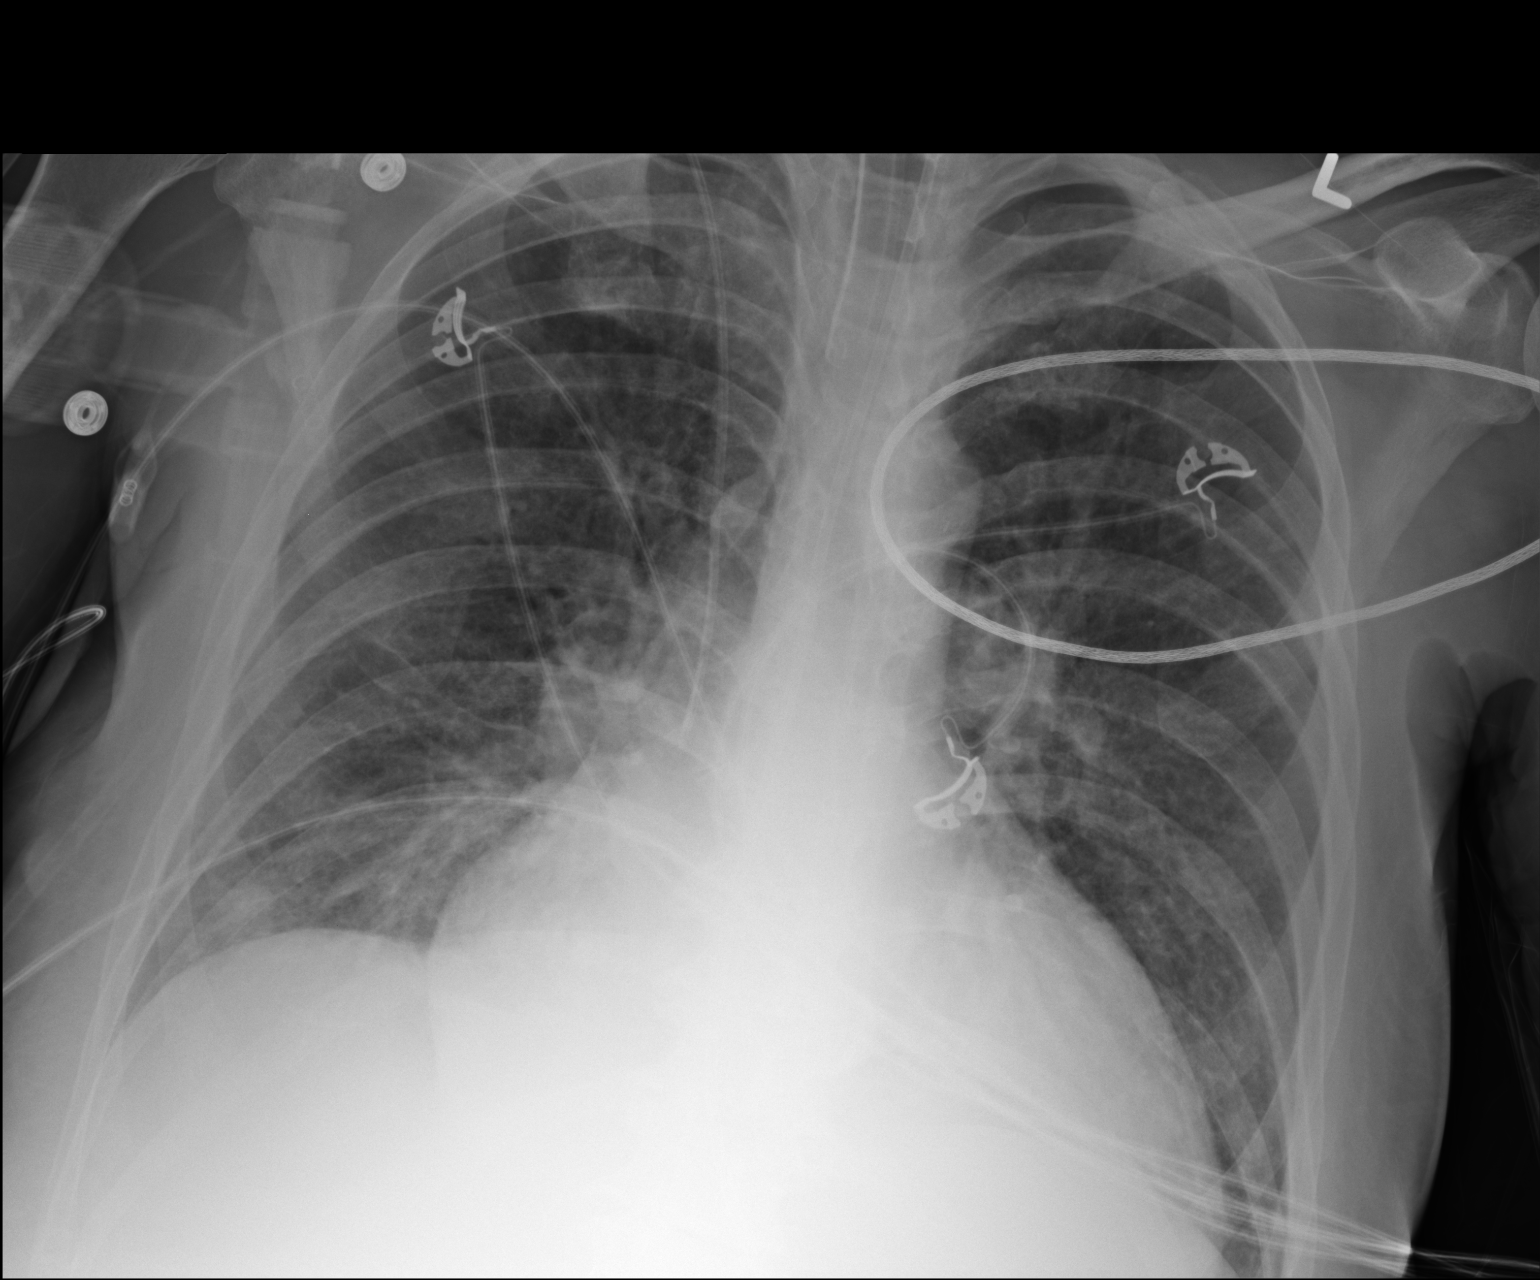

[1 of 1 positions shown; findings below may reference images not displayed]

FINDINGS: Support lines and tubes in appropriate position. Bilateral pulmonary
airspace disease shows slight improvement since previous study.
Cardiomegaly stable. No pneumothorax visualized.
IMPRESSION: Stable cardiomegaly. Slight improvement in diffuse bilateral
airspace disease.

## 2016-03-14 IMAGING — CR DG CHEST 1V PORT
1 series · 1 of 1 positions shown · non-contrast
Comparison: 10/04/2015

CLINICAL DATA: Status post dialysis catheter placement

EXAM:
PORTABLE CHEST - 1 VIEW

[AP]
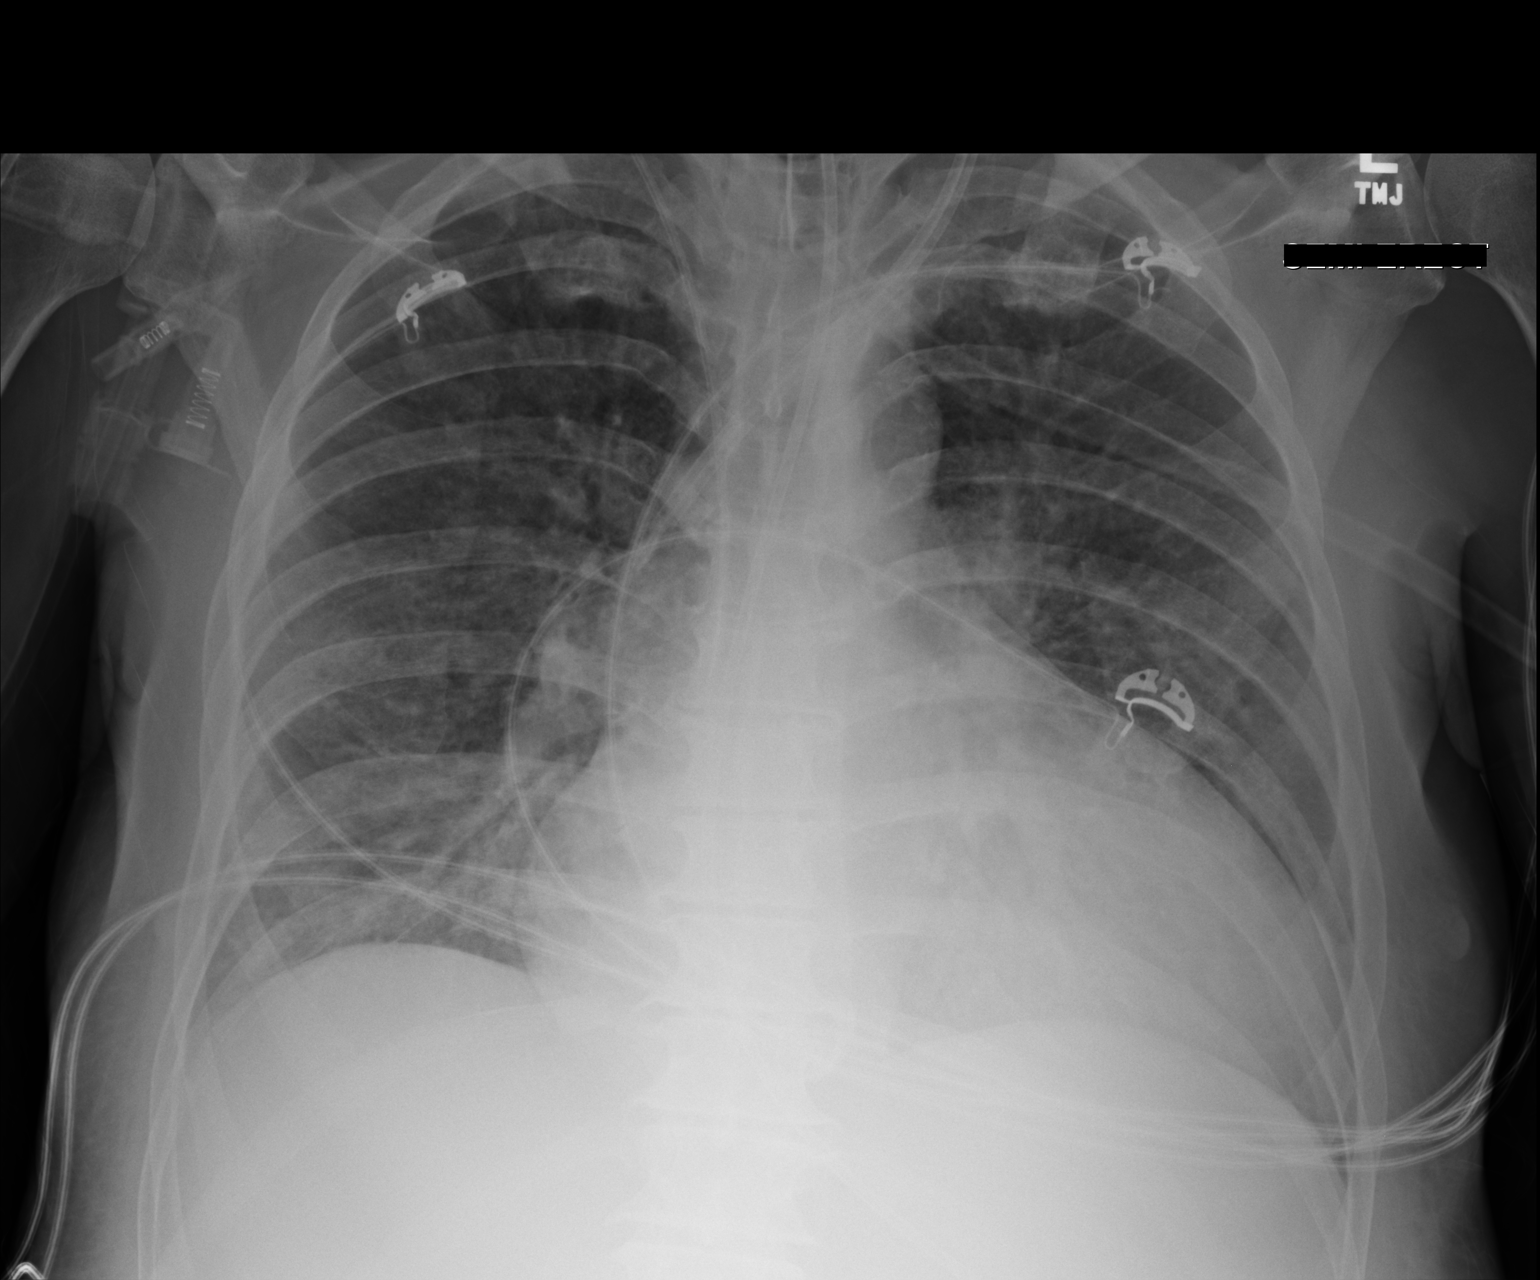

[1 of 1 positions shown; findings below may reference images not displayed]

FINDINGS: Cardiac shadow remains enlarged. An endotracheal tube, feeding
catheter, right jugular central line and left dialysis catheter are
again seen and stable. The tip of the dialysis catheter now lies
slightly deeper within the superior vena cava. No pneumothorax is
noted. Diffuse vascular congestion is again identified and stable.
Persistent bibasilar changes are noted.
IMPRESSION: Tip of the dialysis catheter is slightly deeper within the superior
vena cava. The remainder of the exam is unchanged

## 2016-03-14 IMAGING — CR DG CHEST 1V PORT
1 series · 1 of 1 positions shown · non-contrast
Comparison: 10/03/2015

CLINICAL DATA: ARDS

EXAM:
PORTABLE CHEST 1 VIEW

[AP]
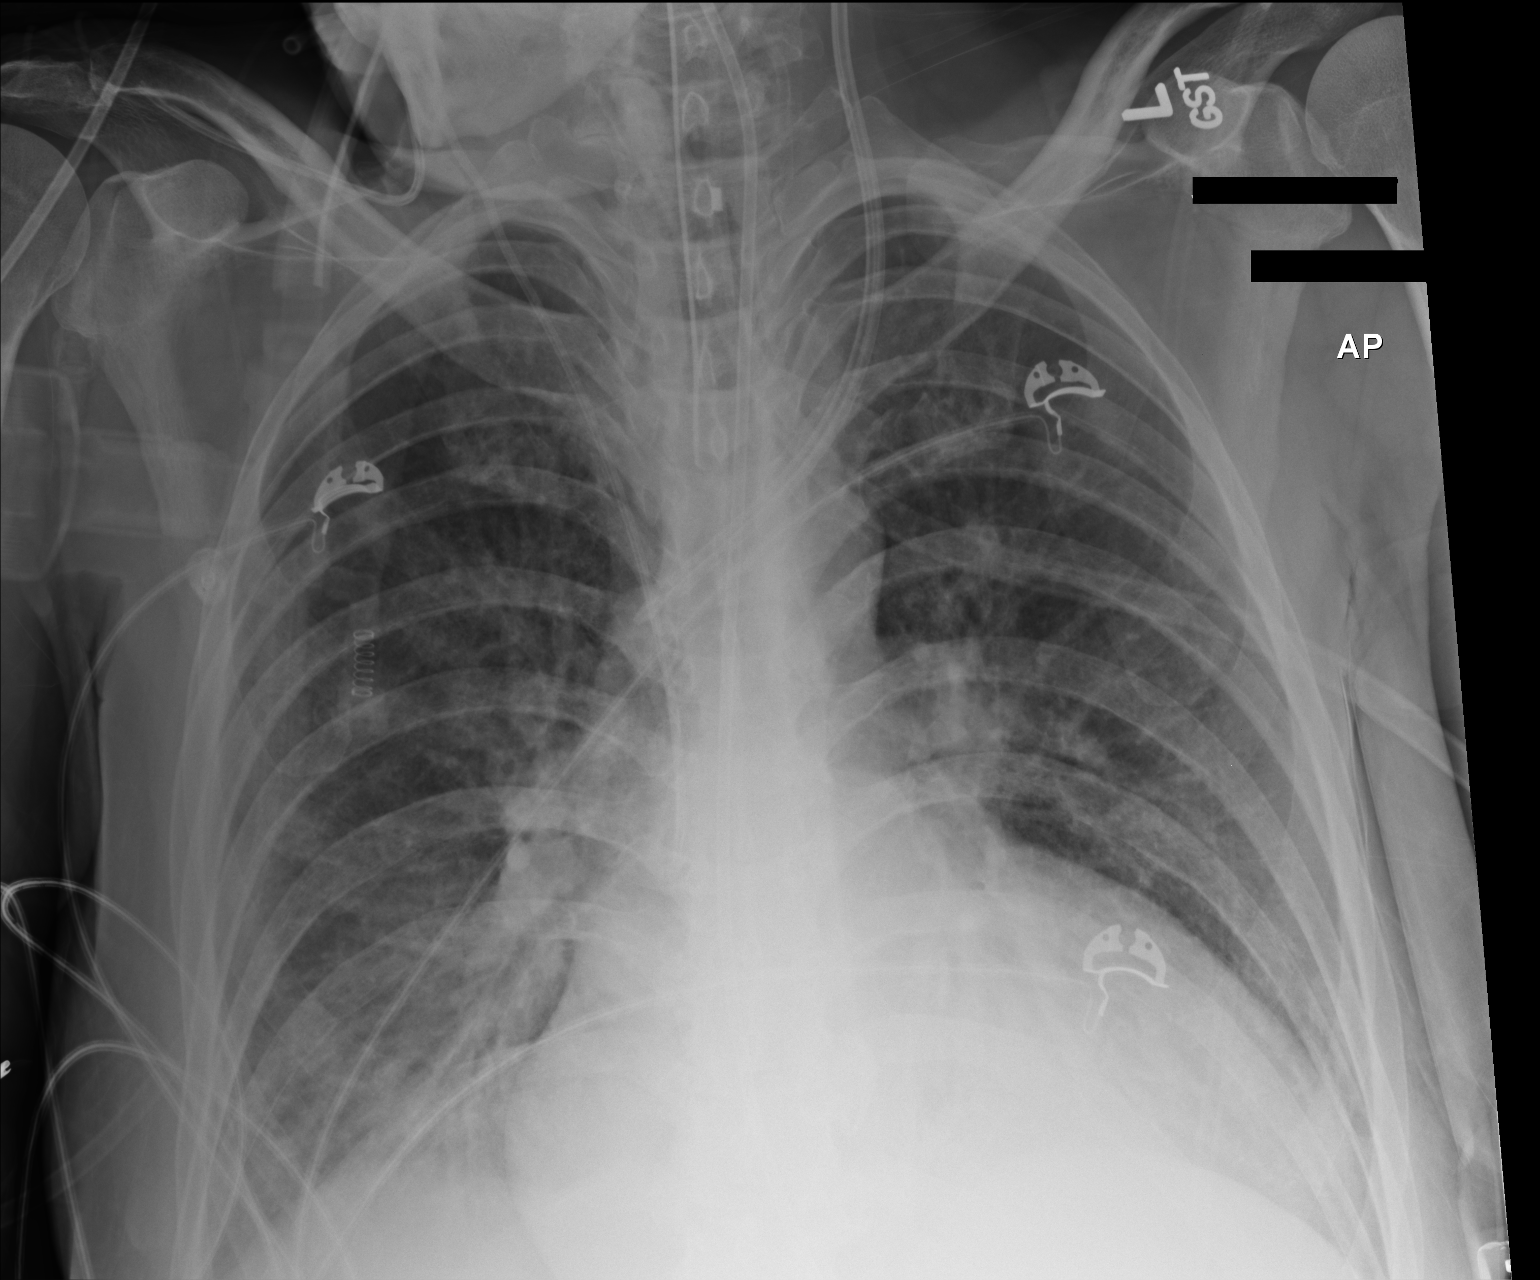

[1 of 1 positions shown; findings below may reference images not displayed]

FINDINGS: Endotracheal tube in good position. Right jugular catheter has been
advanced with tip in the lower SVC. Left jugular central venous
catheter tip in the SVC unchanged. Feeding tube enters the stomach
with the tip not visualized.

No pneumothorax

Progression of bibasilar airspace opacities. This may represent
atelectasis or pneumonia.

Cardiac enlargement with vascular congestion. Possible small right
effusion
IMPRESSION: Progression of bibasilar atelectasis/infiltrate. Possible small
right effusion.

## 2016-03-15 IMAGING — CR DG ABD PORTABLE 1V
1 series · 1 of 1 positions shown · non-contrast
Comparison: October 01, 2015

CLINICAL DATA: Vomiting

EXAM:
PORTABLE ABDOMEN - 1 VIEW

[AP]
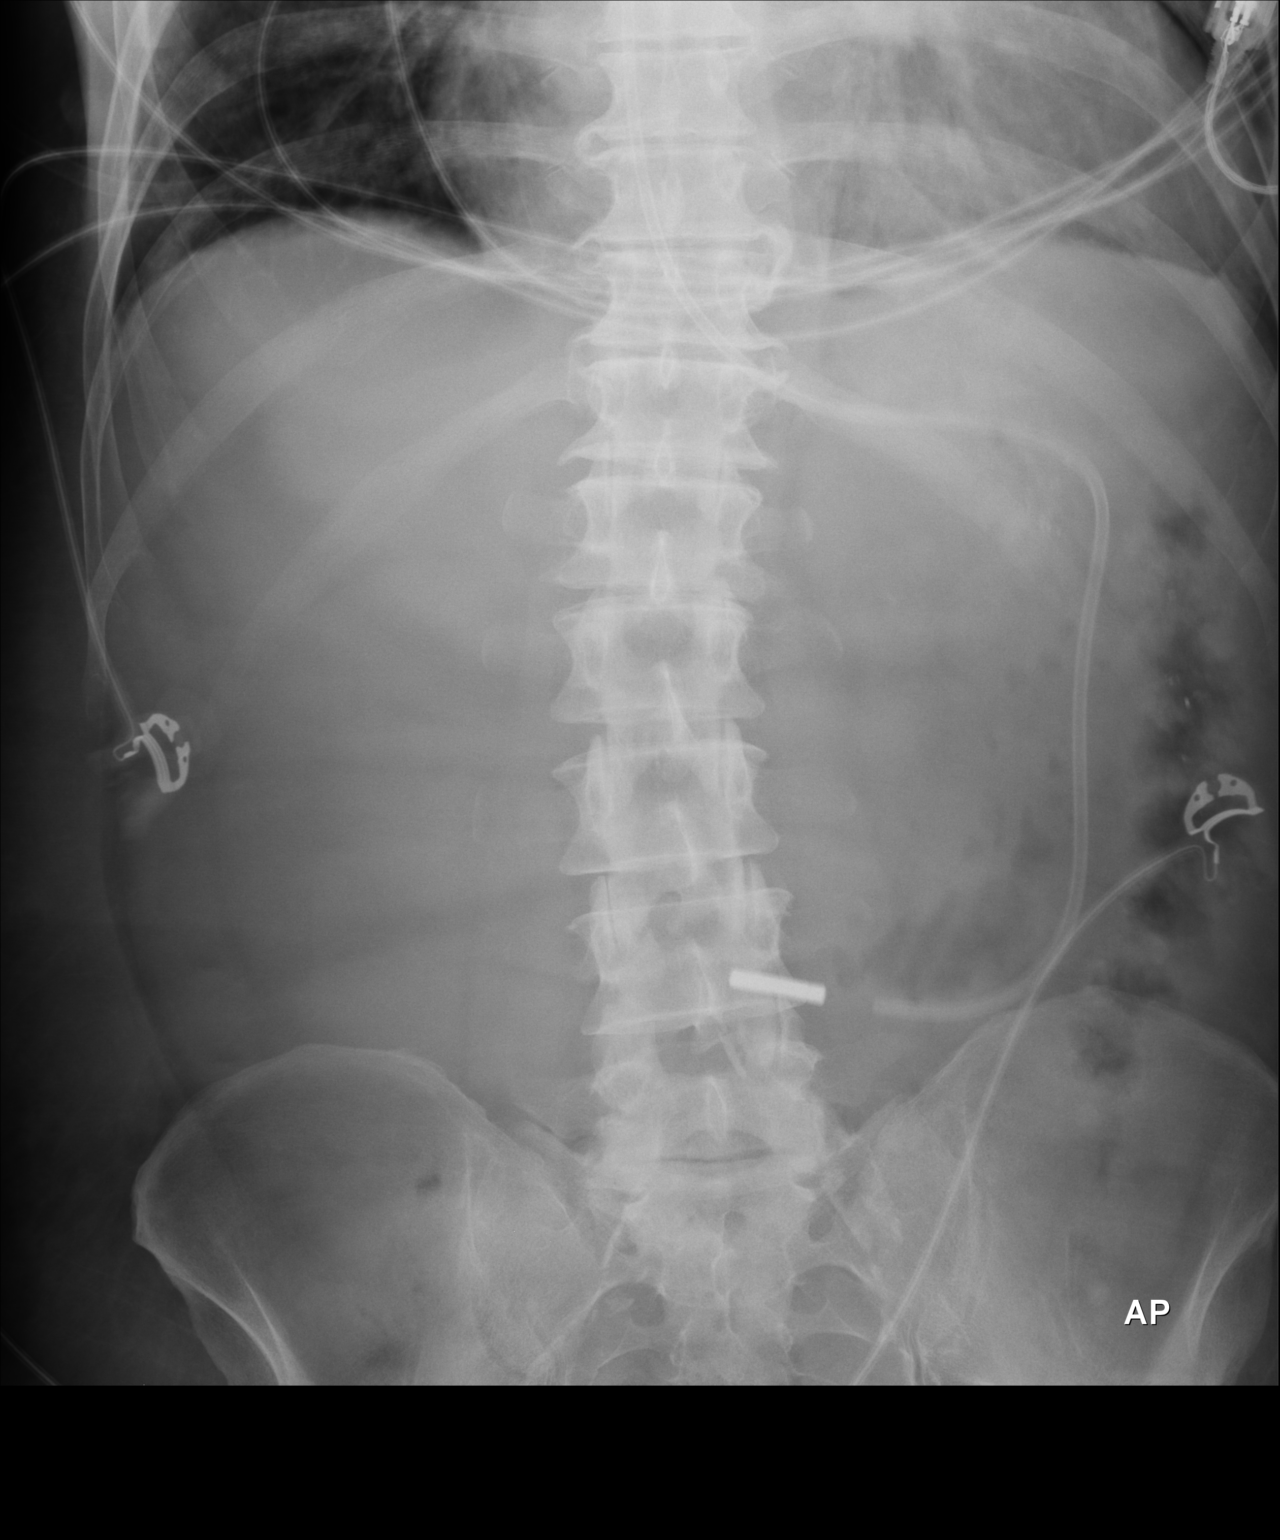

[1 of 1 positions shown; findings below may reference images not displayed]

FINDINGS: A feeding tube is identified with distal tip in the distal stomach.
There is relative paucity of gas gas, nonspecific. There is no
evidence of free air.
IMPRESSION: Feeding tube distal tip in the distal stomach. Relative paucity of
bowel gas, nonspecific.

## 2016-03-19 IMAGING — CR DG ABD PORTABLE 1V
1 series · 1 of 1 positions shown · non-contrast
Comparison: 10/08/2015 abdominal radiograph.

CLINICAL DATA: Worsening abdominal pain, nausea and vomiting.

EXAM:
PORTABLE ABDOMEN - 1 VIEW

[AP]
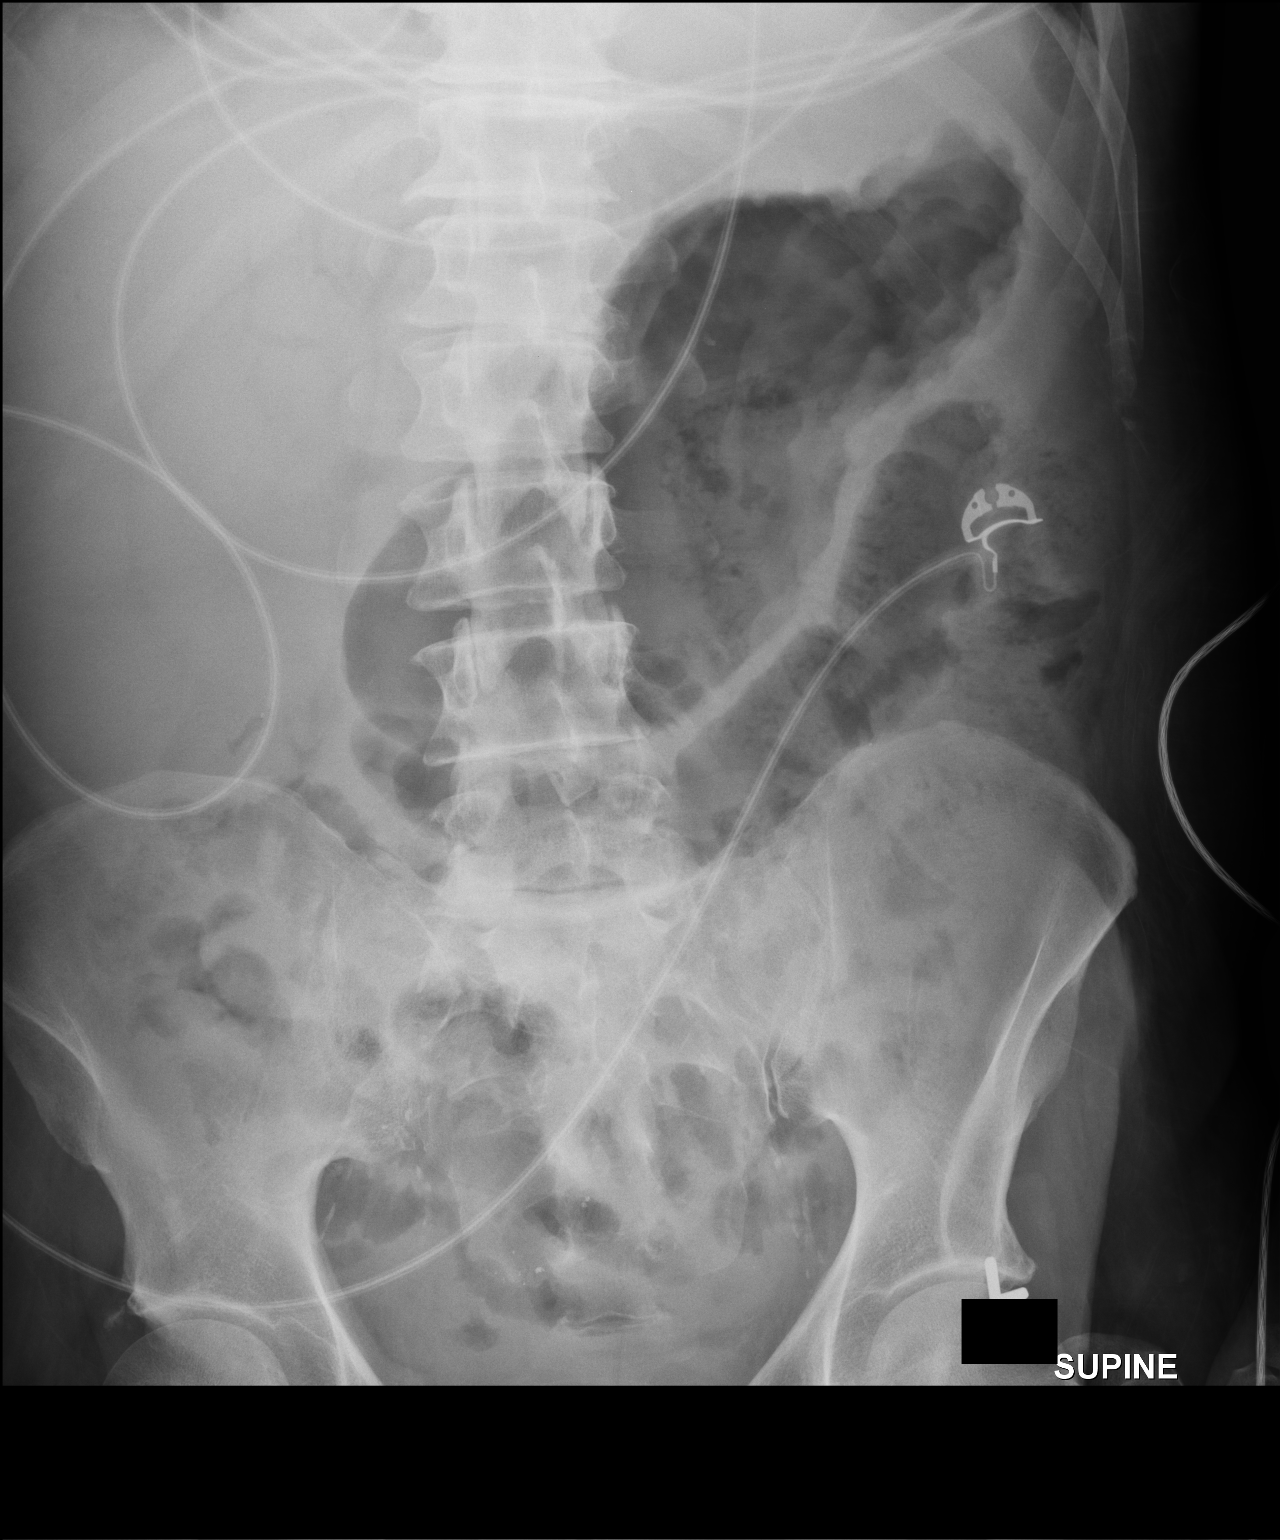

[1 of 1 positions shown; findings below may reference images not displayed]

FINDINGS: There is increasing mild gaseous distention of the stomach. There is
a new mildly dilated featureless bowel loop in the central and left
upper quadrant of the abdomen. There is a mildly dilated small bowel
loop in the right abdomen. Minimal stool throughout the colon.
Vascular calcifications are noted in the bilateral deep pelvis. No
evidence of pneumatosis or pneumoperitoneum.
IMPRESSION: Increasing mild gaseous distention of the stomach. New mildly
dilated small bowel loop in the right abdomen and additional new
mildly dilated featureless bowel loop in the central/left upper
abdomen. Findings are nonspecific and could indicate a mild adynamic
ileus or mild distal small bowel obstruction.

## 2016-06-19 ENCOUNTER — Other Ambulatory Visit: Payer: Self-pay | Admitting: Nurse Practitioner

## 2025-02-03 ENCOUNTER — Other Ambulatory Visit (HOSPITAL_COMMUNITY): Payer: Self-pay
